# Patient Record
Sex: Female | Born: 1980 | Race: White | Hispanic: No | Marital: Married
Health system: Southern US, Community
[De-identification: ages and names within clinical notes are randomized; demographics above are authoritative.]

## PROBLEM LIST (undated history)

## (undated) DIAGNOSIS — F329 Major depressive disorder, single episode, unspecified: Secondary | ICD-10-CM

## (undated) DIAGNOSIS — T1491XA Suicide attempt, initial encounter: Secondary | ICD-10-CM

## (undated) DIAGNOSIS — F1110 Opioid abuse, uncomplicated: Secondary | ICD-10-CM

## (undated) DIAGNOSIS — B182 Chronic viral hepatitis C: Secondary | ICD-10-CM

## (undated) DIAGNOSIS — F32A Depression, unspecified: Secondary | ICD-10-CM

## (undated) DIAGNOSIS — B1920 Unspecified viral hepatitis C without hepatic coma: Secondary | ICD-10-CM

## (undated) DIAGNOSIS — H539 Unspecified visual disturbance: Secondary | ICD-10-CM

## (undated) DIAGNOSIS — M797 Fibromyalgia: Secondary | ICD-10-CM

## (undated) DIAGNOSIS — F1010 Alcohol abuse, uncomplicated: Secondary | ICD-10-CM

## (undated) DIAGNOSIS — M1990 Unspecified osteoarthritis, unspecified site: Secondary | ICD-10-CM

## (undated) DIAGNOSIS — S32412A Displaced fracture of anterior wall of left acetabulum, initial encounter for closed fracture: Secondary | ICD-10-CM

## (undated) DIAGNOSIS — F419 Anxiety disorder, unspecified: Secondary | ICD-10-CM

## (undated) DIAGNOSIS — I10 Essential (primary) hypertension: Secondary | ICD-10-CM

## (undated) DIAGNOSIS — G8929 Other chronic pain: Secondary | ICD-10-CM

## (undated) HISTORY — PX: ABDOMINAL HYSTERECTOMY: SHX81

## (undated) HISTORY — DX: Unspecified viral hepatitis C without hepatic coma: B19.20

## (undated) HISTORY — DX: Unspecified visual disturbance: H53.9

## (undated) HISTORY — PX: APPENDECTOMY: SHX54

## (undated) HISTORY — PX: KNEE SURGERY: SHX244

## (undated) HISTORY — PX: CHOLECYSTECTOMY: SHX55

---

## 2012-11-18 DIAGNOSIS — M069 Rheumatoid arthritis, unspecified: Secondary | ICD-10-CM | POA: Insufficient documentation

## 2012-11-18 DIAGNOSIS — G43909 Migraine, unspecified, not intractable, without status migrainosus: Secondary | ICD-10-CM | POA: Insufficient documentation

## 2013-03-27 ENCOUNTER — Encounter (HOSPITAL_COMMUNITY): Payer: Self-pay | Admitting: *Deleted

## 2013-03-27 ENCOUNTER — Emergency Department (HOSPITAL_COMMUNITY)
Admission: EM | Admit: 2013-03-27 | Discharge: 2013-03-27 | Disposition: A | Discharge status: Home / Self Care | Payer: Self-pay | Attending: Emergency Medicine | Admitting: Emergency Medicine

## 2013-03-27 DIAGNOSIS — F17200 Nicotine dependence, unspecified, uncomplicated: Secondary | ICD-10-CM | POA: Insufficient documentation

## 2013-03-27 DIAGNOSIS — M549 Dorsalgia, unspecified: Secondary | ICD-10-CM | POA: Insufficient documentation

## 2013-03-27 MED ORDER — HYDROCODONE-ACETAMINOPHEN 5-325 MG PO TABS
1.0000 | ORAL_TABLET | Freq: Once | ORAL | Status: AC
  Administered 2013-03-27: 1 via ORAL
  Filled 2013-03-27: qty 1

## 2013-03-27 MED ORDER — CYCLOBENZAPRINE HCL 10 MG PO TABS
10.0000 mg | ORAL_TABLET | Freq: Once | ORAL | Status: AC
  Administered 2013-03-27: 10 mg via ORAL
  Filled 2013-03-27: qty 1

## 2013-03-27 MED ORDER — CYCLOBENZAPRINE HCL 10 MG PO TABS: 10.0000 mg | ORAL_TABLET | Freq: Two times a day (BID) | ORAL | Status: DC | PRN

## 2013-03-27 MED ORDER — HYDROCODONE-ACETAMINOPHEN 5-325 MG PO TABS: 1.0000 | ORAL_TABLET | ORAL | Status: DC | PRN

## 2013-03-27 NOTE — ED Provider Notes (Signed)
  CSN: [id number]     Arrival date & time 03/27/13  1936 History     First MD Initiated Contact with Patient 03/27/13 2035     No chief complaint on file.  (Consider location/radiation/quality/duration/timing/severity/associated sxs/prior Treatment) Patient is a 32 y.o. female presenting with back pain. The history is provided by the patient. No language interpreter was used.  Back Pain Location:  Thoracic spine Associated symptoms: no abdominal pain, no fever and no numbness   Associated symptoms comment:  Sharp pain in lower thoracic back that woke her from sleep last night. She has a history of spondylosis but current pain is in different location and has different presentation. No known injury. Pain is worse with movement, better with rest.    History reviewed. No pertinent past medical history. Past Surgical History  Procedure Laterality Date  . Cholecystectomy    . Appendectomy     History reviewed. No pertinent family history. History  Substance Use Topics  . Smoking status: Current Every Day Smoker  . Smokeless tobacco: Not on file  . Alcohol Use: Yes   OB History   Grav Para Term Preterm Abortions TAB SAB Ect Mult Living                 Review of Systems  Constitutional: Negative for fever and chills.  Gastrointestinal: Negative.  Negative for abdominal pain.  Musculoskeletal: Positive for back pain.  Skin: Negative.   Neurological: Negative.  Negative for numbness.    Allergies  Erythromycin; Lamictal; and Sumatriptan  Home Medications   Current Outpatient Rx  Name  Route  Sig  Dispense  Refill  . acetaminophen (TYLENOL) 500 MG tablet   Oral   Take 1,000 mg by mouth every 6 (six) hours as needed for pain.         Marland Kitchen ibuprofen (ADVIL,MOTRIN) 200 MG tablet   Oral   Take 400 mg by mouth every 6 (six) hours as needed for pain.          BP 177/100  Pulse 80  Temp(Src) 98.1 F (36.7 C) (Oral)  Resp 16  SpO2 100% Physical Exam  Constitutional: She  appears well-developed and well-nourished.  HENT:  Head: Normocephalic.  Neck: Normal range of motion. Neck supple.  Cardiovascular: Normal rate and regular rhythm.   Pulmonary/Chest: Effort normal and breath sounds normal.  Abdominal: Soft. Bowel sounds are normal. There is no tenderness. There is no rebound and no guarding.  Musculoskeletal: Normal range of motion.  Mild midline thoracic tenderness without swelling or discoloration.  Neurological: She is alert. She has normal reflexes. No cranial nerve deficit. Coordination normal.  Skin: Skin is warm and dry. No rash noted.  Psychiatric: She has a normal mood and affect.    ED Course   Procedures (including critical care time)  Labs Reviewed - No data to display No results found. No diagnosis found. 1. Back pain  MDM  Suspect uncomplicated muscular pain.  Arnoldo Hooker, PA-C 03/27/13 2112

## 2013-03-27 NOTE — ED Notes (Signed)
Pt states that she was stretching her back last night and she woke up this morning in pain in her mid back. Pt ambulatory. Denies problems with urine or bowel.

## 2013-03-28 NOTE — ED Provider Notes (Signed)
Medical screening examination/treatment/procedure(s) were performed by non-physician practitioner and as supervising physician I was immediately available for consultation/collaboration.   [place] Katich H Kendra Woolford, MD 03/28/13 0001 

## 2015-05-29 ENCOUNTER — Observation Stay (HOSPITAL_COMMUNITY)
Admission: EM | Admit: 2015-05-29 | Discharge: 2015-05-31 | Disposition: A | Discharge status: Home / Self Care | Payer: 59 | Attending: Internal Medicine | Admitting: Internal Medicine

## 2015-05-29 ENCOUNTER — Encounter (HOSPITAL_COMMUNITY): Payer: Self-pay | Admitting: *Deleted

## 2015-05-29 DIAGNOSIS — F10239 Alcohol dependence with withdrawal, unspecified: Secondary | ICD-10-CM | POA: Diagnosis not present

## 2015-05-29 DIAGNOSIS — F10930 Alcohol use, unspecified with withdrawal, uncomplicated: Secondary | ICD-10-CM

## 2015-05-29 DIAGNOSIS — B182 Chronic viral hepatitis C: Secondary | ICD-10-CM | POA: Insufficient documentation

## 2015-05-29 DIAGNOSIS — M1990 Unspecified osteoarthritis, unspecified site: Secondary | ICD-10-CM | POA: Diagnosis not present

## 2015-05-29 DIAGNOSIS — F10230 Alcohol dependence with withdrawal, uncomplicated: Secondary | ICD-10-CM

## 2015-05-29 DIAGNOSIS — F17210 Nicotine dependence, cigarettes, uncomplicated: Secondary | ICD-10-CM | POA: Diagnosis not present

## 2015-05-29 DIAGNOSIS — G8929 Other chronic pain: Secondary | ICD-10-CM | POA: Diagnosis not present

## 2015-05-29 DIAGNOSIS — K5900 Constipation, unspecified: Secondary | ICD-10-CM | POA: Insufficient documentation

## 2015-05-29 DIAGNOSIS — Z79891 Long term (current) use of opiate analgesic: Secondary | ICD-10-CM | POA: Diagnosis not present

## 2015-05-29 DIAGNOSIS — M797 Fibromyalgia: Secondary | ICD-10-CM | POA: Diagnosis not present

## 2015-05-29 DIAGNOSIS — E871 Hypo-osmolality and hyponatremia: Secondary | ICD-10-CM | POA: Insufficient documentation

## 2015-05-29 DIAGNOSIS — F10939 Alcohol use, unspecified with withdrawal, unspecified: Secondary | ICD-10-CM | POA: Diagnosis present

## 2015-05-29 HISTORY — DX: Fibromyalgia: M79.7

## 2015-05-29 HISTORY — DX: Other chronic pain: G89.29

## 2015-05-29 HISTORY — DX: Unspecified osteoarthritis, unspecified site: M19.90

## 2015-05-29 HISTORY — DX: Chronic viral hepatitis C: B18.2

## 2015-05-29 LAB — COMPREHENSIVE METABOLIC PANEL
ALT: 264 U/L — ABNORMAL HIGH (ref 14–54)
AST: 172 U/L — ABNORMAL HIGH (ref 15–41)
Albumin: 4 g/dL (ref 3.5–5.0)
Alkaline Phosphatase: 341 U/L — ABNORMAL HIGH (ref 38–126)
Anion gap: 9 (ref 5–15)
BUN: 9 mg/dL (ref 6–20)
CO2: 28 mmol/L (ref 22–32)
Calcium: 9.5 mg/dL (ref 8.9–10.3)
Chloride: 97 mmol/L — ABNORMAL LOW (ref 101–111)
Creatinine, Ser: 0.72 mg/dL (ref 0.44–1.00)
GFR calc Af Amer: >60 mL/min (ref >60)
GFR calc non Af Amer: >60 mL/min (ref >60)
Glucose, Bld: 95 mg/dL (ref 65–99)
Potassium: 4 mmol/L (ref 3.5–5.1)
Sodium: 134 mmol/L — ABNORMAL LOW (ref 135–145)
Total Bilirubin: 0.8 mg/dL (ref 0.3–1.2)
Total Protein: 7.6 g/dL (ref 6.5–8.1)

## 2015-05-29 LAB — CBC WITH DIFFERENTIAL/PLATELET
Basophils Absolute: 0 10*3/uL (ref 0.0–0.1)
Basophils Relative: 0 %
Eosinophils Absolute: 0.3 10*3/uL (ref 0.0–0.7)
Eosinophils Relative: 4 %
HCT: 45.1 % (ref 36.0–46.0)
Hemoglobin: 15.4 g/dL — ABNORMAL HIGH (ref 12.0–15.0)
Lymphocytes Relative: 25 %
Lymphs Abs: 1.4 10*3/uL (ref 0.7–4.0)
MCH: 32.8 pg (ref 26.0–34.0)
MCHC: 34.1 g/dL (ref 30.0–36.0)
MCV: 96 fL (ref 78.0–100.0)
Monocytes Absolute: 0.3 10*3/uL (ref 0.1–1.0)
Monocytes Relative: 6 %
Neutro Abs: 3.7 10*3/uL (ref 1.7–7.7)
Neutrophils Relative %: 65 %
Platelets: 165 10*3/uL (ref 150–400)
RBC: 4.7 MIL/uL (ref 3.87–5.11)
RDW: 12.6 % (ref 11.5–15.5)
WBC: 5.7 10*3/uL (ref 4.0–10.5)

## 2015-05-29 LAB — RAPID URINE DRUG SCREEN, HOSP PERFORMED
Amphetamines: NOT DETECTED
Barbiturates: NOT DETECTED
Benzodiazepines: NOT DETECTED
Cocaine: NOT DETECTED
Opiates: NOT DETECTED
Tetrahydrocannabinol: NOT DETECTED

## 2015-05-29 LAB — ETHANOL: Alcohol, Ethyl (B): <5 mg/dL (ref <5)

## 2015-05-29 MED ORDER — HALOPERIDOL LACTATE 5 MG/ML IJ SOLN
5.0000 mg | Freq: Once | INTRAMUSCULAR | Status: AC
  Administered 2015-05-29: 5 mg via INTRAMUSCULAR
  Filled 2015-05-29: qty 1

## 2015-05-29 MED ORDER — ADULT MULTIVITAMIN W/MINERALS CH
1.0000 | ORAL_TABLET | Freq: Every day | ORAL | Status: DC
Start: 2015-05-29 — End: 2015-05-29

## 2015-05-29 MED ORDER — THIAMINE HCL 100 MG/ML IJ SOLN
Freq: Once | INTRAVENOUS | Status: AC
  Administered 2015-05-29: 18:00:00 via INTRAVENOUS
  Filled 2015-05-29: qty 1000

## 2015-05-29 MED ORDER — LORAZEPAM 2 MG/ML IJ SOLN: 1.0000 mg | Freq: Four times a day (QID) | INTRAMUSCULAR | Status: DC | PRN

## 2015-05-29 MED ORDER — ENOXAPARIN SODIUM 40 MG/0.4ML ~~LOC~~ SOLN
40.0000 mg | SUBCUTANEOUS | Status: DC
  Administered 2015-05-29 – 2015-05-30 (×2): 40 mg via SUBCUTANEOUS
  Filled 2015-05-29 (×2): qty 0.4

## 2015-05-29 MED ORDER — INFLUENZA VAC SPLIT QUAD 0.5 ML IM SUSY
0.5000 mL | PREFILLED_SYRINGE | INTRAMUSCULAR | Status: DC
Start: 2015-05-30 — End: 2015-05-31
  Filled 2015-05-29: qty 0.5

## 2015-05-29 MED ORDER — THIAMINE HCL 100 MG/ML IJ SOLN
100.0000 mg | Freq: Every day | INTRAMUSCULAR | Status: DC
  Administered 2015-05-29: 100 mg via INTRAVENOUS
  Filled 2015-05-29: qty 2

## 2015-05-29 MED ORDER — LORAZEPAM 2 MG/ML IJ SOLN: 1.0000 mg | Freq: Once | INTRAMUSCULAR | Status: AC

## 2015-05-29 MED ORDER — VITAMIN B-1 100 MG PO TABS
100.0000 mg | ORAL_TABLET | Freq: Every day | ORAL | Status: DC
  Administered 2015-05-31: 100 mg via ORAL
  Filled 2015-05-29: qty 1

## 2015-05-29 MED ORDER — NICOTINE 21 MG/24HR TD PT24
21.0000 mg | MEDICATED_PATCH | Freq: Every day | TRANSDERMAL | Status: DC
  Administered 2015-05-29 – 2015-05-31 (×3): 21 mg via TRANSDERMAL
  Filled 2015-05-29 (×3): qty 1

## 2015-05-29 MED ORDER — VITAMIN B-1 100 MG PO TABS
100.0000 mg | ORAL_TABLET | Freq: Every day | ORAL | Status: DC
  Administered 2015-05-31: 100 mg via ORAL

## 2015-05-29 MED ORDER — LORAZEPAM 1 MG PO TABS
1.0000 mg | ORAL_TABLET | Freq: Four times a day (QID) | ORAL | Status: DC | PRN
  Administered 2015-05-29: 1 mg via ORAL
  Filled 2015-05-29: qty 1

## 2015-05-29 MED ORDER — METHADONE HCL 10 MG PO TABS
150.0000 mg | ORAL_TABLET | Freq: Every day | ORAL | Status: DC
  Administered 2015-05-29 – 2015-05-31 (×3): 150 mg via ORAL
  Filled 2015-05-29 (×3): qty 15

## 2015-05-29 MED ORDER — LORAZEPAM 2 MG/ML IJ SOLN
0.0000 mg | Freq: Four times a day (QID) | INTRAMUSCULAR | Status: DC
  Administered 2015-05-29: 4 mg via INTRAVENOUS
  Filled 2015-05-29: qty 2

## 2015-05-29 MED ORDER — HYDRALAZINE HCL 20 MG/ML IJ SOLN: 10.0000 mg | Freq: Four times a day (QID) | INTRAMUSCULAR | Status: DC | PRN

## 2015-05-29 MED ORDER — ADULT MULTIVITAMIN W/MINERALS CH
1.0000 | ORAL_TABLET | Freq: Every day | ORAL | Status: DC
Start: 2015-05-29 — End: 2015-05-31
  Administered 2015-05-29: 1 via ORAL
  Filled 2015-05-29 (×2): qty 1

## 2015-05-29 MED ORDER — ADULT MULTIVITAMIN W/MINERALS CH: 1.0000 | ORAL_TABLET | Freq: Every day | ORAL | Status: DC

## 2015-05-29 MED ORDER — VITAMIN B-1 100 MG PO TABS: 100.0000 mg | ORAL_TABLET | Freq: Every day | ORAL | Status: DC

## 2015-05-29 MED ORDER — ONDANSETRON HCL 4 MG PO TABS
4.0000 mg | ORAL_TABLET | Freq: Four times a day (QID) | ORAL | Status: DC | PRN
Start: 2015-05-29 — End: 2015-05-31

## 2015-05-29 MED ORDER — THIAMINE HCL 100 MG/ML IJ SOLN: 100.0000 mg | Freq: Every day | INTRAMUSCULAR | Status: DC

## 2015-05-29 MED ORDER — LORAZEPAM 2 MG/ML IJ SOLN: 0.0000 mg | Freq: Two times a day (BID) | INTRAMUSCULAR | Status: DC

## 2015-05-29 MED ORDER — ACETAMINOPHEN 325 MG PO TABS
650.0000 mg | ORAL_TABLET | Freq: Four times a day (QID) | ORAL | Status: DC | PRN
  Administered 2015-05-30: 650 mg via ORAL
  Filled 2015-05-29: qty 2

## 2015-05-29 MED ORDER — ONDANSETRON HCL 4 MG/2ML IJ SOLN: 4.0000 mg | Freq: Four times a day (QID) | INTRAMUSCULAR | Status: DC | PRN

## 2015-05-29 MED ORDER — LORAZEPAM 1 MG PO TABS: 1.0000 mg | ORAL_TABLET | Freq: Four times a day (QID) | ORAL | Status: DC | PRN

## 2015-05-29 MED ORDER — FOLIC ACID 1 MG PO TABS: 1.0000 mg | ORAL_TABLET | Freq: Every day | ORAL | Status: DC

## 2015-05-29 MED ORDER — FOLIC ACID 1 MG PO TABS
1.0000 mg | ORAL_TABLET | Freq: Every day | ORAL | Status: DC
  Administered 2015-05-31: 1 mg via ORAL
  Filled 2015-05-29: qty 1

## 2015-05-29 MED ORDER — LORAZEPAM 0.5 MG PO TABS
2.0000 mg | ORAL_TABLET | Freq: Once | ORAL | Status: AC
Start: 2015-05-29 — End: 2015-05-29
  Administered 2015-05-29: 2 mg via ORAL
  Filled 2015-05-29: qty 4

## 2015-05-29 MED ORDER — ACETAMINOPHEN 650 MG RE SUPP: 650.0000 mg | Freq: Four times a day (QID) | RECTAL | Status: DC | PRN

## 2015-05-29 MED ORDER — FOLIC ACID 1 MG PO TABS
1.0000 mg | ORAL_TABLET | Freq: Every day | ORAL | Status: DC
  Administered 2015-05-29: 1 mg via ORAL
  Filled 2015-05-29: qty 1

## 2015-05-29 NOTE — H&P (Signed)
[facility] History and Physical  Carla Park BJY:[id number] DOB: 01-Apr-1981 DOA: 05/29/2015  Referring physician: Ms. Trixie Dredge, PA EDP PCP: No primary care provider on file.  Specialists:   Chief Complaint: Alcohol withdrawal  HPI: Carla Park is a 34 y.o. female  With a history of chronic pain for which she goes to methadone clinic for detox, hepatitis C, who presented for alcohol withdrawal and delirium tremens. Patient states that on daily basis she typically drinks 9 glasses of wine or 2-3 500 mL boxes of wine.  Her last drink was this past Saturday, 05/26/2015.  She went to her methadone clinic today for treatment and was told that she could not be treated due to the possibility of seizure and was sent to the emergency department. Patient does express her desire to continue treatment through the clinic and to detox from alcohol. She did not realize that her alcohol consumption  "had gotten out of hand".  Currently patient does admit to feeling very anxious, restless, fidgety. She denies any chest pain, shortness of breath, nausea, vomiting or diarrhea or abdominal pain. Patient has not been having any hallucinations or confusion. She does complain of some constipation due to her methadone use. Reports her last bowel movement was yesterday. In the emergency department, patient's tox screen was negative, alcohol level was less than 5. Vital signs were normal except for elevated blood pressure. Patient was given several doses of Ativan. [facility] called for admission.  Review of Systems:  Constitutional: Denies fever, chills, diaphoresis, appetite change and fatigue.  HEENT: Denies photophobia, eye pain, redness, hearing loss, ear pain, congestion, sore throat, rhinorrhea, sneezing, mouth sores, trouble swallowing, neck pain, neck stiffness and tinnitus.   Respiratory: Denies SOB, DOE, cough, chest tightness,  and wheezing.   Cardiovascular: Denies chest pain, palpitations and leg  swelling.  Gastrointestinal: Complains of constipation. Genitourinary: Denies dysuria, urgency, frequency, hematuria, flank pain and difficulty urinating.  Musculoskeletal: Complains of painful arthritis in all of her joints. Skin: Denies pallor, rash and wound.  Neurological: Feels nervous and anxious, shaky, has tremors and headaches.  Hematological: Denies adenopathy. Easy bruising, personal or family bleeding history  Psychiatric/Behavioral: Feels shaky. Feels nervous  Past Medical History  Diagnosis Date  . Chronic pain   . Arthritis   . Fibromyalgia   . Hep C w/ coma, chronic (HCC)    Past Surgical History  Procedure Laterality Date  . Cholecystectomy    . Appendectomy    . Knee surgery     Social History:  reports that she has been smoking Cigarettes.  She has been smoking about 1.00 pack per day. She has never used smokeless tobacco. She reports that she drinks about 3.6 oz of alcohol per week. She reports that she does not use illicit drugs.   Allergies  Allergen Reactions  . Erythromycin Hives  . Lamictal [Lamotrigine] Dermatitis  . Sumatriptan Other (See Comments)    Becomes angry    Family History  Problem Relation Age of Onset  . Multiple sclerosis Mother     Prior to Admission medications   Medication Sig Start Date End Date Taking? Authorizing Provider  cyclobenzaprine (FLEXERIL) 10 MG tablet Take 1 tablet (10 mg total) by mouth 2 (two) times daily as needed for muscle spasms. 03/27/13   Elpidio Anis, PA-C  HYDROcodone-acetaminophen (NORCO/VICODIN) 5-325 MG per tablet Take 1 tablet by mouth every 4 (four) hours as needed for pain. 03/27/13   Elpidio Anis, PA-C  ibuprofen (ADVIL,MOTRIN) 200 MG tablet Take  400 mg by mouth every 6 (six) hours as needed for pain.    Historical Provider, MD   Physical Exam: Filed Vitals:   05/29/15 1542  BP: 131/78  Pulse: 74  Temp:   Resp: 20     General: Well developed, well nourished, NAD, appears stated  age  HEENT: NCAT, PERRLA, EOMI, Anicteic Sclera, mucous membranes moist.   Neck: Supple, no JVD, no masses  Cardiovascular: S1 S2 auscultated, no rubs, murmurs or gallops. Regular rate and rhythm.  Respiratory: Clear to auscultation bilaterally with equal chest rise  Abdomen: Soft, nontender, nondistended, + bowel sounds  Extremities: warm dry without cyanosis clubbing or edema  Neuro: AAOx3, cranial nerves grossly intact. Strength 5/5 in patient's upper and lower extremities bilaterally  Skin: Without rashes exudates or nodules, multiple tattoos  Psych: Anxious but pleasant  Labs on Admission:  Basic Metabolic Panel:  Recent Labs Lab 05/29/15 1519  NA 134*  K 4.0  CL 97*  CO2 28  GLUCOSE 95  BUN 9  CREATININE 0.72  CALCIUM 9.5   Liver Function Tests:  Recent Labs Lab 05/29/15 1519  AST 172*  ALT 264*  ALKPHOS 341*  BILITOT 0.8  PROT 7.6  ALBUMIN 4.0   No results for input(s): LIPASE, AMYLASE in the last 168 hours. No results for input(s): AMMONIA in the last 168 hours. CBC:  Recent Labs Lab 05/29/15 1519  WBC 5.7  NEUTROABS 3.7  HGB 15.4*  HCT 45.1  MCV 96.0  PLT 165   Cardiac Enzymes: No results for input(s): CKTOTAL, CKMB, CKMBINDEX, TROPONINI in the last 168 hours.  BNP (last 3 results) No results for input(s): BNP in the last 8760 hours.  ProBNP (last 3 results) No results for input(s): PROBNP in the last 8760 hours.  CBG: No results for input(s): GLUCAP in the last 168 hours.  Radiological Exams on Admission: No results found.  EKG: None  Assessment/Plan  Acute alcohol withdrawal -Will admit patient to medical floor for observation -Conduct neuro checks -Place on CIWA protocol, banana bag, followed by multivitamin, folic acid, thiamine -Alcohol level <5, tox screen negative -Alcohol cessation discussed  Accelerated hypertension -Will place on hydralazine -Likely complicated by withdrawal and pain  Chronic  pain/Fibromyalgia/Arthritis -Patient follows with the methadone clinic -Continue methadone 150 mg daily  Hepatitis C with elevated LFTs -Continue to monitor CMP -Patient currently does not have history of cirrhosis  Tobacco abuse -smoking cessation discussed -Will order nicotine patch  Mild hyponatremia -Continue to monitor CMP, IV fluids  Constipation -Likely secondary to methadone -Patient declined stool softener at this time  DVT prophylaxis: Lovenox  Code Status: Full  Condition: Guarded  Family Communication: None at bedside. Admission, patients condition and plan of care including tests being ordered have been discussed with the patient, who indicates understanding and agrees with the plan and Code Status.  Disposition Plan: Admitted for observation   Time spent: 60 minutes  Junior Huezo D.O. [facility] Pager [phone]  If 7PM-7AM, please contact night-coverage [email or web address] Password [email or web address] 05/29/2015, 4:29 PM

## 2015-05-29 NOTE — ED Notes (Signed)
PT requesting  Detox from ETOH

## 2015-05-29 NOTE — ED Notes (Signed)
UNABLE TO DRAW BLOOD FROM IV. PHLEBOTOMY REQUESTED.

## 2015-05-29 NOTE — ED Notes (Signed)
PT reports tremors from ETOH withdrawal . Pt reports she last had a drink was 2 days ago. Pt daily red wine intake wine boxes . Pt drinks 3 boxes a day. Pt has been sent to ED by the Methadone clinic for detox . The clinic did not dose today. Pt has been taking Methadone for 1 2/2 years.

## 2015-05-29 NOTE — ED Provider Notes (Signed)
CSN: [id number]     Arrival date & time 05/29/15  1204 History  By signing my name below, I, Essence Howell, attest that this documentation has been prepared under the direction and in the presence of Trixie Dredge, PA-C Electronically Signed: Charline Bills, ED Scribe 05/29/2015 at 3:45 PM.   CC: Alcohol withdrawl The history is provided by the patient. No language interpreter was used.   HPI Comments: Carla Park is a 34 y.o. female who presents to the Emergency Department for alcohol detox. Pt states that she typically drinks 2-3 500 mL wine boxes daily. Her last drink was 3 days ago when she had 2 500 mL boxes of wine. Pt states that she has been treated at the Methadone clinic for detox for the past 1.5 years. She went today for a Methadone treatment but was told that she could not be treated out-patient due to possibility of seizure. Her last dose of methadone was yesterday. Pt expresses her desire to continue treatment at the clinic and reports that she wants to detox from alcohol. Pt also reports withdrawal symptoms described as tremors, hot flashes, increased anxiety, 5/10 HA, fidgety and restlessness, unchanged chronic pain. She denies nausea, itching, burning, numbness/tingling, hallucinations, confusion.  Past Medical History  Diagnosis Date  . Chronic pain   . Arthritis   . Fibromyalgia   . Hep C w/ coma, chronic (HCC)    Past Surgical History  Procedure Laterality Date  . Cholecystectomy    . Appendectomy    . Knee surgery     History reviewed. No pertinent family history. Social History  Substance Use Topics  . Smoking status: Current Every Day Smoker -- 1.00 packs/day    Types: Cigarettes  . Smokeless tobacco: Never Used  . Alcohol Use: 3.6 oz/week    6 Glasses of wine per week     Comment: 500 ml boxes    OB History    No data available     Review of Systems  Constitutional: Positive for diaphoresis.  Gastrointestinal: Negative for nausea.  Neurological:  Positive for tremors and headaches. Negative for numbness.  Psychiatric/Behavioral: Negative for hallucinations and confusion. The patient is nervous/anxious.   All other systems reviewed and are negative.  Allergies  Erythromycin; Lamictal; and Sumatriptan  Home Medications   Prior to Admission medications   Medication Sig Start Date End Date Taking? Authorizing Provider  acetaminophen (TYLENOL) 500 MG tablet Take 1,000 mg by mouth every 6 (six) hours as needed for pain.    Historical Provider, MD  cyclobenzaprine (FLEXERIL) 10 MG tablet Take 1 tablet (10 mg total) by mouth 2 (two) times daily as needed for muscle spasms. 03/27/13   Elpidio Anis, PA-C  HYDROcodone-acetaminophen (NORCO/VICODIN) 5-325 MG per tablet Take 1 tablet by mouth every 4 (four) hours as needed for pain. 03/27/13   Elpidio Anis, PA-C  ibuprofen (ADVIL,MOTRIN) 200 MG tablet Take 400 mg by mouth every 6 (six) hours as needed for pain.    Historical Provider, MD   BP 187/100 mmHg  Pulse 68  Temp(Src) 98.3 F (36.8 C) (Oral)  Resp 20  Ht 5\' 2"  (1.575 m)  Wt 110 lb (49.896 kg)  BMI 20.11 kg/m2  SpO2 99% Physical Exam  Constitutional: She appears well-developed and well-nourished. No distress.  HENT:  Head: Normocephalic and atraumatic.  Neck: Neck supple.  Cardiovascular: Normal rate and regular rhythm.   Pulmonary/Chest: Effort normal and breath sounds normal. No respiratory distress. She has no wheezes. She has no rales.  Abdominal: Soft. She exhibits no distension. There is no tenderness. There is no rebound and no guarding.  Neurological: She is alert. She displays tremor.  Skin: She is not diaphoretic.  Psychiatric: Her mood appears anxious.  Nursing note and vitals reviewed.  ED Course  Procedures (including critical care time) DIAGNOSTIC STUDIES: Oxygen Saturation is 99% on RA, normal by my interpretation.    COORDINATION OF CARE: 12:57 PM-Discussed treatment plan which includes Ativan with pt at  bedside and pt agreed to plan.   Labs Review Labs Reviewed  COMPREHENSIVE METABOLIC PANEL - Abnormal; Notable for the following:    Sodium 134 (*)    Chloride 97 (*)    AST 172 (*)    ALT 264 (*)    Alkaline Phosphatase 341 (*)    All other components within normal limits  CBC WITH DIFFERENTIAL/PLATELET - Abnormal; Notable for the following:    Hemoglobin 15.4 (*)    All other components within normal limits  URINE RAPID DRUG SCREEN, HOSP PERFORMED  ETHANOL   Imaging Review No results found. I have personally reviewed and evaluated these images and lab results as part of my medical decision-making.   EKG Interpretation None       3:51 PM Checked on patient, still feeling very shaky and anxious.  Will redose ativan.  Is now on CIWA protocol.    MDM   Final diagnoses:  Alcohol withdrawal, uncomplicated (HCC)    Patient with alcohol dependence sent to ED from methadone clinic by her doctor for help with withdrawal.  CIWA score calculated at 15 by RN, 18 by me.  Per protocol and given patient's symptoms, plan for admission to hospital for ETOH withdrawal.  Labs significant for LFT elevation.  Admitted to [facility] as unassigned admission, Dr Catha Gosselin accepts admission.     I personally performed the services described in this documentation, which was scribed in my presence. The recorded information has been reviewed and is accurate.    Trixie Dredge, PA-C 05/29/15 1624  Medical screening examination/treatment/procedure(s) were performed by non-physician practitioner and as supervising physician I was immediately available for consultation/collaboration.   EKG Interpretation None        Melene Plan, DO 05/30/15 [phone]

## 2015-05-29 NOTE — Progress Notes (Signed)
Pt report received at 4:37pm from [facility]. Asked for repeat CIWA score before sending Pt to floor. RN called back at 4:50pm with updated CIWA score of 7.

## 2015-05-29 NOTE — Progress Notes (Signed)
Pt arrived to floor at 4:54pm

## 2015-05-29 NOTE — ED Notes (Signed)
Internal medicine at bedside

## 2015-05-30 DIAGNOSIS — G8929 Other chronic pain: Secondary | ICD-10-CM | POA: Diagnosis not present

## 2015-05-30 DIAGNOSIS — F10230 Alcohol dependence with withdrawal, uncomplicated: Secondary | ICD-10-CM | POA: Diagnosis not present

## 2015-05-30 DIAGNOSIS — B182 Chronic viral hepatitis C: Secondary | ICD-10-CM

## 2015-05-30 DIAGNOSIS — M797 Fibromyalgia: Secondary | ICD-10-CM | POA: Diagnosis not present

## 2015-05-30 DIAGNOSIS — F10231 Alcohol dependence with withdrawal delirium: Secondary | ICD-10-CM | POA: Diagnosis not present

## 2015-05-30 LAB — CBC
HCT: 44.4 % (ref 36.0–46.0)
Hemoglobin: 14.7 g/dL (ref 12.0–15.0)
MCH: 32.6 pg (ref 26.0–34.0)
MCHC: 33.1 g/dL (ref 30.0–36.0)
MCV: 98.4 fL (ref 78.0–100.0)
Platelets: 149 10*3/uL — ABNORMAL LOW (ref 150–400)
RBC: 4.51 MIL/uL (ref 3.87–5.11)
RDW: 12.9 % (ref 11.5–15.5)
WBC: 5.7 10*3/uL (ref 4.0–10.5)

## 2015-05-30 LAB — COMPREHENSIVE METABOLIC PANEL
ALT: 218 U/L — ABNORMAL HIGH (ref 14–54)
AST: 145 U/L — ABNORMAL HIGH (ref 15–41)
Albumin: 3.4 g/dL — ABNORMAL LOW (ref 3.5–5.0)
Alkaline Phosphatase: 272 U/L — ABNORMAL HIGH (ref 38–126)
Anion gap: 11 (ref 5–15)
BUN: 11 mg/dL (ref 6–20)
CO2: 27 mmol/L (ref 22–32)
Calcium: 9.4 mg/dL (ref 8.9–10.3)
Chloride: 100 mmol/L — ABNORMAL LOW (ref 101–111)
Creatinine, Ser: 0.79 mg/dL (ref 0.44–1.00)
GFR calc Af Amer: >60 mL/min (ref >60)
GFR calc non Af Amer: >60 mL/min (ref >60)
Glucose, Bld: 82 mg/dL (ref 65–99)
Potassium: 4 mmol/L (ref 3.5–5.1)
Sodium: 138 mmol/L (ref 135–145)
Total Bilirubin: 0.4 mg/dL (ref 0.3–1.2)
Total Protein: 6.5 g/dL (ref 6.5–8.1)

## 2015-05-30 MED ORDER — THIAMINE HCL 100 MG/ML IJ SOLN
Freq: Once | INTRAVENOUS | Status: AC
  Administered 2015-05-30: 10:00:00 via INTRAVENOUS
  Filled 2015-05-30: qty 1000

## 2015-05-30 NOTE — Progress Notes (Signed)
Patient no longer requiring Recruitment consultant. Sitter dc'd at this time. 05/30/2015 11:27 AM Carla Park

## 2015-05-30 NOTE — Progress Notes (Signed)
Patient was fidgiting and her routine lorazepan was administered at 2125. She was encouraged to sleep and give the medication a chance to work. Not long after medication was administered, patient got out of her bed, pulled her PIV that was started a few minutes earlier out. Walked out of her room and stated that, she was going home and she has just got off from work. On assessment, she was bleeding from the site. Pressure dressing was immediately applied to the site and she was re-directed to her room. She was reoriented to place and time. Patient was cleaned and helped to bed. Patient tried to sleep but become agitated and attempted to jump out of the bed with her eyes closed. Staff who were present at the bedside held patient and prevented her from falling. Patient began to hallucinate by speaking to individuals who were not present in the room and responding to their conversations. Staff woke patient up, laid her back in bed but patient will not stay in the bed. She continue to attempt to get out of bed. Roma Kayser Schorr, NP was notified and she ordered haldol 5 mg IM due to lack of IV access and patients agitation which has made it impossible to restart another IV. Medication was administered immediately and staff stayed with patient and offer support. Patients husband was notified and he called, spoke to patient and encouraged her to lay down and go to sleep. Patient was able to have a short conversation with her husband but fell asleep during the conversation then woke up and attempted to jump out of the bed. Patients husband was still on the phone at that time and he was asked if he could come and see patient but he indicated that, he has his son with him and that was going to make it difficult for him to come. Charge nurse and staff decided not to add extra burden to the family and husband was asked not to come. Staff stayed with patient till she fell asleep and comfort sitter come-in later to be with  patient.

## 2015-05-30 NOTE — Progress Notes (Signed)
[facility] PROGRESS NOTE  Carla Park QQV:[id number] DOB: March 21, 1981 DOA: 05/29/2015 PCP: No primary care provider on file.  HPI/Brief narrative Pt presented with a history of chronic pain for which she goes to methadone clinic for detox, hepatitis C, who presented for alcohol withdrawal and delirium tremens.  Assessment/Plan: Acute alcohol withdrawal -Conduct neuro checks -On CIWA protocol, banana bag, followed by multivitamin, folic acid, thiamine -Presenting alcohol level <5, tox screen negative -Alcohol cessation was reportedly discussed on admission  Accelerated hypertension -Continued on hydralazine -Better controlled -Likely fluctuations in BP complicated by withdrawal and pain  Chronic pain/Fibromyalgia/Arthritis -Patient follows with the methadone clinic -Continue methadone 150 mg daily  Hepatitis C with elevated LFTs -Continue to monitor CMP -LFT's slightly improved this AM -Patient currently does not have history of cirrhosis  Tobacco abuse -smoking cessation discussed -Will order nicotine patch  Mild hyponatremia -Resolved -Continue to monitor CMP, IV fluids  Constipation -Likely secondary to methadone -Patient declined stool softener at admission  Code Status: Full Family Communication: Pt in room Disposition Plan: Pending   Consultants:    Procedures:    Antibiotics: Anti-infectives    None       HPI/Subjective: Confused this AM  Objective: Filed Vitals:   05/30/15 0000 05/30/15 0400 05/30/15 0554 05/30/15 1419  BP:   123/81 135/88  Pulse: 80 74 64 71  Temp:   98.4 F (36.9 C) 97.7 F (36.5 C)  TempSrc:   Oral Oral  Resp:   18 16  Height:      Weight:      SpO2:   100% 98%    Intake/Output Summary (Last 24 hours) at 05/30/15 1422 Last data filed at 05/30/15 0900  Gross per 24 hour  Intake    360 ml  Output    600 ml  Net   -240 ml   Filed Weights   05/29/15 1218 05/29/15 1658  Weight: 49.896 kg (110 lb)  52.3 kg (115 lb 4.8 oz)    Exam:   General:  Asleep, in nad  Cardiovascular: regular, s1, s2  Respiratory: normal resp effort, no wheezing  Abdomen: soft,nondistended  Musculoskeletal: perfused, no clubbing   Data Reviewed: Basic Metabolic Panel:  Recent Labs Lab 05/29/15 1519 05/30/15 0625  NA 134* 138  K 4.0 4.0  CL 97* 100*  CO2 28 27  GLUCOSE 95 82  BUN 9 11  CREATININE 0.72 0.79  CALCIUM 9.5 9.4   Liver Function Tests:  Recent Labs Lab 05/29/15 1519 05/30/15 0625  AST 172* 145*  ALT 264* 218*  ALKPHOS 341* 272*  BILITOT 0.8 0.4  PROT 7.6 6.5  ALBUMIN 4.0 3.4*   No results for input(s): LIPASE, AMYLASE in the last 168 hours. No results for input(s): AMMONIA in the last 168 hours. CBC:  Recent Labs Lab 05/29/15 1519 05/30/15 0625  WBC 5.7 5.7  NEUTROABS 3.7  --   HGB 15.4* 14.7  HCT 45.1 44.4  MCV 96.0 98.4  PLT 165 149*   Cardiac Enzymes: No results for input(s): CKTOTAL, CKMB, CKMBINDEX, TROPONINI in the last 168 hours. BNP (last 3 results) No results for input(s): BNP in the last 8760 hours.  ProBNP (last 3 results) No results for input(s): PROBNP in the last 8760 hours.  CBG: No results for input(s): GLUCAP in the last 168 hours.  No results found for this or any previous visit (from the past 240 hour(s)).   Studies: No results found.  Scheduled Meds: . enoxaparin (LOVENOX) injection  40  mg Subcutaneous Q24H  . folic acid  1 mg Oral Daily  . Influenza vac split quadrivalent PF  0.5 mL Intramuscular Tomorrow-1000  . LORazepam  0-4 mg Intravenous Q6H   Followed by  . [START ON 05/31/2015] LORazepam  0-4 mg Intravenous Q12H  . methadone  150 mg Oral Daily  . multivitamin with minerals  1 tablet Oral Daily  . nicotine  21 mg Transdermal Daily  . thiamine  100 mg Oral Daily   Or  . thiamine  100 mg Intravenous Daily  . thiamine  100 mg Oral Daily   Continuous Infusions:   Principal Problem:   Alcohol withdrawal  (HCC) Active Problems:   Chronic pain   Fibromyalgia   Hep C w/ coma, chronic (HCC)    Carla Park K  [facility] Pager [phone]. If 7PM-7AM, please contact night-coverage at [email or web address], password [facility] 05/30/2015, 2:22 PM

## 2015-05-31 DIAGNOSIS — M797 Fibromyalgia: Secondary | ICD-10-CM | POA: Diagnosis not present

## 2015-05-31 DIAGNOSIS — F10231 Alcohol dependence with withdrawal delirium: Secondary | ICD-10-CM

## 2015-05-31 DIAGNOSIS — G8929 Other chronic pain: Secondary | ICD-10-CM | POA: Diagnosis not present

## 2015-05-31 DIAGNOSIS — B182 Chronic viral hepatitis C: Secondary | ICD-10-CM | POA: Diagnosis not present

## 2015-05-31 LAB — COMPREHENSIVE METABOLIC PANEL
ALT: 216 U/L — ABNORMAL HIGH (ref 14–54)
AST: 169 U/L — ABNORMAL HIGH (ref 15–41)
Albumin: 3.1 g/dL — ABNORMAL LOW (ref 3.5–5.0)
Alkaline Phosphatase: 222 U/L — ABNORMAL HIGH (ref 38–126)
Anion gap: 11 (ref 5–15)
BUN: 12 mg/dL (ref 6–20)
CO2: 26 mmol/L (ref 22–32)
Calcium: 9.3 mg/dL (ref 8.9–10.3)
Chloride: 101 mmol/L (ref 101–111)
Creatinine, Ser: 0.92 mg/dL (ref 0.44–1.00)
GFR calc Af Amer: >60 mL/min (ref >60)
GFR calc non Af Amer: >60 mL/min (ref >60)
Glucose, Bld: 135 mg/dL — ABNORMAL HIGH (ref 65–99)
Potassium: 3.9 mmol/L (ref 3.5–5.1)
Sodium: 138 mmol/L (ref 135–145)
Total Bilirubin: 0.5 mg/dL (ref 0.3–1.2)
Total Protein: 6.1 g/dL — ABNORMAL LOW (ref 6.5–8.1)

## 2015-05-31 NOTE — Progress Notes (Signed)
Pt DC'd to home ambulated to lobby with NT- husband picking her up.  IV removed. DC instructions given to PT. Belongings returned to pt. Skin intact.

## 2015-05-31 NOTE — Discharge Summary (Signed)
Physician Discharge Summary  Carla Park [facility]:[id number] DOB: Jan 29, 1981 DOA: 05/29/2015  PCP: No primary care provider on file.  Admit date: 05/29/2015 Discharge date: 05/31/2015  Time spent: 20 minutes  Recommendations for Outpatient Follow-up:  1. Establish and follow up with PCP in 1-2 weeks 2. Follow up with methadone clinic  Discharge Diagnoses:  Principal Problem:   Alcohol withdrawal (HCC) Active Problems:   Chronic pain   Fibromyalgia   Hep C w/ coma, chronic (HCC)  Discharge Condition: Improved   Diet recommendation: Regular diet  Filed Weights   05/29/15 1218 05/29/15 1658  Weight: 49.896 kg (110 lb) 52.3 kg (115 lb 4.8 oz)    History of present illness:  Please see admit h and p from 10/11 for details. Briefly, pt with a history of chronic pain for which she goes to methadone clinic for detox, hepatitis C, who presented for alcohol withdrawal and delirium tremens. Patient was admitted for further work up.  Hospital Course:  Acute alcohol withdrawal -Conduct neuro checks -On CIWA protocol, banana bag, followed by multivitamin, folic acid, thiamine -Presenting alcohol level <5, tox screen negative -Alcohol cessation was done on multiple occasions -Patient declines information regarding alcoholic programs and states she already has a program she is affiliated with  Accelerated hypertension -Continued on hydralazine -Better controlled -Likely fluctuations in BP complicated by withdrawal and pain  Chronic pain/Fibromyalgia/Arthritis -Patient follows with the methadone clinic -Continued methadone 150 mg daily  Hepatitis C with elevated LFTs -Continue to monitor CMP -LFT's steadily improved this admission -Patient currently does not have history of cirrhosis  Tobacco abuse -smoking cessation discussed -Given nicotine patch  Mild hyponatremia -Likely secondary to ETOH abuse -Resolved  Constipation -Likely secondary to methadone -Patient declined  stool softener at admission   Discharge Exam: Filed Vitals:   05/30/15 2131 05/30/15 2350 05/31/15 0531 05/31/15 0837  BP: 155/76 151/95 158/94 101/56  Pulse: 64 67 68 69  Temp:  98.9 F (37.2 C) 98.4 F (36.9 C) 98.2 F (36.8 C)  TempSrc:  Oral Oral Oral  Resp:  18 16 15   Height:      Weight:      SpO2:  97% 98% 97%    General: awake, in nad Cardiovascular: regular, s1, s2 Respiratory: normal resp effort, no wheezing  Discharge Instructions     Medication List    STOP taking these medications        cyclobenzaprine 10 MG tablet  Commonly known as:  FLEXERIL     HYDROcodone-acetaminophen 5-325 MG tablet  Commonly known as:  NORCO/VICODIN      TAKE these medications        methadone 10 MG/ML solution  Commonly known as:  DOLOPHINE  Take 150 mg by mouth daily.     multivitamin with minerals Tabs tablet  Take 1 tablet by mouth 3 (three) times daily.     NASACORT ALLERGY 24HR 55 MCG/ACT Aero nasal inhaler  Generic drug:  triamcinolone  Place 2 sprays into the nose daily.       Allergies  Allergen Reactions  . Erythromycin Hives  . Lamictal [Lamotrigine] Dermatitis  . Sumatriptan Other (See Comments)    Becomes angry   Follow-up Information    Schedule an appointment as soon as possible for a visit with Follow up with PCP in 1-2 weeks.   Why:  Hospital follow up       The results of significant diagnostics from this hospitalization (including imaging, microbiology, ancillary and laboratory) are listed below for  reference.    Significant Diagnostic Studies: No results found.  Microbiology: No results found for this or any previous visit (from the past 240 hour(s)).   Labs: Basic Metabolic Panel:  Recent Labs Lab 05/29/15 1519 05/30/15 0625 05/31/15 0650  NA 134* 138 138  K 4.0 4.0 3.9  CL 97* 100* 101  CO2 28 27 26   GLUCOSE 95 82 135*  BUN 9 11 12   CREATININE 0.72 0.79 0.92  CALCIUM 9.5 9.4 9.3   Liver Function Tests:  Recent  Labs Lab 05/29/15 1519 05/30/15 0625 05/31/15 0650  AST 172* 145* 169*  ALT 264* 218* 216*  ALKPHOS 341* 272* 222*  BILITOT 0.8 0.4 0.5  PROT 7.6 6.5 6.1*  ALBUMIN 4.0 3.4* 3.1*   No results for input(s): LIPASE, AMYLASE in the last 168 hours. No results for input(s): AMMONIA in the last 168 hours. CBC:  Recent Labs Lab 05/29/15 1519 05/30/15 0625  WBC 5.7 5.7  NEUTROABS 3.7  --   HGB 15.4* 14.7  HCT 45.1 44.4  MCV 96.0 98.4  PLT 165 149*   Cardiac Enzymes: No results for input(s): CKTOTAL, CKMB, CKMBINDEX, TROPONINI in the last 168 hours. BNP: BNP (last 3 results) No results for input(s): BNP in the last 8760 hours.  ProBNP (last 3 results) No results for input(s): PROBNP in the last 8760 hours.  CBG: No results for input(s): GLUCAP in the last 168 hours.   Signed:  CHIU, 07/29/15  [facility] 05/31/2015, 5:12 PM

## 2015-06-16 ENCOUNTER — Emergency Department (HOSPITAL_COMMUNITY)
Admission: EM | Admit: 2015-06-16 | Discharge: 2015-06-16 | Disposition: A | Discharge status: Home / Self Care | Payer: 59 | Attending: Emergency Medicine | Admitting: Emergency Medicine

## 2015-06-16 ENCOUNTER — Encounter (HOSPITAL_COMMUNITY): Payer: Self-pay | Admitting: Emergency Medicine

## 2015-06-16 DIAGNOSIS — R110 Nausea: Secondary | ICD-10-CM | POA: Diagnosis not present

## 2015-06-16 DIAGNOSIS — Z7951 Long term (current) use of inhaled steroids: Secondary | ICD-10-CM | POA: Diagnosis not present

## 2015-06-16 DIAGNOSIS — R51 Headache: Secondary | ICD-10-CM | POA: Diagnosis not present

## 2015-06-16 DIAGNOSIS — Z79891 Long term (current) use of opiate analgesic: Secondary | ICD-10-CM | POA: Insufficient documentation

## 2015-06-16 DIAGNOSIS — Z720 Tobacco use: Secondary | ICD-10-CM | POA: Diagnosis not present

## 2015-06-16 DIAGNOSIS — F10930 Alcohol use, unspecified with withdrawal, uncomplicated: Secondary | ICD-10-CM

## 2015-06-16 DIAGNOSIS — F419 Anxiety disorder, unspecified: Secondary | ICD-10-CM | POA: Insufficient documentation

## 2015-06-16 DIAGNOSIS — M1990 Unspecified osteoarthritis, unspecified site: Secondary | ICD-10-CM | POA: Insufficient documentation

## 2015-06-16 DIAGNOSIS — F10230 Alcohol dependence with withdrawal, uncomplicated: Secondary | ICD-10-CM | POA: Insufficient documentation

## 2015-06-16 DIAGNOSIS — R251 Tremor, unspecified: Secondary | ICD-10-CM | POA: Diagnosis not present

## 2015-06-16 DIAGNOSIS — G8929 Other chronic pain: Secondary | ICD-10-CM | POA: Diagnosis not present

## 2015-06-16 DIAGNOSIS — R443 Hallucinations, unspecified: Secondary | ICD-10-CM | POA: Insufficient documentation

## 2015-06-16 DIAGNOSIS — Z8619 Personal history of other infectious and parasitic diseases: Secondary | ICD-10-CM | POA: Diagnosis not present

## 2015-06-16 LAB — RAPID URINE DRUG SCREEN, HOSP PERFORMED
Amphetamines: NOT DETECTED
Barbiturates: NOT DETECTED
Benzodiazepines: NOT DETECTED
Cocaine: NOT DETECTED
Opiates: NOT DETECTED
Tetrahydrocannabinol: NOT DETECTED

## 2015-06-16 LAB — CBC WITH DIFFERENTIAL/PLATELET
Basophils Absolute: 0 10*3/uL (ref 0.0–0.1)
Basophils Relative: 0 %
Eosinophils Absolute: 0.1 10*3/uL (ref 0.0–0.7)
Eosinophils Relative: 2 %
HCT: 45.4 % (ref 36.0–46.0)
Hemoglobin: 15.9 g/dL — ABNORMAL HIGH (ref 12.0–15.0)
Lymphocytes Relative: 22 %
Lymphs Abs: 1.3 10*3/uL (ref 0.7–4.0)
MCH: 33.1 pg (ref 26.0–34.0)
MCHC: 35 g/dL (ref 30.0–36.0)
MCV: 94.4 fL (ref 78.0–100.0)
Monocytes Absolute: 0.4 10*3/uL (ref 0.1–1.0)
Monocytes Relative: 7 %
Neutro Abs: 4.1 10*3/uL (ref 1.7–7.7)
Neutrophils Relative %: 69 %
Platelets: 111 10*3/uL — ABNORMAL LOW (ref 150–400)
RBC: 4.81 MIL/uL (ref 3.87–5.11)
RDW: 12.5 % (ref 11.5–15.5)
WBC: 6 10*3/uL (ref 4.0–10.5)

## 2015-06-16 LAB — ETHANOL: Alcohol, Ethyl (B): <5 mg/dL (ref <5)

## 2015-06-16 MED ORDER — LORAZEPAM 1 MG PO TABS
1.0000 mg | ORAL_TABLET | Freq: Once | ORAL | Status: AC
  Administered 2015-06-16: 1 mg via ORAL
  Filled 2015-06-16: qty 1

## 2015-06-16 MED ORDER — ACETAMINOPHEN 325 MG PO TABS
325.0000 mg | ORAL_TABLET | Freq: Once | ORAL | Status: AC
  Administered 2015-06-16: 325 mg via ORAL
  Filled 2015-06-16: qty 1

## 2015-06-16 MED ORDER — ADULT MULTIVITAMIN W/MINERALS CH: 1.0000 | ORAL_TABLET | Freq: Three times a day (TID) | ORAL | Status: DC

## 2015-06-16 MED ORDER — VITAMIN B-1 100 MG PO TABS: 100.0000 mg | ORAL_TABLET | Freq: Every day | ORAL | Status: DC

## 2015-06-16 MED ORDER — CARBAMAZEPINE 200 MG PO TABS: ORAL_TABLET | ORAL | Status: DC

## 2015-06-16 MED ORDER — FOLIC ACID 1 MG PO TABS: 1.0000 mg | ORAL_TABLET | Freq: Every day | ORAL | Status: DC

## 2015-06-16 NOTE — Care Management Note (Signed)
Case Management Note  Patient Details  Name: Zakya Halabi MRN: [id number] Date of Birth: [birth year]  Subjective/Objective:  34 y.o. F currently seeking "Detox for Alcohol" until she can make it to her out pt appt on Monday with her Psychologist. She reports she is taking 150 mg Methadone Daily. Reported to PA-c that husband will not allow her to come back home. CM talked with her to assess her willingness to go to a Facility that could provide detox for both Alcohol and Methadone which she is resistant to at Present. Made CSW aware of need for referral as pt does not meet criteria at present for Obs/Inpatient admission at present. Clarified with both [facility] in [place] [phone]) and [facility] [place], [place] ([phone])- Neither accept pts on Methadone doses > 40 mg Daily.                     Action/Plan: Will be available for disposition and discharge needs. CSW aware and following.    Expected Discharge Date:                  Expected Discharge Plan:     In-House Referral:  Clinical Social Work  Discharge planning Services  CM Consult  Post Acute Care Choice:    Choice offered to:  Patient  DME Arranged:    DME Agency:     HH Arranged:    HH Agency:     Status of Service:  In process, will continue to follow  Medicare Important Message Given:    Date Medicare IM Given:    Medicare IM give by:    Date Additional Medicare IM Given:    Additional Medicare Important Message give by:     If discussed at Long Length of Stay Meetings, dates discussed:    Additional Comments:  Yvone Neu, RN 06/16/2015, 11:23 AM

## 2015-06-16 NOTE — Discharge Instructions (Signed)
Alcohol Withdrawal When a person who drinks a lot of alcohol stops drinking, he or she may go through alcohol withdrawal. Alcohol withdrawal causes problems. It can make you feel:  Tired (fatigued).  Sad (depressed).  Fearful (anxious).  Grouchy (irritable).  Not hungry.  Sick to your stomach (nauseous).  Shaky. It can also make you have:  Nightmares.  Trouble sleeping.  Trouble thinking clearly.  Mood swings.  Clammy skin.  Very bad sweating.  A very fast heartbeat.  Shaking that you cannot control (tremor).  Having a fever.  A fit of movements that you cannot control (seizure).  Confusion.  Throwing up (vomiting).  Feeling or seeing things that are not there (hallucinations). HOME CARE  Take medicines and vitamins only as told by your doctor.  Do not drink alcohol.  Have someone around in case you need help.  Drink enough fluid to keep your pee (urine) clear or pale yellow.  Think about joining a group to help you stop drinking. GET HELP IF:  Your problems get worse.  Your problems do not go away.  You cannot eat or drink without throwing up.  You are having a hard time not drinking alcohol.  You cannot stop drinking alcohol. GET HELP RIGHT AWAY IF:  You feel your heart beating differently than usual.  Your chest hurts.  You have trouble breathing.  You have very bad problems, like:  A fever.  A fit of movements that you cannot control.  Being very confused.  Feeling or seeing things that are not there.   This information is not intended to replace advice given to you by your health care provider. Make sure you discuss any questions you have with your health care provider.   Document Released: 01/21/2008 Document Revised: 08/25/2014 Document Reviewed: 05/23/2014 Elsevier Interactive Patient Education [employer]   Please use medication as directed, please follow-up with resources provided. If any new or worsening signs  or symptoms present please return to the emergency room for further evaluation and management.

## 2015-06-16 NOTE — ED Provider Notes (Signed)
CSN: [id number]     Arrival date & time 06/16/15  9379 History   First MD Initiated Contact with Patient 06/16/15 0902     Chief Complaint  Patient presents with  . Addiction Problem    HPI   34 year old female presents today for alcohol withdrawal. Patient reports she is an alcoholic, drinks proximately 1500 mL of wine daily. Patient reports that on 05/29/2015 she was seen in the emergency room for alcohol detox as recommended by her methadone clinic. She was CWAl of 15/18, was admitted for 2 days. She reports abstinence for approximately 1 week, but was unable to maintain sobriety. Patient reports difficulty with being surrounded by alcohol everwhere she goes. She had not followed up with outpatient treatment, reports that she has a appointment next week. At this time of evaluation patient reports that she's having mild headache, mild tremors, no nausea, vomiting, hallucinations, shortness of breath or chest pain, no abdominal pain, no diaphoresis. She does report increased anxiety, chronic pain, slight pins and needle sensation.  Patient reports that her husband informed her that she needed to "pack a bag and go to the hospital" and that he did not expect her to come home today. During my evaluation she reported that he was upset that she would not be staying at the hospital for detox. She states that she would like to have somewhere she could stay until Monday so that she doesn't have the urge to drink until she can see her care provider for medical management.   Past Medical History  Diagnosis Date  . Chronic pain   . Arthritis   . Fibromyalgia   . Hep C w/ coma, chronic (HCC)    Past Surgical History  Procedure Laterality Date  . Cholecystectomy    . Appendectomy    . Knee surgery    . Abdominal hysterectomy    . Cesarean section     Family History  Problem Relation Age of Onset  . Multiple sclerosis Mother    Social History  Substance Use Topics  . Smoking status: Current  Every Day Smoker -- 1.00 packs/day    Types: Cigarettes  . Smokeless tobacco: Never Used  . Alcohol Use: 3.6 oz/week    6 Glasses of wine per week     Comment: 500 ml boxes    OB History    No data available     Review of Systems  All other systems reviewed and are negative.   Allergies  Erythromycin; Lamictal; and Sumatriptan  Home Medications   Prior to Admission medications   Medication Sig Start Date End Date Taking? Authorizing Provider  methadone (DOLOPHINE) 10 MG/ML solution Take 150 mg by mouth daily.   Yes Historical Provider, MD  triamcinolone (NASACORT ALLERGY 24HR) 55 MCG/ACT AERO nasal inhaler Place 2 sprays into the nose daily.   Yes Historical Provider, MD  carbamazepine (TEGRETOL) 200 MG tablet 868m PO QD X 1D, then 6050mPO QD X 1D, then 40035mD X 1D, then 200m57m QD X 2D 06/16/15   JeffOkey Regal-C  folic acid (FOLVITE) 1 MG tablet Take 1 tablet (1 mg total) by mouth daily. 06/16/15   JeffOkey Regal-C  Multiple Vitamin (MULTIVITAMIN WITH MINERALS) TABS tablet Take 1 tablet by mouth 3 (three) times daily. 06/16/15   JeffOkey Regal-C  thiamine (VITAMIN B-1) 100 MG tablet Take 1 tablet (100 mg total) by mouth daily. 06/16/15   Carla Valbuena, PA-C   BP 157/95 mmHg  Pulse  65  Temp(Src) 98.8 F (37.1 C) (Oral)  Resp 16  Ht _0  (1.575 m)  Wt 120 lb (54.432 kg)  BMI 21.94 kg/m2  SpO2 98%   Physical Exam  Constitutional: She is oriented to person, place, and time. She appears well-developed and well-nourished. She appears distressed.  Anxious   HENT:  Head: Normocephalic and atraumatic.  Eyes: Conjunctivae are normal. Pupils are equal, round, and reactive to light. Right eye exhibits no discharge. Left eye exhibits no discharge. No scleral icterus.  Neck: Normal range of motion. No JVD present. No tracheal deviation present.  Cardiovascular: Normal rate, regular rhythm and intact distal pulses.  Exam reveals no gallop and no friction rub.    No murmur heard. Pulmonary/Chest: Effort normal and breath sounds normal. No stridor. No respiratory distress. She has no wheezes. She has no rales. She exhibits no tenderness.  Abdominal: Soft. There is no tenderness.  Neurological: She is alert and oriented to person, place, and time. She has normal strength. No cranial nerve deficit or sensory deficit. Coordination normal. GCS eye subscore is 4. GCS verbal subscore is 5. GCS motor subscore is 6.  Slight tremors   Skin: Skin is warm and dry. No rash noted. She is not diaphoretic. No erythema. No pallor.  Psychiatric: She has a normal mood and affect. Her behavior is normal. Judgment and thought content normal.  Nursing note and vitals reviewed.   ED Course  Procedures (including critical care time) Labs Review Labs Reviewed  CBC WITH DIFFERENTIAL/PLATELET - Abnormal; Notable for the following:    Hemoglobin 15.9 (*)    Platelets 111 (*)    All other components within normal limits  ETHANOL  URINE RAPID DRUG SCREEN, HOSP PERFORMED    Imaging Review No results found. I have personally reviewed and evaluated these images and lab results as part of my medical decision-making.   EKG Interpretation None      MDM   Final diagnoses:  Alcohol withdrawal, uncomplicated (HCC)    Labs: CBC, ethanol, CMP, urine rapid drug screen- no significant findings  Imaging:  Consults:CSW, CM  Therapeutics: Ativan  Discharge Meds: Folic acid, thiamine, multivitamin, carbamazepine  Assessment/Plan: Patient presents to the ED for alcohol detoxification. Patient reports last drink was yesterday, she has minimal symptoms on initial evaluation. She reports that her husband would like her admitted to the hospital for alcohol detoxification so that she may follow-up with outpatient services on Monday. Patient reports that she is also taking methadone, and reports that she does not want to stop taking this in order to go into alcohol treatment. She  reports the treatment facility that she would like to go to will not take her if she is in active withdrawal, she refused to go to any other facilities as they will not allow her to contiue taking methadone. Patient reports that "I'm not ready to give up methadone". Pt requesting to be held until Monday so that she doesn't have the urge to drink. Pt had an initial CIWA of 12 upon initial evaluation. She was given ativan in the ED with CIWA of 8, repeat CIWA of 10. Pt was recently admitted for severe withdrawals on the 11'th of this month, she did not follow up with outpatient resources. She does not have severe withdrawal symptoms throughout her stay here in the ED, her vital signs are reassuring. Pt will be given Carbamazepine taper as she has a history of substance abuse and currently taking methadone. She met with both  CM and CSW and was given numerous outpatient resources for follow up care. Pt is stable for discharge and understands the strict return precautions in the event worsening signs or symptoms present. She was in sound mind and verbalized her understanding and agreement to today's plan.           Okey Regal, PA-C 06/16/15 2047  Harvel Quale, MD 06/16/15 [phone]

## 2015-06-16 NOTE — ED Notes (Signed)
Daily ETOH user, states she is here to safely detox from ETOH. Last drink yesterday.

## 2015-06-16 NOTE — Social Work (Signed)
CSW met with patient and spoke with patient's husband on speakerphone while in the room. Patient stated that she came to ED to detox from alcohol because she has an appointment on Monday with her primary doctor in order to start medication to help with cravings. Patient states that she is concerned that she will use again if she goes home. CSW encouraged patient to utilize her community and personal supports to maintain abstinence. CSW shared this same information with patient's husband over the phone at patient's request. At the time I met with the physician he identified that patient did not meet criteria for detox admission. This CSW provided long list of inpatient and outpatient SA treatment and detox options. Husband stated that he gets out of work at [employer] and can pick up patient then.  Christene Lye MSW, [place]

## 2015-06-16 NOTE — ED Notes (Signed)
Patient had questions about prescriptions and about why she can't stay for detox. Notified PA Hedges. He went back to room to discuss with her.

## 2015-06-18 DIAGNOSIS — F1120 Opioid dependence, uncomplicated: Secondary | ICD-10-CM | POA: Diagnosis present

## 2016-02-07 ENCOUNTER — Ambulatory Visit (INDEPENDENT_AMBULATORY_CARE_PROVIDER_SITE_OTHER): Payer: Self-pay | Admitting: Urgent Care

## 2016-02-07 VITALS — BP 126/80 | HR 70 | Temp 98.1°F | Resp 16 | Ht 63.5 in | Wt 123.0 lb

## 2016-02-07 DIAGNOSIS — F1911 Other psychoactive substance abuse, in remission: Secondary | ICD-10-CM

## 2016-02-07 DIAGNOSIS — Z87898 Personal history of other specified conditions: Secondary | ICD-10-CM

## 2016-02-07 DIAGNOSIS — Z5181 Encounter for therapeutic drug level monitoring: Secondary | ICD-10-CM

## 2016-02-07 NOTE — Patient Instructions (Addendum)
Methadone tablets What is this medicine? METHADONE (METH a done) is a pain reliever. It is used to treat severe pain. The medicine is also used to prevent withdrawal symptoms in people addicted to other drugs. This medicine may be used for other purposes; ask your health care provider or pharmacist if you have questions. What should I tell my health care provider before I take this medicine? They need to know if you have any of these conditions: -adrenal gland problem (Addison's disease) -brain tumor -drug abuse or addiction -fast or irregular heartbeat -gallbladder disease -head injury -frequently drink alcohol-containing drinks -kidney disease or problems going to the bathroom -liver disease -low blood pressure -lung disease, asthma, COPD, or sleep apnea -mental problems -seizure disorder -thyroid disease -an unusual or allergic reaction to methadone, other opioid analgesics, other medicines, foods, dyes, or preservatives -pregnant or trying to get pregnant -breast-feeding How should I use this medicine? Take this medicine by mouth with a drink of water. If the medicine upsets your stomach, take it with food or milk. Follow the directions on the prescription label. Do not take more medicine than you are told to take. A special MedGuide will be given to you by the pharmacist with each prescription and refill. Be sure to read this information carefully each time. Talk to your pediatrician regarding the use of this medicine in children. Special care may be needed. Overdosage: If you think you have taken too much of this medicine contact a poison control center or emergency room at once. NOTE: This medicine is only for you. Do not share this medicine with others. What if I miss a dose? If you miss a dose, take it as soon as you can. If it is almost time for your next dose, take only that dose. Do not take double or extra doses. What may interact with this medicine? Do not take this  medicine with any of the following medications: -arsenic trioxide -certain antibiotics like clarithromycin, erythromycin, grepafloxacin, pentamidine, sparfloxacin, troleandomycin -certain medicines for fungal infections like fluconazole, itraconazole, ketoconazole, posaconazole, voriconazole -certain medicines for irregular heart beat like amiodarone, bretylium, disopyramide, dofetilide, dronedarone, procainamide, quinidine, sotalol -certain medicines for malaria like chloroquine, halofantrine -cisapride -droperidol -haloperidol -pimozide -ranolazine -rasagiline -selegiline -sertindole -thioridazine -ziprasidone This medicine may also interact with the following medications: -alcohol -alfuzosin -antihistamines for allergy, cough and cold -antiviral medicines for HIV or AIDS -certain antibiotics like gatifloxacin, gemifloxacin, levofloxacin, moxifloxacin, ofloxacin, telithromycin, rifampin, rifapentine -certain medicines for blood pressure -certain medicines for cancer like dasatinib, lapatinib, sunitinib -certain medicines for depression, anxiety, or psychotic disturbances -certain medicines for irregular heart beat like flecainide, propafenone -certain medicines for nausea or vomiting like dolasetron, ondansetron, palonosetron -certain medicines for seizures like carbamazepine, phenobarbital, phenytoin -certain medicines for sleep -certain medicines for sleep during surgery -certain medicines to numb the skin -desipramine -MAOIs like Carbex, Eldepryl, Marplan, Nardil, and Parnate -mefloquine -muscle relaxants -narcotic medicines for pain -octreotide -other medicines that prolong the QT interval (cause an abnormal heart rhythm) -peginterferon alfa-2b -phenothiazines like chlorpromazine, mesoridazine, prochlorperazine -St. John's wort -tacrolimus -tramadol -vardenafil -vorinostat This list may not describe all possible interactions. Give your health care provider a list of  all the medicines, herbs, non-prescription drugs, or dietary supplements you use. Also tell them if you smoke, drink alcohol, or use illegal drugs. Some items may interact with your medicine. What should I watch for while using this medicine? Tell your doctor or health care professional if your pain does not go away, if it gets worse,  or if you have new or a different type of pain. You may develop tolerance to the medicine. Tolerance means that you will need a higher dose of the medicine for pain relief. Tolerance is normal and is expected if you take this medicine for a long time. Talk to your family and the people you live with about the side effects of this medicine. Tell them to get you medical help right away if you are having trouble breathing, unusually loud snoring, or are too sleepy. Do not suddenly stop taking your medicine because you may develop a severe reaction. Your body becomes used to the medicine. This does NOT mean you are addicted. Addiction is a behavior related to getting and using a drug for a non-medical reason. If you have pain, you have a medical reason to take pain medicine. Your doctor will tell you how much medicine to take. If your doctor wants you to stop the medicine, the dose will be slowly lowered over time to avoid any side effects. You may get drowsy or dizzy when you first start taking this medicine or change doses. Do not drive, use machinery, or do anything that may be dangerous until you know how the medicine affects you. Stand or sit up slowly. There are different types of narcotic medicines (opiates) for pain. If you take more than one type at the same time, you may have more side effects. Give your health care provider a list of all medicines you use. Your doctor will tell you how much medicine to take. Do not take more medicine than directed. Call emergency for help if you have problems breathing. This medicine will cause constipation. Try to have a bowel movement  at least every 2 to 3 days. If you do not have a bowel movement for 3 days, call your doctor or health care professional. Your mouth may get dry. Chewing sugarless gum or sucking hard candy, and drinking plenty of water may help. Contact your doctor if the problem does not go away or is severe. What side effects may I notice from receiving this medicine? Side effects that you should report to your doctor or health care professional as soon as possible: -allergic reactions like skin rash, itching or hives, swelling of the face, lips, or tongue -breathing problems -chest pain -confusion -feeling faint or lightheaded, falls -hallucinations -loud snoring -unusually fast or slow heartbeat -unusually weak or tired Side effects that usually do not require medical attention (report to your doctor or health care professional if they continue or are bothersome): -nausea, vomiting -sweating This list may not describe all possible side effects. Call your doctor for medical advice about side effects. You may report side effects to FDA at 1-800-FDA-1088. Where should I keep my medicine? Keep out of the reach of children. This medicine can be abused. Keep your medicine in a safe place to protect it from theft. Do not share this medicine with anyone. Selling or giving away this medicine is dangerous and is against the law. Store at room temperature between 15 and 30 degrees C (59 and 86 degrees F). Keep container tightly closed. This medicine may cause accidental overdose and death if it is taken by other adults, children, or pets. Flush any unused medicine down the toilet to reduce the chance of harm. Do not use the medicine after the expiration date. NOTE: This sheet is a summary. It may not cover all possible information. If you have questions about this medicine, talk to your doctor,  pharmacist, or health care provider.    2016, [employer]. (2013-03-08 11:44:21)     IF you received an  x-ray today, you will receive an invoice from [facility]. Please contact [facility] at [phone] with questions or concerns regarding your invoice.   IF you received labwork today, you will receive an invoice from [employer]. Please contact [facility] at [phone] with questions or concerns regarding your invoice.   Our billing staff will not be able to assist you with questions regarding bills from these companies.  You will be contacted with the lab results as soon as they are available. The fastest way to get your results is to activate your My Chart account. Instructions are located on the last page of this paperwork. If you have not heard from [place] regarding the results in 2 weeks, please contact this office.

## 2016-02-07 NOTE — Progress Notes (Signed)
    MRN: [id number] DOB: 11-17-1980  Subjective:   Carla Park is a 35 y.o. female presenting for chief complaint of Need [facility] for methadone clinic  Patient is presenting to have an EKG performed for a methadone clinic. Denies chest pain, shob, heart palpitations. Her physician managing her methadone requested she have this done due to a seminar he completed suggesting she needed to have her ECG performed. Smokes 1ppd.  Ilynn has a current medication list which includes the following prescription(s): methadone and multivitamin with minerals. Also is allergic to erythromycin; lamictal; and sumatriptan.  Shabree  has a past medical history of Chronic pain; Arthritis; Fibromyalgia; and Hep C w/ coma, chronic (HCC). Also  has past surgical history that includes Cholecystectomy; Appendectomy; Knee surgery; Abdominal hysterectomy; and Cesarean section.  Objective:   Vitals: BP 148/74 mmHg  Pulse 70  Temp(Src) 98.1 F (36.7 C) (Oral)  Resp 16  Ht 5' 3.5" (1.613 m)  Wt 123 lb (55.792 kg)  BMI 21.44 kg/m2  SpO2 99%  Physical Exam  Constitutional: She is oriented to person, place, and time. She appears well-developed and well-nourished.  Cardiovascular: Normal rate, regular rhythm and intact distal pulses.  Exam reveals no gallop and no friction rub.   No murmur heard. Pulmonary/Chest: No respiratory distress. She has no wheezes. She has no rales.  Neurological: She is alert and oriented to person, place, and time.  Skin: Skin is warm and dry.  Psychiatric: She has a normal mood and affect.   ECG interpretation - normal sinus rhythm.  Assessment and Plan :   1. Medication monitoring encounter 2. History of substance abuse - ECG and physical exam is very reassuring. RTC as needed.  Wallis Bamberg, PA-C [facility] [phone] 02/07/2016 1:19 PM

## 2016-10-14 ENCOUNTER — Encounter (HOSPITAL_COMMUNITY): Payer: Self-pay | Admitting: Emergency Medicine

## 2016-10-14 ENCOUNTER — Emergency Department (HOSPITAL_COMMUNITY)
Admission: EM | Admit: 2016-10-14 | Discharge: 2016-10-14 | Disposition: A | Discharge status: Home / Self Care | Payer: Self-pay | Attending: Emergency Medicine | Admitting: Emergency Medicine

## 2016-10-14 ENCOUNTER — Emergency Department (HOSPITAL_COMMUNITY): Payer: Self-pay

## 2016-10-14 DIAGNOSIS — N12 Tubulo-interstitial nephritis, not specified as acute or chronic: Secondary | ICD-10-CM | POA: Insufficient documentation

## 2016-10-14 DIAGNOSIS — Z79899 Other long term (current) drug therapy: Secondary | ICD-10-CM | POA: Insufficient documentation

## 2016-10-14 DIAGNOSIS — F17210 Nicotine dependence, cigarettes, uncomplicated: Secondary | ICD-10-CM | POA: Insufficient documentation

## 2016-10-14 LAB — URINALYSIS, ROUTINE W REFLEX MICROSCOPIC
Bilirubin Urine: NEGATIVE
Glucose, UA: NEGATIVE mg/dL
Ketones, ur: NEGATIVE mg/dL
Nitrite: NEGATIVE
Protein, ur: 100 mg/dL — AB
Specific Gravity, Urine: 1.014 (ref 1.005–1.030)
pH: 5 (ref 5.0–8.0)

## 2016-10-14 LAB — LIPASE, BLOOD: Lipase: 24 U/L (ref 11–51)

## 2016-10-14 LAB — COMPREHENSIVE METABOLIC PANEL
ALT: 313 U/L — ABNORMAL HIGH (ref 14–54)
AST: 291 U/L — ABNORMAL HIGH (ref 15–41)
Albumin: 3.8 g/dL (ref 3.5–5.0)
Alkaline Phosphatase: 171 U/L — ABNORMAL HIGH (ref 38–126)
Anion gap: 11 (ref 5–15)
BUN: 12 mg/dL (ref 6–20)
CO2: 25 mmol/L (ref 22–32)
Calcium: 9.3 mg/dL (ref 8.9–10.3)
Chloride: 97 mmol/L — ABNORMAL LOW (ref 101–111)
Creatinine, Ser: 1.01 mg/dL — ABNORMAL HIGH (ref 0.44–1.00)
GFR calc Af Amer: >60 mL/min (ref >60)
GFR calc non Af Amer: >60 mL/min (ref >60)
Glucose, Bld: 143 mg/dL — ABNORMAL HIGH (ref 65–99)
Potassium: 4.1 mmol/L (ref 3.5–5.1)
Sodium: 133 mmol/L — ABNORMAL LOW (ref 135–145)
Total Bilirubin: 1.6 mg/dL — ABNORMAL HIGH (ref 0.3–1.2)
Total Protein: 7.2 g/dL (ref 6.5–8.1)

## 2016-10-14 LAB — CBC
HCT: 44.9 % (ref 36.0–46.0)
Hemoglobin: 15.3 g/dL — ABNORMAL HIGH (ref 12.0–15.0)
MCH: 31.2 pg (ref 26.0–34.0)
MCHC: 34.1 g/dL (ref 30.0–36.0)
MCV: 91.4 fL (ref 78.0–100.0)
Platelets: 141 10*3/uL — ABNORMAL LOW (ref 150–400)
RBC: 4.91 MIL/uL (ref 3.87–5.11)
RDW: 13.1 % (ref 11.5–15.5)
WBC: 13.6 10*3/uL — ABNORMAL HIGH (ref 4.0–10.5)

## 2016-10-14 LAB — PREGNANCY, URINE: Preg Test, Ur: NEGATIVE

## 2016-10-14 MED ORDER — AMOXICILLIN-POT CLAVULANATE 875-125 MG PO TABS: 1.0000 | ORAL_TABLET | Freq: Two times a day (BID) | ORAL | 0 refills | Status: DC

## 2016-10-14 MED ORDER — SODIUM CHLORIDE 0.9 % IV BOLUS (SEPSIS)
1000.0000 mL | Freq: Once | INTRAVENOUS | Status: AC
  Administered 2016-10-14: 1000 mL via INTRAVENOUS

## 2016-10-14 MED ORDER — ONDANSETRON HCL 4 MG/2ML IJ SOLN
4.0000 mg | Freq: Once | INTRAMUSCULAR | Status: AC | PRN
  Administered 2016-10-14: 4 mg via INTRAVENOUS
  Filled 2016-10-14: qty 2

## 2016-10-14 MED ORDER — IOPAMIDOL (ISOVUE-300) INJECTION 61%
INTRAVENOUS | Status: AC
  Administered 2016-10-14: 100 mL
  Filled 2016-10-14: qty 100

## 2016-10-14 MED ORDER — CEFTRIAXONE SODIUM 1 G IJ SOLR
1.0000 g | Freq: Once | INTRAMUSCULAR | Status: AC
  Administered 2016-10-14: 1 g via INTRAVENOUS
  Filled 2016-10-14: qty 10

## 2016-10-14 MED ORDER — ONDANSETRON 4 MG PO TBDP: 4.0000 mg | ORAL_TABLET | Freq: Three times a day (TID) | ORAL | 0 refills | Status: DC | PRN

## 2016-10-14 MED ORDER — NAPROXEN 250 MG PO TABS: 250.0000 mg | ORAL_TABLET | Freq: Two times a day (BID) | ORAL | 0 refills | Status: DC

## 2016-10-14 MED ORDER — ACETAMINOPHEN 325 MG PO TABS
650.0000 mg | ORAL_TABLET | Freq: Once | ORAL | Status: AC
  Administered 2016-10-14: 650 mg via ORAL
  Filled 2016-10-14: qty 2

## 2016-10-14 NOTE — ED Notes (Signed)
Pt returned from ct

## 2016-10-14 NOTE — ED Provider Notes (Signed)
[facility] DEPT Provider Note   CSN: [id number] Arrival date & time: 10/14/16  0277     History   Chief Complaint Chief Complaint  Patient presents with  . Flank Pain    HPI Carla Park is a 36 y.o. female.  Carla Park is a 36 y.o. Female with a history of substance abuse and chronic pain who presents to the ED complaining of UTI symptoms starting two days ago and bilateral low back pain starting yesterday. Patient reports she had pain with urination and dark urine starting 2 days ago. She reports a fever 101 yesterday with associated nausea, vomiting and bilateral low back pain. She reports the pain made her vomit. She reports taking Tylenol prior to arrival today. She denies history of recent UTIs. She denies history of previous pyelonephritis. She denies history of kidney stones. She takes methadone for her history of substance abuse. She reports vomiting the methadone up this morning. She denies coughing, chest pain, shortness of breath, abdominal pain, diarrhea, vaginal bleeding, or rashes.   The history is provided by the patient and medical records. No language interpreter was used.  Flank Pain  Pertinent negatives include no chest pain, no abdominal pain, no headaches and no shortness of breath.    Past Medical History:  Diagnosis Date  . Arthritis   . Chronic pain   . Fibromyalgia   . Hep C w/ coma, chronic [facility])     Patient Active Problem List   Diagnosis Date Noted  . Alcohol withdrawal (HCC) 05/29/2015  . Chronic pain   . Fibromyalgia   . Hep C w/ coma, chronic (HCC)     Past Surgical History:  Procedure Laterality Date  . ABDOMINAL HYSTERECTOMY    . APPENDECTOMY    . CESAREAN SECTION    . CHOLECYSTECTOMY    . KNEE SURGERY      OB History    No data available       Home Medications    Prior to Admission medications   Medication Sig Start Date End Date Taking? Authorizing Provider  acetaminophen (TYLENOL) 325 MG tablet Take 650 mg by  mouth every 6 (six) hours as needed for mild pain.   Yes Historical Provider, MD  ibuprofen (ADVIL,MOTRIN) 200 MG tablet Take 200 mg by mouth every 6 (six) hours as needed for moderate pain.   Yes Historical Provider, MD  methadone (DOLOPHINE) 10 MG/ML solution Take 150 mg by mouth daily.   Yes Historical Provider, MD  amoxicillin-clavulanate (AUGMENTIN) 875-125 MG tablet Take 1 tablet by mouth every 12 (twelve) hours. 10/14/16   Everlene Farrier, PA-C  Multiple Vitamin (MULTIVITAMIN WITH MINERALS) TABS tablet Take 1 tablet by mouth 3 (three) times daily. Patient not taking: Reported on 10/14/2016 06/16/15   Eyvonne Mechanic, PA-C  naproxen (NAPROSYN) 250 MG tablet Take 1 tablet (250 mg total) by mouth 2 (two) times daily with a meal. 10/14/16   Everlene Farrier, PA-C  ondansetron (ZOFRAN ODT) 4 MG disintegrating tablet Take 1 tablet (4 mg total) by mouth every 8 (eight) hours as needed for nausea or vomiting. 10/14/16   Everlene Farrier, PA-C    Family History Family History  Problem Relation Age of Onset  . Multiple sclerosis Mother     Social History Social History  Substance Use Topics  . Smoking status: Current Every Day Smoker    Packs/day: 1.00    Types: Cigarettes  . Smokeless tobacco: Never Used  . Alcohol use 3.6 oz/week    6 Glasses  of wine per week     Comment: 500 ml boxes      Allergies   Erythromycin; Lamictal [lamotrigine]; and Sumatriptan   Review of Systems Review of Systems  Constitutional: Positive for chills and fever.  HENT: Negative for congestion and sore throat.   Eyes: Negative for visual disturbance.  Respiratory: Negative for cough, shortness of breath and wheezing.   Cardiovascular: Negative for chest pain.  Gastrointestinal: Positive for nausea and vomiting. Negative for abdominal pain, blood in stool and diarrhea.  Genitourinary: Positive for dysuria, flank pain and hematuria. Negative for difficulty urinating, frequency, menstrual problem, pelvic pain,  vaginal bleeding and vaginal discharge.  Musculoskeletal: Positive for back pain. Negative for neck pain.  Skin: Negative for rash.  Neurological: Negative for dizziness, light-headedness and headaches.     Physical Exam Updated Vital Signs BP 123/82   Pulse 63   Temp 99.3 F (37.4 C) (Oral)   Resp 18   SpO2 100%   Physical Exam  Constitutional: She appears well-developed and well-nourished. No distress.  Nontoxic appearing.  HENT:  Head: Normocephalic and atraumatic.  Mouth/Throat: Oropharynx is clear and moist.  Eyes: Conjunctivae are normal. Pupils are equal, round, and reactive to light. Right eye exhibits no discharge. Left eye exhibits no discharge.  Neck: Neck supple.  Cardiovascular: Normal rate, regular rhythm, normal heart sounds and intact distal pulses.  Exam reveals no gallop and no friction rub.   No murmur heard. Pulmonary/Chest: Effort normal and breath sounds normal. No respiratory distress. She has no wheezes. She has no rales.  Abdominal: Soft. Bowel sounds are normal. She exhibits no distension and no mass. There is no tenderness. There is no rebound and no guarding.  Abdomen is soft and nontender to palpation. Bowel sounds are present. No CVA or flank tenderness. Patient has tenderness to her bilateral low back musculature. No midline neck or back tenderness.  Musculoskeletal: Normal range of motion. She exhibits tenderness. She exhibits no edema or deformity.  Patient has tenderness to her bilateral low back musculature. No CVA or flank tenderness. No midline neck or back tenderness. No overlying skin changes to her back.  Lymphadenopathy:    She has no cervical adenopathy.  Neurological: She is alert. No sensory deficit. Coordination normal.  Skin: Skin is warm and dry. Capillary refill takes less than 2 seconds. No rash noted. She is not diaphoretic. No erythema. No pallor.  Psychiatric: She has a normal mood and affect. Her behavior is normal.  Nursing  note and vitals reviewed.    ED Treatments / Results  Labs (all labs ordered are listed, but only abnormal results are displayed) Labs Reviewed  COMPREHENSIVE METABOLIC PANEL - Abnormal; Notable for the following:       Result Value   Sodium 133 (*)    Chloride 97 (*)    Glucose, Bld 143 (*)    Creatinine, Ser 1.01 (*)    AST 291 (*)    ALT 313 (*)    Alkaline Phosphatase 171 (*)    Total Bilirubin 1.6 (*)    All other components within normal limits  CBC - Abnormal; Notable for the following:    WBC 13.6 (*)    Hemoglobin 15.3 (*)    Platelets 141 (*)    All other components within normal limits  URINALYSIS, ROUTINE W REFLEX MICROSCOPIC - Abnormal; Notable for the following:    Color, Urine AMBER (*)    APPearance CLOUDY (*)    Hgb urine dipstick LARGE (*)  Protein, ur 100 (*)    Leukocytes, UA MODERATE (*)    Bacteria, UA MANY (*)    Squamous Epithelial / LPF 0-5 (*)    Non Squamous Epithelial 0-5 (*)    All other components within normal limits  URINE CULTURE  LIPASE, BLOOD  PREGNANCY, URINE    EKG  EKG Interpretation None       Radiology Ct Abdomen Pelvis W Contrast  Result Date: 10/14/2016 CLINICAL DATA:  Lower abdominal pain, bilateral flank pain nausea and vomiting, dark urine for about 2 days EXAM: CT ABDOMEN AND PELVIS WITH CONTRAST TECHNIQUE: Multidetector CT imaging of the abdomen and pelvis was performed using the standard protocol following bolus administration of intravenous contrast. CONTRAST:  ISOVUE-300 IOPAMIDOL (ISOVUE-300) INJECTION 61% COMPARISON:  None. FINDINGS: Lower chest: Lung bases shows no acute findings. Hepatobiliary: There is mild intrahepatic biliary ductal dilatation probable postcholecystectomy. Mild fatty infiltration of the liver. No focal hepatic mass. Mild CBD dilatation measures about 8 mm probable postcholecystectomy. No CBD filling defects are noted Pancreas: Unremarkable. No pancreatic ductal dilatation or surrounding  inflammatory changes. Spleen: Normal in size without focal abnormality. Adrenals/Urinary Tract: No adrenal gland mass. The kidneys show symmetrical enhancement. There is triangular-shaped area of decreased attenuation in upper pole of the right kidney medially please see coronal image 91. Similar small area are noted in upper pole of the right kidney laterally axial image 31. Also area of decreased attenuation lower pole of the right kidney coronal image 81. There is area of decreased attenuation in lower pole of the left kidney anteriorly axial image 40. Small second area of decreased attenuation in lower pole of the left kidney laterally axial image 39. Small focal area of decreased attenuation in upper pole of the left kidney medially axial image 29. Findings are consistent with bilateral multifocal pyelonephritis. There is subtle mild enhancement of the proximal right ureter. Superimpose right retracted infection/inflammation cannot be excluded. No hydronephrosis or hydroureter. Stomach/Bowel: No gastric outlet obstruction. Mild fluid distended small bowel loops with some fluid in gas in mid lower abdomen probable mild ileus less likely enteritis. No definite evidence of small bowel obstruction. No transition point in caliber of small bowel. Moderate stool noted in right colon. Moderate to abundant stool noted within transverse colon. Moderate stool noted descending colon and sigmoid colon. Moderate gas noted within rectum. No definite evidence of acute colitis or diverticulitis. No pericecal inflammation. The patient is status post appendectomy. Vascular/Lymphatic: No aortic aneurysm. A right retroperitoneal aortocaval lymph node measures 9 mm short-axis probable reactive. A left lateral retro carinal lymph node measures 9 mm short-axis probable reactive. No mesenteric adenopathy. Please note there is retroaortic left renal vein Reproductive: The patient is status post history Other: No ascites or free  abdominal air. The urinary bladder is unremarkable. No bladder filling defects are noted. Musculoskeletal: No destructive bony lesions are noted. Sagittal images of the spine are unremarkable. IMPRESSION: 1. Bilateral kidney shows multiple area of triangular-shaped decreased parenchymal attenuation consistent with multifocal bilateral pyelonephritis. 2. Subtle mild enhancement of the wall of proximal right ureter. Associated right urinary tract inflammation/infection cannot be excluded. No hydronephrosis or hydroureter. 3. No aortic aneurysm. Retroaortic left renal vein. There is borderline retroperitoneal adenopathy probable reactive. 4. Minimal fluid distended small bowel loops in mid abdomen probable mild ileus or enteritis. No definite evidence of small bowel obstruction. 5. Moderate stool throughout the colon. No pericecal inflammation. Status post appendectomy. No definite evidence of acute colitis or diverticulitis. 6. Status  post hysterectomy. 7. No ascites or free abdominal air. 8. Fatty infiltration of the liver.  Status post cholecystectomy. These results were called by telephone at the time of interpretation on 10/14/2016 at 12:29 pm to Dr. Everlene Farrier , who verbally acknowledged these results. Electronically Signed   By: Natasha Mead M.D.   On: 10/14/2016 12:30    Procedures Procedures (including critical care time)  Medications Ordered in ED Medications  ondansetron (ZOFRAN) injection 4 mg (4 mg Intravenous Given 10/14/16 0907)  sodium chloride 0.9 % bolus 1,000 mL (0 mLs Intravenous Stopped 10/14/16 1045)  cefTRIAXone (ROCEPHIN) 1 g in dextrose 5 % 50 mL IVPB (0 g Intravenous Stopped 10/14/16 1115)  acetaminophen (TYLENOL) tablet 650 mg (650 mg Oral Given 10/14/16 1052)  iopamidol (ISOVUE-300) 61 % injection (100 mLs  Contrast Given 10/14/16 1159)     Initial Impression / Assessment and Plan / ED Course  I have reviewed the triage vital signs and the nursing notes.  Pertinent labs &  imaging results that were available during my care of the patient were reviewed by me and considered in my medical decision making (see chart for details).    This is a 36 y.o. Female with a history of substance abuse and chronic pain who presents to the ED complaining of UTI symptoms starting two days ago and bilateral low back pain starting yesterday. Patient reports she had pain with urination and dark urine starting 2 days ago. She reports a fever 101 yesterday with associated nausea, vomiting and bilateral low back pain. She reports the pain made her vomit. She reports taking Tylenol prior to arrival today. She denies history of recent UTIs. She denies history of previous pyelonephritis. She denies history of kidney stones. She takes methadone for her history of substance abuse.  On exam the patient is afebrile nontoxic appearing. Her abdomen is soft and nontender to palpation. She has bilateral low back tenderness to palpation. No CVA or flank tenderness. Her pain is in her lower bilateral back on my exam.  Pregnancy test is negative. Lipase is within normal limits. CMP reveals chronically elevated liver enzymes which is related to her hepatitis C. Creatinine is 1.01. GFR is greater than 60. CBC shows a leukocytosis with a white count of 13,600. Urinalysis shows moderate leukocytes, too numerous to count white blood cells and many bacteria. Urine sent for culture. Patient given a gram Rocephin in the emergency department. Will obtain CT abdomen and pelvis. CT abdomen and pelvis shows bilateral pyelonephritis. No hydronephrosis or hydroureter. CT also showsl moderate stool in the colon. Patient tells me she is having bowel movements and passing gas.  At re-evaluation patient is tolerating by mouth. She is not tachycardic or hypotensive. She is feeling better. After evaluation by my attending Dr. Broadus John, we both agree patient is safe for discharge at this time. Dr. Donnald Garre recommends Augmentin due to  the patient's history of substance abuse. Urine sent for culture. Patient received a gram of Rocephin in the emergency department. I discussed strict and specific return precautions with the patient. I advised the patient to follow-up with their primary care provider this week. I advised the patient to return to the emergency department with new or worsening symptoms or new concerns. The patient verbalized understanding and agreement with plan.    This patient was discussed with and evaluated by Dr. Donnald Garre who agrees with assessment and plan.   Final Clinical Impressions(s) / ED Diagnoses   Final diagnoses:  Pyelonephritis  New Prescriptions New Prescriptions   AMOXICILLIN-CLAVULANATE (AUGMENTIN) 875-125 MG TABLET    Take 1 tablet by mouth every 12 (twelve) hours.   NAPROXEN (NAPROSYN) 250 MG TABLET    Take 1 tablet (250 mg total) by mouth 2 (two) times daily with a meal.   ONDANSETRON (ZOFRAN ODT) 4 MG DISINTEGRATING TABLET    Take 1 tablet (4 mg total) by mouth every 8 (eight) hours as needed for nausea or vomiting.     Everlene Farrier, PA-C 10/14/16 1433    Arby Barrette, MD 10/22/16 [phone]

## 2016-10-14 NOTE — ED Triage Notes (Signed)
Pt from home with c/o UTI symptoms x 2 days with bilateral lower back starting yesterday.  Pt reports N/V and fever of 101.3 yesterday.  NAD, A&O.

## 2016-10-14 NOTE — ED Notes (Signed)
ED Provider at bedside. 

## 2016-10-14 NOTE — ED Notes (Signed)
PA Will at the bedside   

## 2016-10-15 LAB — URINE CULTURE: Culture: NO GROWTH

## 2017-01-13 ENCOUNTER — Emergency Department (HOSPITAL_COMMUNITY)
Admission: EM | Admit: 2017-01-13 | Discharge: 2017-01-14 | Disposition: A | Discharge status: Other Acute Inpatient Hospital | Payer: BLUE CROSS/BLUE SHIELD | Attending: Emergency Medicine | Admitting: Emergency Medicine

## 2017-01-13 ENCOUNTER — Encounter (HOSPITAL_COMMUNITY): Payer: Self-pay

## 2017-01-13 DIAGNOSIS — F17210 Nicotine dependence, cigarettes, uncomplicated: Secondary | ICD-10-CM | POA: Diagnosis not present

## 2017-01-13 DIAGNOSIS — R945 Abnormal results of liver function studies: Secondary | ICD-10-CM | POA: Diagnosis not present

## 2017-01-13 DIAGNOSIS — R1013 Epigastric pain: Secondary | ICD-10-CM | POA: Insufficient documentation

## 2017-01-13 DIAGNOSIS — R7989 Other specified abnormal findings of blood chemistry: Secondary | ICD-10-CM

## 2017-01-13 DIAGNOSIS — F10129 Alcohol abuse with intoxication, unspecified: Secondary | ICD-10-CM | POA: Insufficient documentation

## 2017-01-13 DIAGNOSIS — R45851 Suicidal ideations: Secondary | ICD-10-CM | POA: Diagnosis not present

## 2017-01-13 DIAGNOSIS — Z79899 Other long term (current) drug therapy: Secondary | ICD-10-CM | POA: Diagnosis not present

## 2017-01-13 LAB — COMPREHENSIVE METABOLIC PANEL
ALT: 448 U/L — ABNORMAL HIGH (ref 14–54)
AST: 522 U/L — ABNORMAL HIGH (ref 15–41)
Albumin: 4.1 g/dL (ref 3.5–5.0)
Alkaline Phosphatase: 233 U/L — ABNORMAL HIGH (ref 38–126)
Anion gap: 12 (ref 5–15)
BUN: 12 mg/dL (ref 6–20)
CO2: 29 mmol/L (ref 22–32)
Calcium: 9.1 mg/dL (ref 8.9–10.3)
Chloride: 95 mmol/L — ABNORMAL LOW (ref 101–111)
Creatinine, Ser: 0.75 mg/dL (ref 0.44–1.00)
GFR calc Af Amer: >60 mL/min (ref >60)
GFR calc non Af Amer: >60 mL/min (ref >60)
Glucose, Bld: 114 mg/dL — ABNORMAL HIGH (ref 65–99)
Potassium: 4 mmol/L (ref 3.5–5.1)
Sodium: 136 mmol/L (ref 135–145)
Total Bilirubin: 0.5 mg/dL (ref 0.3–1.2)
Total Protein: 7.4 g/dL (ref 6.5–8.1)

## 2017-01-13 LAB — CBC
HCT: 47.1 % — ABNORMAL HIGH (ref 36.0–46.0)
Hemoglobin: 15.9 g/dL — ABNORMAL HIGH (ref 12.0–15.0)
MCH: 31.9 pg (ref 26.0–34.0)
MCHC: 33.8 g/dL (ref 30.0–36.0)
MCV: 94.6 fL (ref 78.0–100.0)
Platelets: 162 10*3/uL (ref 150–400)
RBC: 4.98 MIL/uL (ref 3.87–5.11)
RDW: 13.1 % (ref 11.5–15.5)
WBC: 5.3 10*3/uL (ref 4.0–10.5)

## 2017-01-13 LAB — ETHANOL: Alcohol, Ethyl (B): 149 mg/dL — ABNORMAL HIGH (ref <5)

## 2017-01-13 LAB — LIPASE, BLOOD: Lipase: 126 U/L — ABNORMAL HIGH (ref 11–51)

## 2017-01-13 LAB — SALICYLATE LEVEL: Salicylate Lvl: <7 mg/dL (ref 2.8–30.0)

## 2017-01-13 LAB — RAPID URINE DRUG SCREEN, HOSP PERFORMED
Amphetamines: NOT DETECTED
Barbiturates: NOT DETECTED
Benzodiazepines: NOT DETECTED
Cocaine: NOT DETECTED
Opiates: NOT DETECTED
Tetrahydrocannabinol: NOT DETECTED

## 2017-01-13 LAB — ACETAMINOPHEN LEVEL: Acetaminophen (Tylenol), Serum: <10 ug/mL — ABNORMAL LOW (ref 10–30)

## 2017-01-13 LAB — POC URINE PREG, ED: Preg Test, Ur: NEGATIVE

## 2017-01-13 MED ORDER — LORAZEPAM 1 MG PO TABS
0.0000 mg | ORAL_TABLET | Freq: Four times a day (QID) | ORAL | Status: DC
  Administered 2017-01-13 – 2017-01-14 (×2): 2 mg via ORAL
  Administered 2017-01-14: 1 mg via ORAL
  Administered 2017-01-14: 2 mg via ORAL
  Filled 2017-01-13 (×3): qty 2
  Filled 2017-01-13: qty 1

## 2017-01-13 MED ORDER — LORAZEPAM 2 MG/ML IJ SOLN: 0.0000 mg | Freq: Two times a day (BID) | INTRAMUSCULAR | Status: DC

## 2017-01-13 MED ORDER — LORAZEPAM 1 MG PO TABS: 0.0000 mg | ORAL_TABLET | Freq: Two times a day (BID) | ORAL | Status: DC

## 2017-01-13 MED ORDER — VITAMIN B-1 100 MG PO TABS
100.0000 mg | ORAL_TABLET | Freq: Every day | ORAL | Status: DC
  Administered 2017-01-14: 100 mg via ORAL
  Filled 2017-01-13: qty 1

## 2017-01-13 MED ORDER — LORAZEPAM 2 MG/ML IJ SOLN: 0.0000 mg | Freq: Four times a day (QID) | INTRAMUSCULAR | Status: DC

## 2017-01-13 MED ORDER — THIAMINE HCL 100 MG/ML IJ SOLN
100.0000 mg | Freq: Every day | INTRAMUSCULAR | Status: DC
  Filled 2017-01-13: qty 2

## 2017-01-13 NOTE — ED Notes (Signed)
Staffing office called for sitter. 

## 2017-01-13 NOTE — ED Notes (Signed)
Pt husband took all pt belongings.

## 2017-01-13 NOTE — ED Notes (Signed)
Husband [employer] cell number: [phone]

## 2017-01-13 NOTE — ED Triage Notes (Signed)
Pt states she has had increasing suicidal thoughts X24 hours. Pt states she is trying to seek treatment for alcoholism. Last drink was this morning. Pt tearful in triage.

## 2017-01-13 NOTE — ED Provider Notes (Signed)
[facility]PT Provider Note   CSN: [id number] Arrival date & time: 01/13/17  1650     History   Chief Complaint Chief Complaint  Patient presents with  . Suicidal    HPI Carla Park is a 36 y.o. female presenting with 24 hours of suicidal ideation and alcohol intoxication. She reports that she has been trying to quit drinking and is unsuccessful. She is also followed by the methadone clinic but was unable to get methadone today because she had alcohol on breath. She also reports epigastric discomfort. She states that the last time she drank was late last night. He typically drinks a liter of wine per day. She denies any other symptoms.  HPI  Past Medical History:  Diagnosis Date  . Arthritis   . Chronic pain   . Fibromyalgia   . Hep C w/ coma, chronic [facility])     Patient Active Problem List   Diagnosis Date Noted  . Alcohol withdrawal (HCC) 05/29/2015  . Chronic pain   . Fibromyalgia   . Hep C w/ coma, chronic (HCC)     Past Surgical History:  Procedure Laterality Date  . ABDOMINAL HYSTERECTOMY    . APPENDECTOMY    . CESAREAN SECTION    . CHOLECYSTECTOMY    . KNEE SURGERY      OB History    No data available       Home Medications    Prior to Admission medications   Medication Sig Start Date End Date Taking? Authorizing Provider  acetaminophen (TYLENOL) 325 MG tablet Take 650 mg by mouth every 6 (six) hours as needed for mild pain.   Yes [provider]  methadone (DOLOPHINE) 10 MG/ML solution Take 160 mg by mouth daily.    Yes [provider]  amoxicillin-clavulanate (AUGMENTIN) 875-125 MG tablet Take 1 tablet by mouth every 12 (twelve) hours. Patient not taking: Reported on 01/13/2017 10/14/16   Everlene Farrier, PA-C  Multiple Vitamin (MULTIVITAMIN WITH MINERALS) TABS tablet Take 1 tablet by mouth 3 (three) times daily. Patient not taking: Reported on 01/13/2017 06/16/15   Hedges, Tinnie Gens, PA-C  naproxen (NAPROSYN) 250 MG tablet  Take 1 tablet (250 mg total) by mouth 2 (two) times daily with a meal. Patient not taking: Reported on 01/13/2017 10/14/16   Everlene Farrier, PA-C  ondansetron (ZOFRAN ODT) 4 MG disintegrating tablet Take 1 tablet (4 mg total) by mouth every 8 (eight) hours as needed for nausea or vomiting. Patient not taking: Reported on 01/13/2017 10/14/16   Everlene Farrier, PA-C    Family History Family History  Problem Relation Age of Onset  . Multiple sclerosis Mother     Social History Social History  Substance Use Topics  . Smoking status: Current Every Day Smoker    Packs/day: 1.00    Types: Cigarettes  . Smokeless tobacco: Never Used  . Alcohol use 3.6 oz/week    6 Glasses of wine per week     Comment: 500 ml boxes      Allergies   Erythromycin; Lamictal [lamotrigine]; and Sumatriptan   Review of Systems Review of Systems  Constitutional: Negative for chills and fever.  HENT: Negative for ear pain and sore throat.   Eyes: Negative for visual disturbance.  Respiratory: Negative for cough, shortness of breath, wheezing and stridor.   Cardiovascular: Negative for chest pain, palpitations and leg swelling.  Gastrointestinal: Positive for abdominal pain. Negative for abdominal distention, blood in stool, diarrhea, nausea and vomiting.  Genitourinary: Negative for difficulty urinating,  dysuria, flank pain, frequency, hematuria and pelvic pain.  Musculoskeletal: Negative for arthralgias, back pain, myalgias, neck pain and neck stiffness.  Skin: Negative for color change, pallor and rash.  Neurological: Positive for tremors. Negative for dizziness, seizures, syncope, facial asymmetry, speech difficulty, weakness, light-headedness, numbness and headaches.     Physical Exam Updated Vital Signs BP (!) 147/101   Pulse 71   Temp 97.8 F (36.6 C) (Oral)   Resp 16   SpO2 98%   Physical Exam  Constitutional: She appears well-developed and well-nourished. No distress.  Patient is afebrile,  nontoxic-appearing, sitting in bed with mild tremors appears slightly anxious and tearful.  HENT:  Head: Normocephalic and atraumatic.  Eyes: Conjunctivae and EOM are normal. Right eye exhibits no discharge. Left eye exhibits no discharge.  Neck: Normal range of motion. Neck supple.  Cardiovascular: Normal rate, regular rhythm, normal heart sounds and intact distal pulses.   No murmur heard. Pulmonary/Chest: Effort normal and breath sounds normal. No respiratory distress. She has no wheezes. She has no rales.  Abdominal: Soft. She exhibits no distension. There is tenderness. There is no rebound and no guarding.  Mild discomfort palpation of the epigastric region. Abdomen is soft and otherwise nontender, negative Murphy sign, negative McBurney's point tenderness, negative rebound, negative peritoneal signs.  Musculoskeletal: Normal range of motion. She exhibits no edema or deformity.  Neurological: She is alert.  Skin: Skin is warm and dry. No rash noted. She is not diaphoretic. No erythema. No pallor.  Nursing note and vitals reviewed.    ED Treatments / Results  Labs (all labs ordered are listed, but only abnormal results are displayed) Labs Reviewed  COMPREHENSIVE METABOLIC PANEL - Abnormal; Notable for the following:       Result Value   Chloride 95 (*)    Glucose, Bld 114 (*)    AST 522 (*)    ALT 448 (*)    Alkaline Phosphatase 233 (*)    All other components within normal limits  ETHANOL - Abnormal; Notable for the following:    Alcohol, Ethyl (B) 149 (*)    All other components within normal limits  ACETAMINOPHEN LEVEL - Abnormal; Notable for the following:    Acetaminophen (Tylenol), Serum <10 (*)    All other components within normal limits  CBC - Abnormal; Notable for the following:    Hemoglobin 15.9 (*)    HCT 47.1 (*)    All other components within normal limits  LIPASE, BLOOD - Abnormal; Notable for the following:    Lipase 126 (*)    All other components within  normal limits  SALICYLATE LEVEL  RAPID URINE DRUG SCREEN, HOSP PERFORMED  POC URINE PREG, ED    EKG  EKG Interpretation None       Radiology No results found.  Procedures Procedures (including critical care time)  Medications Ordered in ED Medications  LORazepam (ATIVAN) injection 0-4 mg ( Intravenous See Alternative 01/13/17 2315)    Or  LORazepam (ATIVAN) tablet 0-4 mg (2 mg Oral Given 01/13/17 2315)  LORazepam (ATIVAN) injection 0-4 mg (not administered)    Or  LORazepam (ATIVAN) tablet 0-4 mg (not administered)  thiamine (VITAMIN B-1) tablet 100 mg (not administered)    Or  thiamine (B-1) injection 100 mg (not administered)  acetaminophen (TYLENOL) tablet 650 mg (not administered)  amoxicillin-clavulanate (AUGMENTIN) 875-125 MG per tablet 1 tablet (not administered)  methadone (DOLOPHINE) 10 MG/ML solution 160 mg (not administered)  multivitamin with minerals tablet 1 tablet (not  administered)  naproxen (NAPROSYN) tablet 250 mg (not administered)  ondansetron (ZOFRAN-ODT) disintegrating tablet 4 mg (not administered)  ibuprofen (ADVIL,MOTRIN) tablet 600 mg (not administered)  ondansetron (ZOFRAN) tablet 4 mg (not administered)     Initial Impression / Assessment and Plan / ED Course  I have reviewed the triage vital signs and the nursing notes.  Pertinent labs & imaging results that were available during my care of the patient were reviewed by me and considered in my medical decision making (see chart for details).    Patient presents with suicidal ideation and alcohol abuse. Patient was placed on CIWA protocol  EtOH 147 , Labs otherwise unremarkable, ordered lipase  Consult placed to psych  Patient care was transferred at end of shift to [facility] pending TTS evaluation, patient was resting comfortably and had no complaints at the time of transfer.  Final Clinical Impressions(s) / ED Diagnoses   Final diagnoses:  Suicidal ideation    New  Prescriptions New Prescriptions   No medications on file     Gregary Cromer 01/14/17 0131    Mancel Bale, MD 01/14/17 [phone]

## 2017-01-13 NOTE — ED Notes (Signed)
Family members taking all pt belongings home

## 2017-01-13 NOTE — ED Notes (Signed)
Pt states that she is upset with her drinking and how it's affecting her family. Pt is tearful on assessment. Pt states that she plans to cut wrist with razor blade. Cuts noticed on pt's left wrist. Pt states she wants detox for alcohol withdrawal and then placement.

## 2017-01-14 ENCOUNTER — Inpatient Hospital Stay (HOSPITAL_COMMUNITY)
Admission: AD | Admit: 2017-01-14 | Discharge: 2017-01-28 | DRG: 885 | Disposition: A | Discharge status: Other Acute Inpatient Hospital | Payer: BLUE CROSS/BLUE SHIELD | Source: Intra-hospital | Attending: Psychiatry | Admitting: Psychiatry

## 2017-01-14 ENCOUNTER — Other Ambulatory Visit: Payer: Self-pay

## 2017-01-14 DIAGNOSIS — M069 Rheumatoid arthritis, unspecified: Secondary | ICD-10-CM | POA: Diagnosis present

## 2017-01-14 DIAGNOSIS — F29 Unspecified psychosis not due to a substance or known physiological condition: Secondary | ICD-10-CM | POA: Diagnosis not present

## 2017-01-14 DIAGNOSIS — F332 Major depressive disorder, recurrent severe without psychotic features: Secondary | ICD-10-CM | POA: Diagnosis present

## 2017-01-14 DIAGNOSIS — G8929 Other chronic pain: Secondary | ICD-10-CM | POA: Diagnosis present

## 2017-01-14 DIAGNOSIS — B1920 Unspecified viral hepatitis C without hepatic coma: Secondary | ICD-10-CM | POA: Diagnosis not present

## 2017-01-14 DIAGNOSIS — F419 Anxiety disorder, unspecified: Secondary | ICD-10-CM | POA: Diagnosis not present

## 2017-01-14 DIAGNOSIS — F411 Generalized anxiety disorder: Secondary | ICD-10-CM | POA: Diagnosis present

## 2017-01-14 DIAGNOSIS — F1120 Opioid dependence, uncomplicated: Secondary | ICD-10-CM | POA: Diagnosis present

## 2017-01-14 DIAGNOSIS — G4700 Insomnia, unspecified: Secondary | ICD-10-CM | POA: Diagnosis present

## 2017-01-14 DIAGNOSIS — F10239 Alcohol dependence with withdrawal, unspecified: Secondary | ICD-10-CM | POA: Diagnosis not present

## 2017-01-14 DIAGNOSIS — F17210 Nicotine dependence, cigarettes, uncomplicated: Secondary | ICD-10-CM | POA: Diagnosis present

## 2017-01-14 DIAGNOSIS — F1994 Other psychoactive substance use, unspecified with psychoactive substance-induced mood disorder: Secondary | ICD-10-CM | POA: Diagnosis not present

## 2017-01-14 DIAGNOSIS — M797 Fibromyalgia: Secondary | ICD-10-CM | POA: Diagnosis present

## 2017-01-14 DIAGNOSIS — B1921 Unspecified viral hepatitis C with hepatic coma: Secondary | ICD-10-CM | POA: Diagnosis present

## 2017-01-14 DIAGNOSIS — F1020 Alcohol dependence, uncomplicated: Secondary | ICD-10-CM | POA: Diagnosis present

## 2017-01-14 DIAGNOSIS — F10939 Alcohol use, unspecified with withdrawal, unspecified: Secondary | ICD-10-CM | POA: Diagnosis present

## 2017-01-14 DIAGNOSIS — K769 Liver disease, unspecified: Secondary | ICD-10-CM | POA: Diagnosis not present

## 2017-01-14 DIAGNOSIS — K219 Gastro-esophageal reflux disease without esophagitis: Secondary | ICD-10-CM | POA: Diagnosis present

## 2017-01-14 DIAGNOSIS — R45851 Suicidal ideations: Secondary | ICD-10-CM | POA: Diagnosis present

## 2017-01-14 DIAGNOSIS — F10129 Alcohol abuse with intoxication, unspecified: Secondary | ICD-10-CM | POA: Diagnosis not present

## 2017-01-14 DIAGNOSIS — F10230 Alcohol dependence with withdrawal, uncomplicated: Secondary | ICD-10-CM | POA: Diagnosis not present

## 2017-01-14 DIAGNOSIS — Z79899 Other long term (current) drug therapy: Secondary | ICD-10-CM | POA: Diagnosis not present

## 2017-01-14 DIAGNOSIS — F39 Unspecified mood [affective] disorder: Secondary | ICD-10-CM | POA: Diagnosis not present

## 2017-01-14 LAB — AMMONIA: Ammonia: 67 umol/L — ABNORMAL HIGH (ref 9–35)

## 2017-01-14 MED ORDER — ALUM & MAG HYDROXIDE-SIMETH 200-200-20 MG/5ML PO SUSP: 30.0000 mL | ORAL | Status: DC | PRN

## 2017-01-14 MED ORDER — METHOCARBAMOL 500 MG PO TABS
500.0000 mg | ORAL_TABLET | Freq: Three times a day (TID) | ORAL | Status: AC | PRN
  Administered 2017-01-15 – 2017-01-19 (×5): 500 mg via ORAL
  Filled 2017-01-14 (×6): qty 1

## 2017-01-14 MED ORDER — NAPROXEN 500 MG PO TABS: 500.0000 mg | ORAL_TABLET | Freq: Two times a day (BID) | ORAL | Status: DC | PRN

## 2017-01-14 MED ORDER — THIAMINE HCL 100 MG/ML IJ SOLN: 100.0000 mg | Freq: Once | INTRAMUSCULAR | Status: DC

## 2017-01-14 MED ORDER — LOPERAMIDE HCL 2 MG PO CAPS: 2.0000 mg | ORAL_CAPSULE | ORAL | Status: DC | PRN

## 2017-01-14 MED ORDER — LACTULOSE 10 GM/15ML PO SOLN
10.0000 g | Freq: Three times a day (TID) | ORAL | Status: DC
  Administered 2017-01-14 – 2017-01-28 (×41): 10 g via ORAL
  Filled 2017-01-14 (×48): qty 15

## 2017-01-14 MED ORDER — DICYCLOMINE HCL 20 MG PO TABS: 20.0000 mg | ORAL_TABLET | Freq: Four times a day (QID) | ORAL | Status: DC | PRN

## 2017-01-14 MED ORDER — ONDANSETRON HCL 4 MG PO TABS: 4.0000 mg | ORAL_TABLET | Freq: Three times a day (TID) | ORAL | Status: DC | PRN

## 2017-01-14 MED ORDER — CLONIDINE HCL 0.1 MG PO TABS
0.1000 mg | ORAL_TABLET | Freq: Four times a day (QID) | ORAL | Status: DC
  Administered 2017-01-14 – 2017-01-15 (×2): 0.1 mg via ORAL
  Filled 2017-01-14 (×11): qty 1

## 2017-01-14 MED ORDER — LORAZEPAM 1 MG PO TABS
1.0000 mg | ORAL_TABLET | Freq: Two times a day (BID) | ORAL | Status: AC
  Administered 2017-01-17 – 2017-01-18 (×2): 1 mg via ORAL
  Filled 2017-01-14 (×2): qty 1

## 2017-01-14 MED ORDER — TRAZODONE HCL 50 MG PO TABS
50.0000 mg | ORAL_TABLET | Freq: Every evening | ORAL | Status: DC | PRN
  Administered 2017-01-14 – 2017-01-16 (×4): 50 mg via ORAL
  Filled 2017-01-14 (×9): qty 1

## 2017-01-14 MED ORDER — NICOTINE 21 MG/24HR TD PT24
21.0000 mg | MEDICATED_PATCH | Freq: Once | TRANSDERMAL | Status: DC
  Administered 2017-01-14: 21 mg via TRANSDERMAL
  Filled 2017-01-14: qty 1

## 2017-01-14 MED ORDER — METHADONE HCL 10 MG PO TABS
160.0000 mg | ORAL_TABLET | Freq: Every day | ORAL | Status: DC
  Administered 2017-01-14: 160 mg via ORAL
  Filled 2017-01-14: qty 16

## 2017-01-14 MED ORDER — AMOXICILLIN-POT CLAVULANATE 875-125 MG PO TABS: 1.0000 | ORAL_TABLET | Freq: Two times a day (BID) | ORAL | Status: DC

## 2017-01-14 MED ORDER — PANTOPRAZOLE SODIUM 40 MG PO TBEC
40.0000 mg | DELAYED_RELEASE_TABLET | Freq: Every day | ORAL | Status: DC
  Administered 2017-01-15 – 2017-01-28 (×14): 40 mg via ORAL
  Filled 2017-01-14 (×18): qty 1

## 2017-01-14 MED ORDER — TOPIRAMATE 25 MG PO TABS
25.0000 mg | ORAL_TABLET | Freq: Every day | ORAL | Status: DC
  Administered 2017-01-14 – 2017-01-21 (×7): 25 mg via ORAL
  Filled 2017-01-14 (×12): qty 1

## 2017-01-14 MED ORDER — ADULT MULTIVITAMIN W/MINERALS CH
1.0000 | ORAL_TABLET | Freq: Three times a day (TID) | ORAL | Status: DC
  Administered 2017-01-14 (×2): 1 via ORAL
  Filled 2017-01-14 (×2): qty 1

## 2017-01-14 MED ORDER — ADULT MULTIVITAMIN W/MINERALS CH
1.0000 | ORAL_TABLET | Freq: Every day | ORAL | Status: DC
  Administered 2017-01-15 – 2017-01-28 (×14): 1 via ORAL
  Filled 2017-01-14 (×18): qty 1

## 2017-01-14 MED ORDER — MAGNESIUM HYDROXIDE 400 MG/5ML PO SUSP: 30.0000 mL | Freq: Every day | ORAL | Status: DC | PRN

## 2017-01-14 MED ORDER — DULOXETINE HCL 20 MG PO CPEP
20.0000 mg | ORAL_CAPSULE | Freq: Two times a day (BID) | ORAL | Status: DC
  Administered 2017-01-15: 20 mg via ORAL
  Filled 2017-01-14 (×6): qty 1

## 2017-01-14 MED ORDER — NAPROXEN 500 MG PO TABS
500.0000 mg | ORAL_TABLET | Freq: Two times a day (BID) | ORAL | Status: DC
  Administered 2017-01-15 – 2017-01-28 (×27): 500 mg via ORAL
  Filled 2017-01-14 (×33): qty 1

## 2017-01-14 MED ORDER — ONDANSETRON 4 MG PO TBDP: 4.0000 mg | ORAL_TABLET | Freq: Four times a day (QID) | ORAL | Status: AC | PRN

## 2017-01-14 MED ORDER — NAPROXEN 250 MG PO TABS
250.0000 mg | ORAL_TABLET | Freq: Two times a day (BID) | ORAL | Status: DC
  Administered 2017-01-14 (×2): 250 mg via ORAL
  Filled 2017-01-14 (×2): qty 1

## 2017-01-14 MED ORDER — DULOXETINE HCL 20 MG PO CPEP
20.0000 mg | ORAL_CAPSULE | Freq: Two times a day (BID) | ORAL | Status: DC
  Filled 2017-01-14 (×2): qty 1

## 2017-01-14 MED ORDER — ACETAMINOPHEN 325 MG PO TABS: 650.0000 mg | ORAL_TABLET | Freq: Four times a day (QID) | ORAL | Status: DC | PRN

## 2017-01-14 MED ORDER — LORAZEPAM 1 MG PO TABS
1.0000 mg | ORAL_TABLET | Freq: Three times a day (TID) | ORAL | Status: AC
  Administered 2017-01-16 – 2017-01-17 (×3): 1 mg via ORAL
  Filled 2017-01-14 (×3): qty 1

## 2017-01-14 MED ORDER — HYDROXYZINE HCL 25 MG PO TABS
25.0000 mg | ORAL_TABLET | Freq: Four times a day (QID) | ORAL | Status: DC | PRN
  Administered 2017-01-14 – 2017-01-28 (×19): 25 mg via ORAL
  Filled 2017-01-14 (×19): qty 1

## 2017-01-14 MED ORDER — ONDANSETRON 4 MG PO TBDP
4.0000 mg | ORAL_TABLET | Freq: Three times a day (TID) | ORAL | Status: DC | PRN
  Administered 2017-01-14: 4 mg via ORAL
  Filled 2017-01-14: qty 1

## 2017-01-14 MED ORDER — VITAMIN B-1 100 MG PO TABS
100.0000 mg | ORAL_TABLET | Freq: Every day | ORAL | Status: DC
  Administered 2017-01-15 – 2017-01-28 (×14): 100 mg via ORAL
  Filled 2017-01-14 (×18): qty 1

## 2017-01-14 MED ORDER — LORAZEPAM 1 MG PO TABS
1.0000 mg | ORAL_TABLET | Freq: Every day | ORAL | Status: AC
  Administered 2017-01-19: 1 mg via ORAL
  Filled 2017-01-14: qty 1

## 2017-01-14 MED ORDER — TRAZODONE HCL 50 MG PO TABS: 50.0000 mg | ORAL_TABLET | Freq: Every evening | ORAL | Status: DC | PRN

## 2017-01-14 MED ORDER — LORAZEPAM 1 MG PO TABS
1.0000 mg | ORAL_TABLET | Freq: Four times a day (QID) | ORAL | Status: AC
  Administered 2017-01-15 – 2017-01-16 (×5): 1 mg via ORAL
  Filled 2017-01-14 (×5): qty 1

## 2017-01-14 MED ORDER — CLONIDINE HCL 0.1 MG PO TABS: 0.1000 mg | ORAL_TABLET | Freq: Every day | ORAL | Status: DC

## 2017-01-14 MED ORDER — LORAZEPAM 1 MG PO TABS
1.0000 mg | ORAL_TABLET | Freq: Four times a day (QID) | ORAL | Status: AC | PRN
  Administered 2017-01-14 – 2017-01-17 (×4): 1 mg via ORAL
  Filled 2017-01-14 (×4): qty 1

## 2017-01-14 MED ORDER — KETOROLAC TROMETHAMINE 60 MG/2ML IM SOLN
15.0000 mg | Freq: Once | INTRAMUSCULAR | Status: AC
  Administered 2017-01-14: 15 mg via INTRAMUSCULAR
  Filled 2017-01-14 (×2): qty 2

## 2017-01-14 MED ORDER — IBUPROFEN 400 MG PO TABS: 600.0000 mg | ORAL_TABLET | Freq: Three times a day (TID) | ORAL | Status: DC | PRN

## 2017-01-14 MED ORDER — CLONIDINE HCL 0.1 MG PO TABS
0.1000 mg | ORAL_TABLET | ORAL | Status: DC
  Filled 2017-01-14: qty 1

## 2017-01-14 MED ORDER — OLANZAPINE 5 MG PO TABS
5.0000 mg | ORAL_TABLET | Freq: Every day | ORAL | Status: DC
  Administered 2017-01-14 – 2017-01-19 (×6): 5 mg via ORAL
  Filled 2017-01-14: qty 2
  Filled 2017-01-14 (×9): qty 1

## 2017-01-14 NOTE — Progress Notes (Addendum)
CSW called and spoke to pt's nurse, Jonny Ruiz, per request from [facility], Linsey S, and asked to have EDP document review of pt's AST and ALT levels.    CSW also asked that someone contact the methadone clinic that the patient normally uses to ensure that the dosage of methadone and that the patient has not missed any doses.  [facility] cannot prescribe methadone but can transport patient to clinic daily if her treatment has been continuous there.  ED Nurse agreed to both requests and will call CSW with information on pt's methadone status.  Timmothy Euler. Kaylyn Lim, MSW, LCSWA Clinical Social Work Disposition [phone]  ED Nurse, Jonny Ruiz, called and confirmed that EDP had documented review of labs and that he had spoken to the methadone clinic and would document dosage and that pt had been regularly getting her methadone including her last scheduled dose.  CSW communicated above to [facility].

## 2017-01-14 NOTE — ED Notes (Addendum)
Patient was given a snack and a drink, and A Regular Diet was ordered for Dinner.

## 2017-01-14 NOTE — ED Notes (Addendum)
Spoke with Griselda Miner at [facility] regarding pt current methadone dose and treatment. Facility stated that patient is a current patient receiving 160mg  Methadone 1 time daily. Pt last dose was on 01/10/17. Jemico also stated that report was called to [facility] and given to [facility] regarding pts current treatment.

## 2017-01-14 NOTE — ED Provider Notes (Signed)
I discussed patient's presentation with our behavioral health colleagues Patient has been accepted for inpatient psychiatric care. Patient does have history of alcohol abuse, and earlier dictation of patient's elevated alcohol level, otherwise unremarkable labs accurately describes her anticipated elevated liver enzyme levels.   Gerhard Munch, MD 01/14/17 [phone]

## 2017-01-14 NOTE — Progress Notes (Signed)
CSW contacted [facility] ED nurse, Kennyth Arnold, and informed her that pt. Has been re-accepted to [facility], Bed 305-1.  Dr. Lucianne Muss accepting, Call report to [phone].  Pt may come at any time.  Carla Park. Kaylyn Lim, MSW, LCSWA Clinical Social Work Disposition [phone]

## 2017-01-14 NOTE — ED Notes (Signed)
Spoke with Dr. Towanda Malkin regarding ammonia level. He does not see reason to treat pt as this time. MD made [facility] aware of pt condition. Awaiting call back from [facility] on placement for pt.

## 2017-01-14 NOTE — Progress Notes (Signed)
Pt. Accepted to [facility] Bed 305-1, pending results of requested ammonia level labs, Dr. Lucianne Muss accepting, Call report to [phone].  Patient may arrive at 21:00.  [facility] Nurse, Jonny Ruiz notified.  Carla Park. Kaylyn Lim, MSW, LCSWA Clinical Social Work Disposition [phone]

## 2017-01-14 NOTE — ED Notes (Signed)
Spoke with [facility] at [facility]. Pt is unable to go to [facility] until ammonia level is addressed.

## 2017-01-14 NOTE — ED Notes (Addendum)
Spoke with Carney Bern at [facility]. Advised that MD placed note regarding elevated liver enzymes and advised on current methadone treatment regimen. Carney Bern stated that ammonia level will need to be drawn but pt should have placement at [facility] for 9pm this evening with Dr. Lucianne Muss as accepting physician.

## 2017-01-14 NOTE — ED Notes (Signed)
Voluntary Admission form faxed to [facility].

## 2017-01-14 NOTE — BH Assessment (Signed)
Tele Assessment Note   Carla Park is an 36 y.o. female presenting to the ED with a plan to cut her wrist with a razor blade. The patient reports increased drinking over the last few months, resulting a  DUI and a change in her status at the methadone clinic. The patient has been going to the clinic for several years. Had worked up to going only once per week, she's now attending every other day due to alcohol use that left her with a lose in privileges. Denies other drug use. Drinks 2 to 3 500 ml wine bottles daily.  The patient admits to SI thoughts in the past but no previous attempts. Cut wrist a few weeks ago, self injurious behavior.  Denies HI or A/V. Had depressed mood and affect, fair eye contact, poor judgement and insight.   First used opiates age 29, used for years, one relapse. Currently attends the methadone program. 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 Has panic attacks daily with shortness of breath. Bouts of depression throughout the patients adult life. The patient had several inpatient admission, one for rehab and x2 psychiatric admissions in [place].  Lives with her husband and 10 yr old son.   Donell Sievert NP recommends inpatient   Diagnosis: MDD, recurrent severe, without psychotic features; Alcohol use disorder  Past Medical History:  Past Medical History:  Diagnosis Date  . Arthritis   . Chronic pain   . Fibromyalgia   . Hep C w/ coma, chronic (HCC)     Past Surgical History:  Procedure Laterality Date  . ABDOMINAL HYSTERECTOMY    . APPENDECTOMY    . CESAREAN SECTION    . CHOLECYSTECTOMY    . KNEE SURGERY      Family History:  Family History  Problem Relation Age of Onset  . Multiple sclerosis Mother     Social History:  reports that she has been smoking Cigarettes.  She has been smoking about 1.00 pack per day. She has never used smokeless tobacco. She reports that she  drinks about 3.6 oz of alcohol per week . She reports that she does not use drugs.  Additional Social History:  Alcohol / Drug Use Pain Medications: see MAR Prescriptions: see MAR Over the Counter: see MAR History of alcohol / drug use?: Yes Substance #1 Name of Substance 1: alcohol 1 - Age of First Use: 11 or 12 1 - Amount (size/oz): 2 or 3 of the 500 ml box wines 1 - Frequency: daily 1 - Duration: a few months Substance #2 Name of Substance 2: opiates 2 - Last Use / Amount: for years, relapsed at least once, on a methadone program for 2 yrs.  CIWA: CIWA-Ar BP: (!) 137/94 Pulse Rate: 71 Nausea and Vomiting: 3 Tactile Disturbances: none Tremor: three Auditory Disturbances: not present Paroxysmal Sweats: no sweat visible Visual Disturbances: not present Anxiety: moderately anxious, or guarded, so anxiety is inferred Headache, Fullness in Head: moderate Agitation: somewhat more than normal activity Orientation and Clouding of Sensorium: oriented and can do serial additions CIWA-Ar Total: 14 COWS:    PATIENT STRENGTHS: (choose at least two) Average or above average intelligence Supportive family/friends  Allergies:  Allergies  Allergen Reactions  . Erythromycin Hives  . Lamictal [Lamotrigine] Dermatitis  . Sumatriptan Other (See Comments)    Becomes angry    Home Medications:  (Not in a hospital admission)  OB/GYN Status:  No LMP  recorded. Patient has had a hysterectomy.  General Assessment Data Location of Assessment: [facility] ED TTS Assessment: In system Is this a Tele or Face-to-Face Assessment?: Tele Assessment Is this an Initial Assessment or a Re-assessment for this encounter?: Initial Assessment Marital status: Single Is patient pregnant?: No Pregnancy Status: No Living Arrangements: Spouse/significant other (69 yr old son) Can pt return to current living arrangement?: Yes Admission Status: Voluntary Is patient capable of signing voluntary admission?:  Yes Referral Source: Self/Family/Friend Insurance type: Scientist, research (physical sciences) Exam [facility] Walk-in ONLY) Medical Exam completed: Yes  Crisis Care Plan Living Arrangements: Spouse/significant other (17 yr old son) Name of Psychiatrist: n/a Name of Therapist: n/a  Education Status Is patient currently in school?: No  Risk to self with the past 6 months Suicidal Ideation: Yes-Currently Present Has patient been a risk to self within the past 6 months prior to admission? : Yes Suicidal Intent: Yes-Currently Present Has patient had any suicidal intent within the past 6 months prior to admission? : Yes Is patient at risk for suicide?: Yes Suicidal Plan?: Yes-Currently Present Has patient had any suicidal plan within the past 6 months prior to admission? : Yes Specify Current Suicidal Plan: cut wrist Access to Means: Yes Specify Access to Suicidal Means: access to sharp objects What has been your use of drugs/alcohol within the last 12 months?: alcohol Previous Attempts/Gestures: No How many times?: 0 Other Self Harm Risks: 0 Intentional Self Injurious Behavior: Cutting Comment - Self Injurious Behavior: a few weeks ago Family Suicide History: Unknown Persecutory voices/beliefs?: No Depression: Yes Depression Symptoms: Feeling worthless/self pity Substance abuse history and/or treatment for substance abuse?: Yes Suicide prevention information given to non-admitted patients: Not applicable  Risk to Others within the past 6 months Homicidal Ideation: No Does patient have any lifetime risk of violence toward others beyond the six months prior to admission? : No Thoughts of Harm to Others: No Current Homicidal Intent: No Current Homicidal Plan: No Access to Homicidal Means: No History of harm to others?: No Assessment of Violence: None Noted Does patient have access to weapons?: No Criminal Charges Pending?: Yes Describe Pending Criminal Charges: DUI Does patient have a court  date: Yes Court Date: 01/27/17 (end of march got a DUI) Is patient on probation?: No  Psychosis Hallucinations: None noted Delusions: None noted  Mental Status Report Appearance/Hygiene: Unremarkable Eye Contact: Fair Motor Activity: Restlessness, Tremors Speech: Logical/coherent Level of Consciousness: Alert Mood: Depressed Affect: Depressed Anxiety Level: Panic Attacks Panic attack frequency: a few a week Most recent panic attack: today Thought Processes: Coherent, Relevant Judgement: Impaired Orientation: Person, Place, Time, Situation Obsessive Compulsive Thoughts/Behaviors: Unable to Assess  Cognitive Functioning Concentration: Normal Memory: Recent Intact, Remote Intact IQ: Average Insight: Poor Impulse Control: Poor Appetite: Poor Weight Loss: 0 Weight Gain: 0 Sleep: Increased Vegetative Symptoms: Staying in bed  ADLScreening [facility] Assessment Services) Patient's cognitive ability adequate to safely complete daily activities?: Yes Patient able to express need for assistance with ADLs?: Yes Independently performs ADLs?: Yes (appropriate for developmental age)  Prior Inpatient Therapy Prior Inpatient Therapy: Yes Prior Therapy Dates: years ago Prior Therapy Facilty/Provider(s): [place]  Prior Outpatient Therapy Prior Outpatient Therapy: No Does patient have an ACCT team?: No Does patient have Intensive In-House Services?  : No Does patient have [facility] services? : No Does patient have P4CC services?: No  ADL Screening (condition at time of admission) Patient's cognitive ability adequate to safely complete daily activities?: Yes Is the patient deaf or  have difficulty hearing?: No Does the patient have difficulty seeing, even when wearing glasses/contacts?: No Does the patient have difficulty concentrating, remembering, or making decisions?: No Patient able to express need for assistance with ADLs?: Yes Does the patient have difficulty  dressing or bathing?: No Independently performs ADLs?: Yes (appropriate for developmental age)             Merchant navy officer (For Healthcare) Does Patient Have a Medical Advance Directive?: No Would patient like information on creating a medical advance directive?: No - Patient declined    Additional Information 1:1 In Past 12 Months?: No CIRT Risk: No Elopement Risk: No Does patient have medical clearance?: Yes     Disposition:  Disposition Initial Assessment Completed for this Encounter: Yes Disposition of Patient: Inpatient treatment program Type of inpatient treatment program: Adult  Vonzell Schlatter [facility] 01/14/2017 2:54 AM

## 2017-01-14 NOTE — ED Notes (Signed)
Pt.s husband and son at the beside visiting per policy

## 2017-01-14 NOTE — ED Provider Notes (Signed)
BH team informed me that pt's ammonia level is too high, and that they cant accept the patient.  I was informed by bedside RN that pt is not confused at all nor is the patient somnolent. I asked him who ordered ammonia in first place, and I was informed that it was [facility] team.  I assessed Ms. Towe, she is ao x 3, and demonstrates good judgement. Her LFTs are elevated -but LFTs in that range have no clinical significance, especially if bilirubin is normal.  I informed AC at [facility] team that hepatic encephalopathy is a clinical diagnosis. Ammonia was never required in first place. Ammonia levels, high or low, dont mean anything in a patient who is ao x 3. I also advised that we will not give lactulose and infer any side effects from that medicine to the patient.  I received a call from [facility] - who has reviewed the case and they will accept the patient to [facility].  I did a final reassessment again - pt is still ao x 3. RN informed.   Derwood Kaplan, MD 01/14/17 1901

## 2017-01-15 ENCOUNTER — Encounter (HOSPITAL_COMMUNITY): Payer: Self-pay

## 2017-01-15 DIAGNOSIS — R45851 Suicidal ideations: Secondary | ICD-10-CM

## 2017-01-15 DIAGNOSIS — F332 Major depressive disorder, recurrent severe without psychotic features: Principal | ICD-10-CM

## 2017-01-15 DIAGNOSIS — F10239 Alcohol dependence with withdrawal, unspecified: Secondary | ICD-10-CM

## 2017-01-15 LAB — HEPATIC FUNCTION PANEL
ALT: 379 U/L — ABNORMAL HIGH (ref 14–54)
AST: 414 U/L — ABNORMAL HIGH (ref 15–41)
Albumin: 3.9 g/dL (ref 3.5–5.0)
Alkaline Phosphatase: 190 U/L — ABNORMAL HIGH (ref 38–126)
Bilirubin, Direct: 0.3 mg/dL (ref 0.1–0.5)
Indirect Bilirubin: 0.7 mg/dL (ref 0.3–0.9)
Total Bilirubin: 1 mg/dL (ref 0.3–1.2)
Total Protein: 7.1 g/dL (ref 6.5–8.1)

## 2017-01-15 LAB — LIPASE, BLOOD: Lipase: 53 U/L — ABNORMAL HIGH (ref 11–51)

## 2017-01-15 LAB — PROTIME-INR
INR: 0.97
Prothrombin Time: 12.9 seconds (ref 11.4–15.2)

## 2017-01-15 LAB — AMMONIA: Ammonia: 50 umol/L — ABNORMAL HIGH (ref 9–35)

## 2017-01-15 MED ORDER — NICOTINE 21 MG/24HR TD PT24
21.0000 mg | MEDICATED_PATCH | Freq: Every day | TRANSDERMAL | Status: DC
  Administered 2017-01-15 – 2017-01-28 (×15): 21 mg via TRANSDERMAL
  Filled 2017-01-15 (×20): qty 1

## 2017-01-15 MED ORDER — GABAPENTIN 100 MG PO CAPS
100.0000 mg | ORAL_CAPSULE | Freq: Two times a day (BID) | ORAL | Status: DC
  Administered 2017-01-15 – 2017-01-16 (×2): 100 mg via ORAL
  Filled 2017-01-15 (×4): qty 1

## 2017-01-15 MED ORDER — METHADONE HCL 10 MG PO TABS
160.0000 mg | ORAL_TABLET | Freq: Every day | ORAL | Status: DC
  Administered 2017-01-16 – 2017-01-28 (×13): 160 mg via ORAL
  Filled 2017-01-15 (×5): qty 16
  Filled 2017-01-15: qty 32
  Filled 2017-01-15 (×7): qty 16

## 2017-01-15 MED ORDER — ENSURE ENLIVE PO LIQD
237.0000 mL | Freq: Two times a day (BID) | ORAL | Status: DC
  Administered 2017-01-15 – 2017-01-26 (×13): 237 mL via ORAL

## 2017-01-15 MED ORDER — ESCITALOPRAM OXALATE 10 MG PO TABS
10.0000 mg | ORAL_TABLET | Freq: Every day | ORAL | Status: DC
  Administered 2017-01-15 – 2017-01-16 (×2): 10 mg via ORAL
  Filled 2017-01-15 (×3): qty 1

## 2017-01-15 NOTE — Progress Notes (Signed)
NUTRITION ASSESSMENT  Pt identified as at risk on the Malnutrition Screen Tool  INTERVENTION: 1. Supplements: Ensure Enlive po BID, each supplement provides 350 kcal and 20 grams of protein  NUTRITION DIAGNOSIS: Unintentional weight loss related to sub-optimal intake as evidenced by pt report.   Goal: Pt to meet >/= 90% of their estimated nutrition needs.  Monitor:  PO intake  Assessment:  Pt admitted with ETOH abuse (1000 ml of wine daily). Per chart review, pt was eating 100% of meals in [facility] ED. Pt would benefit from nutritional supplements given increased needs. Will order Ensure supplements.  Height: Ht Readings from Last 1 Encounters:  01/14/17 5\' 1"  (1.549 m)    Weight: Wt Readings from Last 1 Encounters:  01/14/17 115 lb (52.2 kg)    Weight Hx: Wt Readings from Last 10 Encounters:  01/14/17 115 lb (52.2 kg)  02/07/16 123 lb (55.8 kg)  06/16/15 120 lb (54.4 kg)  05/29/15 115 lb 4.8 oz (52.3 kg)    BMI:  Body mass index is 21.73 kg/m. Pt meets criteria for normal based on current BMI.  Estimated Nutritional Needs: Kcal: 25-30 kcal/kg Protein: > 1 gram protein/kg Fluid: 1 ml/kcal  Diet Order: Diet regular Room service appropriate? No; Fluid consistency: Thin Pt is also offered choice of unit snacks mid-morning and mid-afternoon.  Pt is eating as desired.   Lab results and medications reviewed.   07/29/15, MS, RD, LDN Pager: [phone] After Hours Pager: [phone]

## 2017-01-15 NOTE — [facility] Group Notes (Signed)
[facility] LCSW Group Therapy  01/15/2017 2:33 PM  Type of Therapy:  Group Therapy  Participation Level:  Did Not Attend-pt invited. Chose to remain in bed.   Summary of Progress/Problems: Emotion Regulation: This group focused on both positive and negative emotion identification and allowed group members to process ways to identify feelings, regulate negative emotions, and find healthy ways to manage internal/external emotions. Group members were asked to reflect on a time when their reaction to an emotion led to a negative outcome and explored how alternative responses using emotion regulation would have benefited them. Group members were also asked to discuss a time when emotion regulation was utilized when a negative emotion was experienced.   Murel Shenberger N Smart LCSW 01/15/2017, 2:33 PM

## 2017-01-15 NOTE — Progress Notes (Signed)
Pt and CSW met individually to work on aftercare plan. [facility] contacted and it was confirmed that they accept patients who are on methadone maintenance. Pt is interested in a referral. Referral faxed 01/15/2017 3:52 PM  Maxie Better, MSW, LCSW Clinical Social Worker 01/15/2017 3:52 PM

## 2017-01-15 NOTE — Tx Team (Signed)
Interdisciplinary Treatment and Diagnostic Plan Update  01/15/2017 Time of Session: 0930 [place] MRN: [id number]  Principal Diagnosis: Alcohol Use Disorder  Secondary Diagnoses: Active Problems:   MDD (major depressive disorder), recurrent severe, without psychosis (HCC)   Current Medications:  Current Facility-Administered Medications  Medication Dose Route Frequency Provider Last Rate Last Dose  . alum & mag hydroxide-simeth (MAALOX/MYLANTA) 200-200-20 MG/5ML suspension 30 mL  30 mL Oral Q4H PRN [name], NP      . cloNIDine (CATAPRES) tablet 0.1 mg  0.1 mg Oral QID [name], PA-C   Stopped at 01/15/17 0881   Followed by  . [START ON 01/17/2017] cloNIDine (CATAPRES) tablet 0.1 mg  0.1 mg Oral BH-qamhs Simon, Spencer E, PA-C       Followed by  . [START ON 01/19/2017] cloNIDine (CATAPRES) tablet 0.1 mg  0.1 mg Oral QAC breakfast [name], PA-C      . dicyclomine (BENTYL) tablet 20 mg  20 mg Oral Q6H PRN [name], PA-C      . DULoxetine (CYMBALTA) DR capsule 20 mg  20 mg Oral BID [name], PA-C   20 mg at 01/15/17 1031  . hydrOXYzine (ATARAX/VISTARIL) tablet 25 mg  25 mg Oral Q6H PRN [name], NP   25 mg at 01/14/17 2326  . lactulose (CHRONULAC) 10 GM/15ML solution 10 g  10 g Oral TID [name], PA-C   10 g at 01/15/17 5945  . LORazepam (ATIVAN) tablet 1 mg  1 mg Oral Q6H PRN [name], NP   1 mg at 01/14/17 2326  . LORazepam (ATIVAN) tablet 1 mg  1 mg Oral QID [name], NP   1 mg at 01/15/17 8592   Followed by  . [START ON 01/16/2017] LORazepam (ATIVAN) tablet 1 mg  1 mg Oral TID [name], NP       Followed by  . [START ON 01/17/2017] LORazepam (ATIVAN) tablet 1 mg  1 mg Oral BID [name], NP       Followed by  . [START ON 01/19/2017] LORazepam (ATIVAN) tablet 1 mg  1 mg Oral Daily [name], NP      . magnesium hydroxide (MILK OF MAGNESIA) suspension 30 mL  30  mL Oral Daily PRN [name], NP      . methocarbamol (ROBAXIN) tablet 500 mg  500 mg Oral Q8H PRN [name], PA-C      . multivitamin with minerals tablet 1 tablet  1 tablet Oral Daily [name], NP   1 tablet at 01/15/17 9244  . naproxen (NAPROSYN) tablet 500 mg  500 mg Oral BID [name], PA-C   500 mg at 01/15/17 6286  . nicotine (NICODERM CQ - dosed in mg/24 hours) patch 21 mg  21 mg Transdermal Daily [name], MD   21 mg at 01/15/17 0849  . OLANZapine (ZYPREXA) tablet 5 mg  5 mg Oral QHS Donell Sievert E, PA-C   5 mg at 01/14/17 2326  . ondansetron (ZOFRAN-ODT) disintegrating tablet 4 mg  4 mg Oral Q6H PRN [name], NP      . pantoprazole (PROTONIX) EC tablet 40 mg  40 mg Oral Daily [name], PA-C   40 mg at 01/15/17 3817  . thiamine (B-1) injection 100 mg  100 mg Intramuscular Once [name], NP      . thiamine (VITAMIN B-1) tablet 100 mg  100 mg Oral Daily [name], NP   100 mg at 01/15/17 1610  . topiramate (TOPAMAX) tablet 25 mg  25 mg Oral Daily [name], PA-C   25 mg at 01/14/17 2326  . traZODone (DESYREL) tablet 50 mg  50 mg Oral QHS,MR X 1 [name], PA-C   50 mg at 01/14/17 2326   PTA Medications: Prescriptions Prior to Admission  Medication Sig Dispense Refill Last Dose  . acetaminophen (TYLENOL) 325 MG tablet Take 650 mg by mouth every 6 (six) hours as needed for mild pain.   PRN at PRN  . amoxicillin-clavulanate (AUGMENTIN) 875-125 MG tablet Take 1 tablet by mouth every 12 (twelve) hours. (Patient not taking: Reported on 01/13/2017) 20 tablet 0 Not Taking at Unknown time  . methadone (DOLOPHINE) 10 MG/ML solution Take 160 mg by mouth daily.    01/12/2017 at am  . Multiple Vitamin (MULTIVITAMIN WITH MINERALS) TABS tablet Take 1 tablet by mouth 3 (three) times daily. (Patient not taking: Reported on 01/13/2017) 30 tablet 0 Not Taking at Unknown time  . naproxen (NAPROSYN) 250 MG  tablet Take 1 tablet (250 mg total) by mouth 2 (two) times daily with a meal. (Patient not taking: Reported on 01/13/2017) 30 tablet 0 Not Taking at Unknown time  . ondansetron (ZOFRAN ODT) 4 MG disintegrating tablet Take 1 tablet (4 mg total) by mouth every 8 (eight) hours as needed for nausea or vomiting. (Patient not taking: Reported on 01/13/2017) 10 tablet 0 Not Taking at Unknown time    Patient Stressors: Financial difficulties Health problems Legal issue Occupational concerns Substance abuse  Patient Strengths: Capable of independent living Wellsite geologist fund of knowledge Supportive family/friends Work skills  Treatment Modalities: Medication Management, Group therapy, Case management,  1 to 1 session with clinician, Psychoeducation, Recreational therapy.   Physician Treatment Plan for Primary Diagnosis: Alcohol Use Disorder  Medication Management: Evaluate patient's response, side effects, and tolerance of medication regimen.  Therapeutic Interventions: 1 to 1 sessions, Unit Group sessions and Medication administration.  Evaluation of Outcomes: Progressing  Physician Treatment Plan for Secondary Diagnosis: Active Problems:   MDD (major depressive disorder), recurrent severe, without psychosis (HCC)  Long Term Goal(s):     Short Term Goals:       Medication Management: Evaluate patient's response, side effects, and tolerance of medication regimen.  Therapeutic Interventions: 1 to 1 sessions, Unit Group sessions and Medication administration.  Evaluation of Outcomes: Progressing   RN Treatment Plan for Primary Diagnosis: Alcohol Use Disorder Long Term Goal(s): Knowledge of disease and therapeutic regimen to maintain health will improve  Short Term Goals: Ability to remain free from injury will improve, Ability to verbalize feelings will improve and Ability to disclose and discuss suicidal ideas  Medication Management: RN will administer medications as  ordered by provider, will assess and evaluate patient's response and provide education to patient for prescribed medication. RN will report any adverse and/or side effects to prescribing provider.  Therapeutic Interventions: 1 on 1 counseling sessions, Psychoeducation, Medication administration, Evaluate responses to treatment, Monitor vital signs and CBGs as ordered, Perform/monitor CIWA, COWS, AIMS and Fall Risk screenings as ordered, Perform wound care treatments as ordered.  Evaluation of Outcomes: Progressing   LCSW Treatment Plan for Primary Diagnosis: Alcohol Use Disorder Long Term Goal(s): Safe transition to appropriate next level of care at discharge, Engage patient in therapeutic group addressing interpersonal concerns.  Short Term Goals: Engage patient in aftercare planning with referrals and resources, Facilitate  patient progression through stages of change regarding substance use diagnoses and concerns and Identify triggers associated with mental health/substance abuse issues  Therapeutic Interventions: Assess for all discharge needs, 1 to 1 time with Social worker, Explore available resources and support systems, Assess for adequacy in community support network, Educate family and significant other(s) on suicide prevention, Complete Psychosocial Assessment, Interpersonal group therapy.  Evaluation of Outcomes: Progressing   Progress in Treatment: Attending groups: No. New to unit. Continuing to assess.  Participating in groups: No. Taking medication as prescribed: Yes. Toleration medication: Yes. Family/Significant other contact made: No, will contact:  family member/husband if patient consents Patient understands diagnosis: Yes. Discussing patient identified problems/goals with staff: Yes. Medical problems stabilized or resolved: Yes. Denies suicidal/homicidal ideation: Yes. Issues/concerns per patient self-inventory: No. Other: n/a  New problem(s) identified: No,  Describe:  n/a  New Short Term/Long Term Goal(s): detox; elimination of SI thoughts; medication stabilization; development of comprehensive mental wellness/sobriety plan.   Discharge Plan or Barriers: CSW assessing for appropriate referrals. Pt lives with her husband and son; attends methadone clinic every other day for treatment.   Reason for Continuation of Hospitalization: Anxiety Depression Medication stabilization Suicidal ideation Withdrawal symptoms  Estimated Length of Stay: 3-5 days   Attendees: Patient: 01/15/2017 8:53 AM  Physician: Dr. Lucianne Muss MD 01/15/2017 8:53 AM  Nursing: Bud Face RN 01/15/2017 8:53 AM  RN Care Manager: Onnie Boer CM 01/15/2017 8:53 AM  Social Worker: Trula Slade, LCSW 01/15/2017 8:53 AM  Recreational Therapist: x 01/15/2017 8:53 AM  Other: Armandina Stammer NP; Gray Bernhardt NP 01/15/2017 8:53 AM  Other:  01/15/2017 8:53 AM  Other: 01/15/2017 8:53 AM    Scribe for Treatment Team: Ledell Peoples Smart, LCSW 01/15/2017 8:53 AM

## 2017-01-15 NOTE — H&P (Addendum)
Psychiatric Admission Assessment Adult  Patient Identification: Carla Park MRN:  [id number] Date of Evaluation:  01/15/2017 Chief Complaint:  MDD REC SEV WITHOUT PSYCHOTIC FEATURES ALCOHOL USE DISORDER Principal Diagnosis: Major depressive disorder, severe recurrent Alcohol use disorder and dependency, severe Diagnosis:   Patient Active Problem List   Diagnosis Date Noted  . MDD (major depressive disorder), recurrent severe, without psychosis (HCC) [F33.2] 01/14/2017  . Alcohol withdrawal (HCC) [F10.239] 05/29/2015  . Chronic pain [G89.29]   . Fibromyalgia [M79.7]   . 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 Patient is also enrolled in a methadone clinic, has been going to the clinic for several years. Patient states that she's been drinking for about a year now, got turned away from the clinic, and now has to go there every other day to get her methadone.  Patient states that she's been feeling increasingly depressed for a year now, has been drinking excessively for a year and drinks 2-3 500 mL bottles daily. She adds that she also struggles with chronic pain, has fibromyalgia and rheumatoid arthritis.  Patient reports that she knows her health is going downwards due to her alcohol use, as that she's unable to stop and that adds to her depression. She states that she's been clean in regards to opioids for some time now, has been taking methadone for 3-4 years. She reports that she lives with her husband and 60 year old son and knows that she needs to get help so that she can be there for him. She does report that she's had several inpatient admissions in [place], 2 psychiatric admissions and one for rehabilitation.  Patient states that sometimes she feels overwhelmed with her  alcohol use, that she wants to end her life. She has that she tried a couple of weeks ago to cut her wrists but adds that her son walked in and so she stopped. She states that the suicidal thoughts have increased over the last 2-3 weeks and she's not sure she can keep herself safe. She has that she has no plan currently, wants to get help and is willing to contract for safety in the hospital. She also adds that she wants long-term substance use treatment so she can get her life back on track.  Patient denies any psychotic symptoms. She does report anxiety and adds that when she is coming off her alcohol, she gets really anxious and has panic attacks. She states that's what makes her drink again. She denies any history of withdrawal seizures but does report that she has nausea and diarrhea because of her drinking. She also reports that she needs an eye opener in the mornings.  On a scale of 0-10 with 0 being no symptoms in 10 being the worst, patient reports her depression is a 9 out of 10 and adds that her depression has been high for several months now, states that the only medication which is help with her depression the past has been Lexapro. She reports that the Cymbalta does not help.    Associated Signs/Symptoms: Depression Symptoms:  depressed mood, insomnia, fatigue, feelings of worthlessness/guilt, difficulty concentrating, recurrent thoughts of death, panic attacks, disturbed sleep, (Hypo) Manic Symptoms:  Impulsivity, Anxiety Symptoms:  Excessive Worry, Psychotic Symptoms:  Hallucinations: None PTSD Symptoms: Negative Total Time spent with patient:  45 minutes  Past Psychiatric History: As mentioned above. Patient currently does not have an outpatient psychiatrist  Is the patient at risk to self? Yes.    Has the patient been a risk to self in the past 6 months? Yes.    Has the patient been a risk to self within the distant past? Yes.    Is the patient a risk to others? No.   Has the patient been a risk to others in the past 6 months? No.  Has the patient been a risk to others within the distant past? No.   Prior Inpatient Therapy:   Prior Outpatient Therapy:    Alcohol Screening: 1. How often do you have a drink containing alcohol?: 4 or more times a week 2. How many drinks containing alcohol do you have on a typical day when you are drinking?: 10 or more 3. How often do you have six or more drinks on one occasion?: Daily or almost daily Preliminary Score: 8 4. How often during the last year have you found that you were not able to stop drinking once you had started?: Daily or almost daily 5. How often during the last year have you failed to do what was normally expected from you becasue of drinking?: Daily or almost daily 6. How often during the last year have you needed a first drink in the morning to get yourself going after a heavy drinking session?: Daily or almost daily 7. How often during the last year have you had a feeling of guilt of remorse after drinking?: Daily or almost daily 8. How often during the last year have you been unable to remember what happened the night before because you had been drinking?: Weekly 9. Have you or someone else been injured as a result of your drinking?: No 10. Has a relative or friend or a doctor or another health worker been concerned about your drinking or suggested you cut down?: Yes, during the last year Alcohol Use Disorder Identification Test Final Score (AUDIT): 35 Brief Intervention: Yes Substance Abuse History in the last 12 months:  No. Consequences of Substance Abuse: Medical Consequences:  Hep C Withdrawal Symptoms:   Cramps Diarrhea Nausea Tremors Vomiting Previous Psychotropic Medications: Yes  Psychological Evaluations: No  Past Medical History:  Past Medical History:  Diagnosis Date  . Arthritis   . Chronic pain   . Fibromyalgia   . Hep C w/ coma, chronic (HCC)     Past Surgical History:   Procedure Laterality Date  . ABDOMINAL HYSTERECTOMY    . APPENDECTOMY    . CESAREAN SECTION    . CHOLECYSTECTOMY    . KNEE SURGERY     Family History:  Family History  Problem Relation Age of Onset  . Multiple sclerosis Mother    Family Psychiatric  History: As family history of substance use disorder Tobacco Screening: Have you used any form of tobacco in the last 30 days? (Cigarettes, Smokeless Tobacco, Cigars, and/or Pipes): Yes Tobacco use, Select all that apply: 5 or more cigarettes per day Are you interested in Tobacco Cessation Medications?: Yes, will notify MD for an order Counseled patient on smoking cessation including recognizing danger situations, developing coping skills and basic information about quitting provided: Refused/Declined practical counseling Social History:  History  Alcohol Use  . Yes    Comment: 2-500 ml boxes daily at least     History  Drug Use No    Additional Social History: Marital status: Married Number of  Years Married: 8 What types of issues is patient dealing with in the relationship?: "it's a little rocky right now because I'm drinking." Additional relationship information: "he is supportive."  Are you sexually active?: Yes What is your sexual orientation?: heterosexual Has your sexual activity been affected by drugs, alcohol, medication, or emotional stress?: n/a Does patient have children?: Yes How many children?: 1 How is patient's relationship with their children?: "he's been a momma's boy since he was born."     Pain Medications: see MAR Prescriptions: see MAR Over the Counter: see MAR History of alcohol / drug use?: Yes Negative Consequences of Use: Financial, Legal, Personal relationships, Work / School Withdrawal Symptoms: Diarrhea, Tachycardia, Sweats, Irritability, Tremors Name of Substance 1: alcohol 1 - Age of First Use: 11 or 12 1 - Amount (size/oz): 2 or 3 of the 500 ml box wines 1 - Frequency: daily 1 - Duration:  the past year 1 - Last Use / Amount: 01/13/17 Name of Substance 2: opiates 2 - Age of First Use: 16 2 - Amount (size/oz): unknown 2 - Frequency: daily 2 - Duration: years 2 - Last Use / Amount: currently on methadone through clinic                Allergies:   Allergies  Allergen Reactions  . Erythromycin Hives  . Lamictal [Lamotrigine] Dermatitis  . Sumatriptan Other (See Comments)    Becomes angry   Lab Results:  Results for orders placed or performed during the hospital encounter of 01/14/17 (from the past 48 hour(s))  Lipase, blood     Status: Abnormal   Collection Time: 01/15/17  6:33 AM  Result Value Ref Range   Lipase 53 (H) 11 - 51 U/L    Comment: Performed at [facility], [address] [place], [place] [address]  Hepatic function panel     Status: Abnormal   Collection Time: 01/15/17  6:33 AM  Result Value Ref Range   Total Protein 7.1 6.5 - 8.1 g/dL   Albumin 3.9 3.5 - 5.0 g/dL   AST 546 (H) 15 - 41 U/L   ALT 379 (H) 14 - 54 U/L   Alkaline Phosphatase 190 (H) 38 - 126 U/L   Total Bilirubin 1.0 0.3 - 1.2 mg/dL   Bilirubin, Direct 0.3 0.1 - 0.5 mg/dL   Indirect Bilirubin 0.7 0.3 - 0.9 mg/dL    Comment: Performed at [facility], [address], [place], [place] [address]  Protime-INR     Status: None   Collection Time: 01/15/17  6:33 AM  Result Value Ref Range   Prothrombin Time 12.9 11.4 - 15.2 seconds   INR 0.97     Comment: Performed at [facility], [address], [place], [place] [address]    Blood Alcohol level:  Lab Results  Component Value Date   ETH 149 (H) 01/13/2017   ETH <5 06/16/2015    Metabolic Disorder Labs:  No results found for: HGBA1C, MPG No results found for: PROLACTIN No results found for: CHOL, TRIG, HDL, CHOLHDL, VLDL, LDLCALC  Current Medications: Current Facility-Administered Medications  Medication Dose Route Frequency Provider Last Rate Last Dose  . alum &  mag hydroxide-simeth (MAALOX/MYLANTA) 200-200-20 MG/5ML suspension 30 mL  30 mL Oral Q4H PRN [name], NP      . cloNIDine (CATAPRES) tablet 0.1 mg  0.1 mg Oral QID [name], PA-C   0.1 mg at 01/15/17 1208   Followed by  . [  START ON 01/17/2017] cloNIDine (CATAPRES) tablet 0.1 mg  0.1 mg Oral BH-qamhs Simon, Spencer E, PA-C       Followed by  . [START ON 01/19/2017] cloNIDine (CATAPRES) tablet 0.1 mg  0.1 mg Oral QAC breakfast [name], PA-C      . dicyclomine (BENTYL) tablet 20 mg  20 mg Oral Q6H PRN [name], PA-C      . DULoxetine (CYMBALTA) DR capsule 20 mg  20 mg Oral BID [name], PA-C   20 mg at 01/15/17 9892  . feeding supplement (ENSURE ENLIVE) (ENSURE ENLIVE) liquid 237 mL  237 mL Oral BID BM [name], MD   237 mL at 01/15/17 1437  . hydrOXYzine (ATARAX/VISTARIL) tablet 25 mg  25 mg Oral Q6H PRN [name], NP   25 mg at 01/14/17 2326  . lactulose (CHRONULAC) 10 GM/15ML solution 10 g  10 g Oral TID [name], PA-C   10 g at 01/15/17 1208  . LORazepam (ATIVAN) tablet 1 mg  1 mg Oral Q6H PRN [name], NP   1 mg at 01/14/17 2326  . LORazepam (ATIVAN) tablet 1 mg  1 mg Oral QID [name], NP   1 mg at 01/15/17 1208   Followed by  . [START ON 01/16/2017] LORazepam (ATIVAN) tablet 1 mg  1 mg Oral TID [name], NP       Followed by  . [START ON 01/17/2017] LORazepam (ATIVAN) tablet 1 mg  1 mg Oral BID [name], NP       Followed by  . [START ON 01/19/2017] LORazepam (ATIVAN) tablet 1 mg  1 mg Oral Daily [name], NP      . magnesium hydroxide (MILK OF MAGNESIA) suspension 30 mL  30 mL Oral Daily PRN [name], NP      . methocarbamol (ROBAXIN) tablet 500 mg  500 mg Oral Q8H PRN [name], PA-C      . multivitamin with minerals tablet 1 tablet  1 tablet Oral Daily [name], NP   1 tablet at 01/15/17 1194  . naproxen (NAPROSYN) tablet 500 mg   500 mg Oral BID [name], PA-C   500 mg at 01/15/17 1740  . nicotine (NICODERM CQ - dosed in mg/24 hours) patch 21 mg  21 mg Transdermal Daily [name], MD   21 mg at 01/15/17 0849  . OLANZapine (ZYPREXA) tablet 5 mg  5 mg Oral QHS Donell Sievert E, PA-C   5 mg at 01/14/17 2326  . ondansetron (ZOFRAN-ODT) disintegrating tablet 4 mg  4 mg Oral Q6H PRN [name], NP      . pantoprazole (PROTONIX) EC tablet 40 mg  40 mg Oral Daily [name], PA-C   40 mg at 01/15/17 8144  . thiamine (B-1) injection 100 mg  100 mg Intramuscular Once [name], NP      . thiamine (VITAMIN B-1) tablet 100 mg  100 mg Oral Daily [name], NP   100 mg at 01/15/17 8185  . topiramate (TOPAMAX) tablet 25 mg  25 mg Oral Daily [name], PA-C   25 mg at 01/14/17 2326  . traZODone (DESYREL) tablet 50 mg  50 mg Oral QHS,MR X 1 [name], PA-C   50 mg at 01/14/17 2326   PTA Medications: Prescriptions Prior to Admission  Medication Sig Dispense Refill Last Dose  . acetaminophen (TYLENOL)  325 MG tablet Take 650 mg by mouth every 6 (six) hours as needed for mild pain.   PRN at PRN  . amoxicillin-clavulanate (AUGMENTIN) 875-125 MG tablet Take 1 tablet by mouth every 12 (twelve) hours. (Patient not taking: Reported on 01/13/2017) 20 tablet 0 Not Taking at Unknown time  . methadone (DOLOPHINE) 10 MG/ML solution Take 160 mg by mouth daily.    01/12/2017 at am  . Multiple Vitamin (MULTIVITAMIN WITH MINERALS) TABS tablet Take 1 tablet by mouth 3 (three) times daily. (Patient not taking: Reported on 01/13/2017) 30 tablet 0 Not Taking at Unknown time  . naproxen (NAPROSYN) 250 MG tablet Take 1 tablet (250 mg total) by mouth 2 (two) times daily with a meal. (Patient not taking: Reported on 01/13/2017) 30 tablet 0 Not Taking at Unknown time  . ondansetron (ZOFRAN ODT) 4 MG disintegrating tablet Take 1 tablet (4 mg total) by mouth every 8 (eight) hours as needed for nausea or  vomiting. (Patient not taking: Reported on 01/13/2017) 10 tablet 0 Not Taking at Unknown time    Musculoskeletal: Strength & Muscle Tone: within normal limits Gait & Station: normal Patient leans: N/A  Psychiatric Specialty Exam: Physical Exam  Review of Systems  Constitutional: Negative.  Negative for fever and malaise/fatigue.  HENT: Negative.  Negative for congestion and sore throat.   Eyes: Negative.  Negative for blurred vision, double vision and photophobia.  Respiratory: Negative.  Negative for cough and wheezing.   Cardiovascular: Negative.  Negative for chest pain and palpitations.  Gastrointestinal: Positive for abdominal pain, heartburn and nausea. Negative for constipation, diarrhea and vomiting.  Genitourinary: Negative.  Negative for dysuria.  Musculoskeletal: Negative.  Negative for falls and myalgias.  Skin: Negative.  Negative for rash.  Neurological: Negative.  Negative for dizziness, seizures, loss of consciousness and headaches.  Endo/Heme/Allergies: Negative for environmental allergies.  Psychiatric/Behavioral: Positive for depression, hallucinations, substance abuse and suicidal ideas. Negative for memory loss. The patient is nervous/anxious and has insomnia.     Blood pressure 114/76, pulse 96, temperature 97.7 F (36.5 C), temperature source Oral, resp. rate 18, height 5\' 1"  (1.549 m), weight 52.2 kg (115 lb).Body mass index is 21.73 kg/m.  General Appearance: Disheveled  Eye Contact:  Fair  Speech:  Clear and Coherent and Normal Rate  Volume:  Normal  Mood:  Anxious, Depressed, Dysphoric and Hopeless  Affect:  Congruent, Depressed and Tearful  Thought Process:  Coherent and Descriptions of Associations: Intact  Orientation:  Full (Time, Place, and Person)  Thought Content:  Rumination  Suicidal Thoughts:  Yes.  with intent/plan  Homicidal Thoughts:  No  Memory:  Immediate;   Fair Recent;   Fair Remote;   Fair  Judgement:  Impaired  Insight:  Shallow   Psychomotor Activity:  Mannerisms  Concentration:  Concentration: Fair and Attention Span: Fair  Recall:  of Knowledge:  Fair  Language:  Fair  Akathisia:  No  Handed:  Right  AIMS (if indicated):     Assets:  Desire for Improvement Housing Social Support Transportation  ADL's:  Impaired  Cognition:  WNL  Sleep:       Treatment Plan Summary: Plan To start patient on Lexapro 10 mg 1 at bedtime to help her depressionTo discontinue Cymbalta to start Neurontin 100 mg twice a day for alcohol withdrawal  Observation Level/Precautions:  15 minute checks  Laboratory:  Folic Acid Vitamin B-12  Psychotherapy:  to participate in AA groups   Medications:  Lexapro and  Neurontin   Consultations:  None at this time   Discharge Concerns:  For patient to safely and effectively participate in outpatient treatment   Estimated LOS:5 days   Other:     Physician Treatment Plan for Primary Diagnosis: <principal problem not specified> Long Term Goal(s): Improvement in symptoms so as ready for discharge  Short Term Goals: Ability to identify changes in lifestyle to reduce recurrence of condition will improve, Ability to verbalize feelings will improve and Ability to disclose and discuss suicidal ideas  Physician Treatment Plan for Secondary Diagnosis: Active Problems:   MDD (major depressive disorder), recurrent severe, without psychosis (HCC)  Long Term Goal(s): Improvement in symptoms so as ready for discharge  Short Term Goals: Ability to demonstrate self-control will improve, Ability to identify and develop effective coping behaviors will improve, Ability to maintain clinical measurements within normal limits will improve and Ability to identify triggers associated with substance abuse/mental health issues will improve  I certify that inpatient services furnished can reasonably be expected to improve the patient's condition.    [name], MD 5/31/20183:21 PM

## 2017-01-15 NOTE — Progress Notes (Signed)
Carla Park is a 36 year old female being admitted voluntarily to [facility] from [facility].  She came to the ED for suicidal ideation with plan to cut her wrist.  She reported increase in her drinking and admitting to using at least of wine daily.  She has recent DUI and had to go back to every other day at the methadone clinic.  She denied HI but is reporting that she is hearing her husband and son's voice occasionally while she was in the ED.  "I was just a whisper."  She is reporting hopelessness, anxiety, panic attacks, depression, crying spells and anhedonia.  She denies history of seizure during withdrawal but did admit that she heard voices during her last detox attempt.  She is diagnosed with Major Depressive Disorder, without psychotic features and alcohol use disorder.  Oriented her to the unit.  Admission paperwork completed and signed.  Belongings searched and secured in locker # 13.  Skin assessment completed and noted self inflicted laceration to left wrist, rash lower abdomen and lower legs as well as multiple tattoos.  Q 15 minute checks initiated for safety.  We will monitor the progress towards her goals.

## 2017-01-15 NOTE — Tx Team (Signed)
Initial Treatment Plan 01/15/2017 12:51 AM [name] EHU:[id number]    PATIENT STRESSORS: Financial difficulties Health problems Legal issue Occupational concerns Substance abuse   PATIENT STRENGTHS: Capable of independent living Communication skills General fund of knowledge Supportive family/friends Work skills   PATIENT IDENTIFIED PROBLEMS: Depression  Suicidal ideation  Substance abuse  "Get off alcohol"  "Be a better person and a productive member of society"  "Be a better mom for my son"           DISCHARGE CRITERIA:  Improved stabilization in mood, thinking, and/or behavior Motivation to continue treatment in a less acute level of care Verbal commitment to aftercare and medication compliance Withdrawal symptoms are absent or subacute and managed without 24-hour nursing intervention  PRELIMINARY DISCHARGE PLAN: Outpatient therapy Medication management  PATIENT/FAMILY INVOLVEMENT: This treatment plan has been presented to and reviewed with the patient, Carla Park.  The patient and family have been given the opportunity to ask questions and make suggestions.  Levin Bacon, RN 01/15/2017, 12:51 AM

## 2017-01-15 NOTE — Progress Notes (Signed)
D: Patient endorses passive SI but contracts for safety and agrees to come to staff when needed. Patient has a flat affect and depressed mood and forwards little during assessment.  She states she slept "ok" and reports that appetite is fair.  She is preoccupied with wanting her methadone and is otherwise disinterested in discussion beyond that this morning.  She secludes to her room and did not attend morning group.  A: Patient given emotional support from RN. Patient encouraged to come to staff with concerns and/or questions. Patient's medication routine continued. Patient's orders and plan of care reviewed.   R: Patient remains appropriate and cooperative. Will continue to monitor patient q15 minutes for safety.
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
## 2017-01-16 DIAGNOSIS — F17210 Nicotine dependence, cigarettes, uncomplicated: Secondary | ICD-10-CM

## 2017-01-16 DIAGNOSIS — G8929 Other chronic pain: Secondary | ICD-10-CM

## 2017-01-16 DIAGNOSIS — F1120 Opioid dependence, uncomplicated: Secondary | ICD-10-CM

## 2017-01-16 DIAGNOSIS — F10230 Alcohol dependence with withdrawal, uncomplicated: Secondary | ICD-10-CM

## 2017-01-16 DIAGNOSIS — F419 Anxiety disorder, unspecified: Secondary | ICD-10-CM

## 2017-01-16 DIAGNOSIS — M069 Rheumatoid arthritis, unspecified: Secondary | ICD-10-CM

## 2017-01-16 DIAGNOSIS — M797 Fibromyalgia: Secondary | ICD-10-CM

## 2017-01-16 DIAGNOSIS — G4700 Insomnia, unspecified: Secondary | ICD-10-CM

## 2017-01-16 MED ORDER — GABAPENTIN 300 MG PO CAPS
300.0000 mg | ORAL_CAPSULE | Freq: Two times a day (BID) | ORAL | Status: DC
  Administered 2017-01-16 – 2017-01-17 (×2): 300 mg via ORAL
  Filled 2017-01-16 (×5): qty 1

## 2017-01-16 NOTE — Progress Notes (Signed)
Pt had phone screening with [facility] [place] at 11am. CSW spoke with Aggie Cosier who informed CSW that pt is still in review. Hoping for Monday/Tuesday discharge and admission into treatment.  Trula Slade, MSW, LCSW Clinical Social Worker 01/16/2017 2:28 PM

## 2017-01-16 NOTE — Progress Notes (Signed)
Recreation Therapy Notes  Date:  01/16/17 Time: 0930 Location: [address] Dayroom  Group Topic: Stress Management  Goal Area(s) Addresses:  Patient will verbalize importance of using healthy stress management.  Patient will identify positive emotions associated with healthy stress management.   Behavioral Response: Engaged  Intervention: Stress Management  Activity :  Guided Imagery.  LRT introduced the stress management technique of guided imagery.  LRT read a script to allow patients to engage in the activity.  Patients were to follow along with as LRT read the script to participate in the activity.  Education:  Stress Management, Discharge Planning.   Education Outcome: Acknowledges edcuation/In group clarification offered/Needs additional education  Clinical Observations/Feedback: Pt attended group.   Caroll Rancher, LRT/CTRS         Lillia Abed, Yatziry Deakins A 01/16/2017 11:20 AM

## 2017-01-16 NOTE — [facility] Group Notes (Signed)
[facility] LCSW Group Therapy 01/16/2017 1:15pm  Type of Therapy: Group Therapy- Feelings Around Relapse and Recovery  Participation Level: Active   Participation Quality:  Appropriate  Affect:  Appropriate  Cognitive: Alert and Oriented   Insight:  Developing   Engagement in Therapy: Developing/Improving and Engaged   Modes of Intervention: Clarification, Confrontation, Discussion, Education, Exploration, Limit-setting, Orientation, Problem-solving, Rapport Building, Dance movement psychotherapist, Socialization and Support  Summary of Progress/Problems: The topic for today was feelings about relapse. The group discussed what relapse prevention is to them and identified triggers that they are on the path to relapse. Members also processed their feeling towards relapse and were able to relate to common experiences. Group also discussed coping skills that can be used for relapse prevention.  Pt identified times of success in recovery after staying at a long-term recovery house. Pt expressed feeling guilty because her son has started to experiment with drugs.    Therapeutic Modalities:   Cognitive Behavioral Therapy Solution-Focused Therapy Assertiveness Training Relapse Prevention Therapy    Damien Fusi [phone] 01/16/2017 3:25 PM

## 2017-01-16 NOTE — Plan of Care (Signed)
Problem: Safety: Goal: Periods of time without injury will increase Outcome: Progressing Pt has not harmed self or others tonight.  She denies SI/HI and verbally contracts for safety.    

## 2017-01-16 NOTE — Progress Notes (Signed)
D:Pt reports detox symptoms of anxiety, shakiness and irritability. Pt has been out in the dayroom this morning interacting with peers. A:Offered support, encouragement and 15 minute checks. R:Pt denies si and hi. Safety maintained on the unit.

## 2017-01-16 NOTE — Progress Notes (Signed)
[facility] MD Progress Note  01/16/2017 10:39 AM Carla Park  MRN:  [id number] Subjective:  "I have been depressed, anxious and seeking alcohol detox treatment"   Objective: Patient seen, chart reviewed and case discussed with the staff RN. Patient is a 36 year old female who was transferred from [facility] ED for stabilization and treatment of increased drinking, being suicidal with a plan to cut her wrist with a razor blade. Patient is also enrolled in a methadone clinic, has been going to the clinic for several years. Patient states that she's been drinking for about a year now, got turned away from the clinic, and now has to go there every other day to get her methadone. Patient states that she's been feeling increasingly depressed for a year now, has been drinking excessively for a year and drinks 2-3 500 mL bottles daily. She adds that she also struggles with chronic pain, has fibromyalgia and rheumatoid arthritis  On examination patient found lying on her bed, complaining not feeling well especially due to pain in her mouth, tooth pain, alcohol withdrawal symptoms including nausea, vomiting, stomach upset, sweating, shaking and reportedly had a seizure since admitted to the hospital and because self in the mouth. Patient has been seeking more benzodiazepines and also requesting antibiotics, and gabapentin. Patient denied current suicidal/homicidal ideation, intention or plans. Patient has no evidence of auditory/visual hallucinations, delusions and paranoia.  As per staff RN:Pt was in the hallway upon initial approach.  Pt presents with anxious, depressed affect and mood.  She reports "I was very anxious" when asked about her day.  Her goal is to "get my body and mind on the same track to where I don't want to think about drinking."  Pt denies SI/HI, denies hallucinations, reports generalized pain of 7/10.  Pt has been visible in milieu interacting with peers and staff appropriately.  Pt attended evening  group.      Principal Problem: MDD (major depressive disorder), recurrent severe, without psychosis (HCC) Diagnosis:   Patient Active Problem List   Diagnosis Date Noted  . MDD (major depressive disorder), recurrent severe, without psychosis (HCC) [F33.2] 01/14/2017  . 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 Alcohol withdrawal (HCC) [F10.239] 05/29/2015  . Chronic pain [G89.29]   . Fibromyalgia [M79.7]   . 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 Migraines [G43.909] 11/18/2012  . RA (rheumatoid arthritis) (HCC) [M06.9] 11/18/2012   Total Time spent with patient: 30 minutes  Past Psychiatric History: she has been at Methadone clinic over one year and doing well. She has no out patient psychiatric services.  Past Medical History:  Past Medical History:  Diagnosis Date  . Arthritis   . Chronic pain   . Fibromyalgia   . Hep C w/ coma, chronic (HCC)     Past Surgical History:  Procedure Laterality Date  . ABDOMINAL HYSTERECTOMY    . APPENDECTOMY    . CESAREAN SECTION    . CHOLECYSTECTOMY    . KNEE SURGERY     Family History:  Family History  Problem Relation Age of Onset  . Multiple sclerosis Mother    Family Psychiatric  History: Significant of substance abuse Social History:  History  Alcohol Use  . Yes    Comment: 2-500 ml boxes daily at least     History  Drug Use No    Social History   Social History  . Marital status: Single    Spouse name: N/A  .  Number of children: N/A  . Years of education: N/A   Social History Main Topics  . Smoking status: Current Every Day Smoker    Packs/day: 1.00    Types: Cigarettes  . Smokeless tobacco: Never Used  . Alcohol use Yes     Comment: 2-500 ml boxes daily at least  . Drug use: No  . Sexual activity: Yes    Birth control/ protection: Surgical   Other Topics Concern  . None   Social History Narrative  . None   Additional Social History:    Pain Medications:  see MAR Prescriptions: see MAR Over the Counter: see MAR History of alcohol / drug use?: Yes Negative Consequences of Use: Financial, Legal, Personal relationships, Work / School Withdrawal Symptoms: Diarrhea, Tachycardia, Sweats, Irritability, Tremors Name of Substance 1: alcohol 1 - Age of First Use: 11 or 12 1 - Amount (size/oz): 2 or 3 of the 500 ml box wines 1 - Frequency: daily 1 - Duration: the past year 1 - Last Use / Amount: 01/13/17 Name of Substance 2: opiates 2 - Age of First Use: 16 2 - Amount (size/oz): unknown 2 - Frequency: daily 2 - Duration: years 2 - Last Use / Amount: currently on methadone through clinic                Sleep: Fair  Appetite:  Fair  Current Medications: Current Facility-Administered Medications  Medication Dose Route Frequency Provider Last Rate Last Dose  . alum & mag hydroxide-simeth (MAALOX/MYLANTA) 200-200-20 MG/5ML suspension 30 mL  30 mL Oral Q4H PRN [name], NP      . escitalopram (LEXAPRO) tablet 10 mg  10 mg Oral QHS [name], MD   10 mg at 01/15/17 2153  . feeding supplement (ENSURE ENLIVE) (ENSURE ENLIVE) liquid 237 mL  237 mL Oral BID BM [name], MD   237 mL at 01/16/17 0820  . gabapentin (NEURONTIN) capsule 100 mg  100 mg Oral BID [name], MD   100 mg at 01/16/17 0815  . hydrOXYzine (ATARAX/VISTARIL) tablet 25 mg  25 mg Oral Q6H PRN [name], NP   25 mg at 01/15/17 2006  . lactulose (CHRONULAC) 10 GM/15ML solution 10 g  10 g Oral TID Donell Sievert E, PA-C   10 g at 01/16/17 0813  . LORazepam (ATIVAN) tablet 1 mg  1 mg Oral Q6H PRN [name], NP   1 mg at 01/14/17 2326  . LORazepam (ATIVAN) tablet 1 mg  1 mg Oral QID [name], NP   1 mg at 01/16/17 5809   Followed by  . LORazepam (ATIVAN) tablet 1 mg  1 mg Oral TID [name], NP       Followed by  . [START ON 01/17/2017] LORazepam (ATIVAN) tablet 1 mg  1 mg Oral BID [name], NP        Followed by  . [START ON 01/19/2017] LORazepam (ATIVAN) tablet 1 mg  1 mg Oral Daily [name], NP      . magnesium hydroxide (MILK OF MAGNESIA) suspension 30 mL  30 mL Oral Daily PRN [name], NP      . methadone (DOLOPHINE) tablet 160 mg  160 mg Oral Daily Armandina Stammer I, NP   160 mg at 01/16/17 0608  . methocarbamol (ROBAXIN) tablet 500 mg  500 mg Oral Q8H PRN [name], PA-C   500 mg at 01/15/17 2006  . multivitamin with minerals  tablet 1 tablet  1 tablet Oral Daily [name], NP   1 tablet at 01/16/17 3748  . naproxen (NAPROSYN) tablet 500 mg  500 mg Oral BID [name], PA-C   500 mg at 01/16/17 2707  . nicotine (NICODERM CQ - dosed in mg/24 hours) patch 21 mg  21 mg Transdermal Daily [name], MD   21 mg at 01/16/17 0819  . OLANZapine (ZYPREXA) tablet 5 mg  5 mg Oral QHS Donell Sievert E, PA-C   5 mg at 01/15/17 2154  . ondansetron (ZOFRAN-ODT) disintegrating tablet 4 mg  4 mg Oral Q6H PRN [name], NP      . pantoprazole (PROTONIX) EC tablet 40 mg  40 mg Oral Daily [name], PA-C   40 mg at 01/16/17 0815  . thiamine (B-1) injection 100 mg  100 mg Intramuscular Once [name], NP      . thiamine (VITAMIN B-1) tablet 100 mg  100 mg Oral Daily [name], NP   100 mg at 01/16/17 0815  . topiramate (TOPAMAX) tablet 25 mg  25 mg Oral Daily [name], PA-C   25 mg at 01/16/17 8675  . traZODone (DESYREL) tablet 50 mg  50 mg Oral QHS,MR X 1 [name], PA-C   50 mg at 01/15/17 2153    Lab Results:  Results for orders placed or performed during the hospital encounter of 01/14/17 (from the past 48 hour(s))  Lipase, blood     Status: Abnormal   Collection Time: 01/15/17  6:33 AM  Result Value Ref Range   Lipase 53 (H) 11 - 51 U/L    Comment: Performed at [facility], [address] [place], [place] [address]  Hepatic function panel     Status: Abnormal    Collection Time: 01/15/17  6:33 AM  Result Value Ref Range   Total Protein 7.1 6.5 - 8.1 g/dL   Albumin 3.9 3.5 - 5.0 g/dL   AST 100 (H) 15 - 41 U/L   ALT 379 (H) 14 - 54 U/L   Alkaline Phosphatase 190 (H) 38 - 126 U/L   Total Bilirubin 1.0 0.3 - 1.2 mg/dL   Bilirubin, Direct 0.3 0.1 - 0.5 mg/dL   Indirect Bilirubin 0.7 0.3 - 0.9 mg/dL    Comment: Performed at [facility], [address], [place], [place] [address]  Protime-INR     Status: None   Collection Time: 01/15/17  6:33 AM  Result Value Ref Range   Prothrombin Time 12.9 11.4 - 15.2 seconds   INR 0.97     Comment: Performed at [facility], [address], [place], [place] [address]  Ammonia     Status: Abnormal   Collection Time: 01/15/17  6:42 PM  Result Value Ref Range   Ammonia 50 (H) 9 - 35 umol/L    Comment: Performed at [facility], [address] [place], [place] [address]    Blood Alcohol level:  Lab Results  Component Value Date   ETH 149 (H) 01/13/2017   ETH <5 06/16/2015    Metabolic Disorder Labs: No results found for: HGBA1C, MPG No results found for: PROLACTIN No results found for: CHOL, TRIG, HDL, CHOLHDL, VLDL, LDLCALC  Physical Findings: AIMS: Facial and Oral Movements Muscles of Facial Expression: None, normal Lips and Perioral Area: None, normal Jaw: None, normal Tongue: None, normal,Extremity Movements Upper (arms, wrists, hands, fingers): None, normal  Lower (legs, knees, ankles, toes): None, normal, Trunk Movements Neck, shoulders, hips: None, normal, Overall Severity Severity of abnormal movements (highest score from questions above): None, normal Incapacitation due to abnormal movements: None, normal Patient's awareness of abnormal movements (rate only patient's report): No Awareness, Dental Status Current problems with teeth and/or dentures?: No Does patient usually wear dentures?: No  CIWA:  CIWA-Ar Total: 6 COWS:  COWS  Total Score: 7  Musculoskeletal: Strength & Muscle Tone: within normal limits Gait & Station: normal Patient leans: N/A  Psychiatric Specialty Exam: Physical Exam  ROS  Blood pressure 116/71, pulse (!) 108, temperature 98.1 F (36.7 C), temperature source Oral, resp. rate 16, height 5\' 1"  (1.549 m), weight 52.2 kg (115 lb).Body mass index is 21.73 kg/m.  General Appearance: Guarded  Eye Contact:  Good  Speech:  Slow  Volume:  Decreased  Mood:  Anxious and Dysphoric  Affect:  Depressed and Tearful  Thought Process:  Coherent and Goal Directed  Orientation:  Full (Time, Place, and Person)  Thought Content:  Rumination  Suicidal Thoughts:  Yes.  without intent/plan  Homicidal Thoughts:  No  Memory:  Immediate;   Good Recent;   Fair Remote;   Fair  Judgement:  Impaired  Insight:  Fair  Psychomotor Activity:  Decreased  Concentration:  Concentration: Fair and Attention Span: Fair  Recall:  Good  Fund of Knowledge:  Good  Language:  Good  Akathisia:  Negative  Handed:  Right  AIMS (if indicated):     Assets:  Communication Skills Desire for Improvement Financial Resources/Insurance Housing Intimacy Leisure Time Resilience Social Support Talents/Skills Transportation  ADL's:  Intact  Cognition:  WNL  Sleep:  Number of Hours: 6.75     Treatment Plan Summary: Daily contact with patient to assess and evaluate symptoms and progress in treatment and Medication management   1. Will maintain Q 15 minutes observation for safety. Estimated LOS: 5-7 days 2. Patient will participate in group, milieu, and family therapy. Psychotherapy: Social and Doctor, hospital, anti-bullying, learning based strategies, cognitive behavioral, and family object relations individuation separation intervention psychotherapies can be considered.  3. Review of labs show elevated liver enzymes - will monitor and repeat in few days 4. Alcohol withdrawal: Continue Ativan detox  protocol 5. Depression: not improving lexapro 10 mg daily for depression.  6. Anxiety: Increase Gabapentin 300 mg po twice daily. 7. Opioid dependence: Continue methadone 8. Mood swings: Continue Zyprexa 5 mg qhs  9. Chronic pain: Methocarbamol 500 mg Q8H/PRN and naproxen 500 mg BID 10. Insomnia : Trazodone 50 mg Qhs and may repeat x 1 as needed 11. Will continue to monitor patient's mood and behavior. 12. Social Work will schedule a Family meeting to obtain collateral information and discuss discharge and follow up plan. Discharge concerns will also be addressed: Safety, stabilization, and access to medication  Leata Mouse, MD 01/16/2017, 10:39 AM

## 2017-01-16 NOTE — Plan of Care (Signed)
Problem: Activity: Goal: Interest or engagement in activities will improve Outcome: Progressing Pt has been out of her room more and interacting in the dayroom.

## 2017-01-16 NOTE — Progress Notes (Signed)
Adult Psychoeducational Group Note  Date:  01/16/2017 Time:  10:49 AM  Group Topic/Focus:  Relapse Prevention Planning:   The focus of this group is to define relapse and discuss the need for planning to combat relapse.  Participation Level:  Active  Participation Quality:  Appropriate  Affect:  Appropriate  Cognitive:  Alert  Insight: Appropriate  Engagement in Group:  Engaged  Modes of Intervention:  Discussion  Additional Comments:  Pt and I talked one to one in the dayroom.  Pt states that she has had a opiate problem in the past but is now addicted to alcohol.  Pt states that she started drinking because of depression and anxiety.  Pt hopes to go to [facility] treatment [facility] when she discharges.  Dreyah Montrose R Madeline Bebout 01/16/2017, 10:49 AMTHERAPIST PROGRESS NOTE  Session Time:  Participation Level:  Behavioral Response:  Type of Therapy:  Individual Therapy  Treatment Goals addressed:  Interventions:  Summary:  Suicidal/Homicidal:  Therapist Response:  Plan:  Javiana Anwar R Kasumi Ditullio

## 2017-01-16 NOTE — Progress Notes (Signed)
D: Pt was in the hallway upon initial approach.  Pt presents with anxious, depressed affect and mood.  She reports "I was very anxious" when asked about her day.  Her goal is to "get my body and mind on the same track to where I don't want to think about drinking."  Pt denies SI/HI, denies hallucinations, reports generalized pain of 7/10.  Pt has been visible in milieu interacting with peers and staff appropriately.  Pt attended evening group.    A: Introduced self to pt.  Actively listened to pt and offered support and encouragement. Medications administered per order.  PRN medication administered for anxiety and pain.  Medication education provided.  Q15 minute safety checks maintained.  R: Pt is safe on the unit.  Pt is compliant with medications.  Pt verbally contracts for safety.  Will continue to monitor and assess.

## 2017-01-16 NOTE — Progress Notes (Signed)
The patient attended a portion of the evening A.A.meeting before returning to her room.

## 2017-01-16 NOTE — [facility] Suicide Risk Assessment (Signed)
[facility] INPATIENT:  Family/Significant Other Suicide Prevention Education  Suicide Prevention Education:  Education Completed; Taja Pentland (pt's [phone]  has been identified by the patient as the family member/significant other with whom the patient will be residing, and identified as the person(s) who will aid the patient in the event of a mental health crisis (suicidal ideations/suicide attempt).  With written consent from the patient, the family member/significant other has been provided the following suicide prevention education, prior to the and/or following the discharge of the patient.  The suicide prevention education provided includes the following:  Suicide risk factors  Suicide prevention and interventions  National Suicide Hotline telephone number  [facility] assessment telephone number  [facility] Emergency Assistance 911  [facility] and/or Residential Mobile Crisis Unit telephone number  Request made of family/significant other to:  Remove weapons (e.g., guns, rifles, knives), all items previously/currently identified as safety concern.    Remove drugs/medications (over-the-counter, prescriptions, illicit drugs), all items previously/currently identified as a safety concern.  The family member/significant other verbalizes understanding of the suicide prevention education information provided.  The family member/significant other agrees to remove the items of safety concern listed above.  [place]ho[place]ia Pirie N Smart LCSW 01/16/2017, 2:11 PM

## 2017-01-17 DIAGNOSIS — F39 Unspecified mood [affective] disorder: Secondary | ICD-10-CM

## 2017-01-17 MED ORDER — ESCITALOPRAM OXALATE 20 MG PO TABS
20.0000 mg | ORAL_TABLET | Freq: Every day | ORAL | Status: DC
  Administered 2017-01-17 – 2017-01-27 (×11): 20 mg via ORAL
  Filled 2017-01-17 (×2): qty 1
  Filled 2017-01-17: qty 2
  Filled 2017-01-17 (×4): qty 1
  Filled 2017-01-17: qty 2
  Filled 2017-01-17 (×5): qty 1
  Filled 2017-01-17: qty 2
  Filled 2017-01-17 (×2): qty 1

## 2017-01-17 MED ORDER — TRAZODONE HCL 100 MG PO TABS
100.0000 mg | ORAL_TABLET | Freq: Every day | ORAL | Status: DC
  Administered 2017-01-17 – 2017-01-27 (×10): 100 mg via ORAL
  Filled 2017-01-17 (×15): qty 1

## 2017-01-17 MED ORDER — GABAPENTIN 300 MG PO CAPS
300.0000 mg | ORAL_CAPSULE | Freq: Three times a day (TID) | ORAL | Status: DC
  Administered 2017-01-17 – 2017-01-28 (×33): 300 mg via ORAL
  Filled 2017-01-17 (×38): qty 1

## 2017-01-17 MED ORDER — GABAPENTIN 300 MG PO CAPS
300.0000 mg | ORAL_CAPSULE | Freq: Once | ORAL | Status: AC
  Administered 2017-01-17: 300 mg via ORAL
  Filled 2017-01-17: qty 1

## 2017-01-17 NOTE — [facility] Group Notes (Signed)
[facility] LCSW Group Therapy Note  01/17/2017  and  10:00 AM  Type of Therapy and Topic:  Group Therapy: Avoiding Self-Sabotaging and Enabling Behaviors  Participation Level:  Did Not Attend; invited to participate yet did not despite overhead announcement and encouragement by staff     Carney Bern, LCSW

## 2017-01-17 NOTE — Progress Notes (Signed)
Data. Patient denies HI. She continues to endorse passive SI, "But I can't do anything here." She also reports, "The ceiling was moving last night and I think people are calling or whispering my name. Patient is able to verbally contract for safety while on the unit and to come to staff before acting on any SI thoughts/feelings. Patient spent most of the shift in her room. When she did come out of her room, she reported feeling extremely anxious and agitated by all the noise an the unit. She is also very tremulous. She is A & O x 4.  Action. Emotional support and encouragement offered. Education provided on medication, indications and side effect. Q 15 minute checks done for safety. Response. Safety on the unit maintained through 15 minute checks.  Medications taken as prescribed. Remained calm and appropriate through out shift.

## 2017-01-17 NOTE — Progress Notes (Signed)
Writer spoke with patient 1:1 and she was very anxious and tearful. She reports that her day has been pretty rough and she feel like a failure because her son has started to experiment with thc. She feels that she has not set a good example for him as his mother. She eoprts that she wants to feel better about herself and not always feel that she has to turn to drinking. Support given and encouraged her to not be so hard on herself. Prn medications givne to help with her withdrawal symptoms and anxiety. Safety maintained on unit with 15 min checks.

## 2017-01-17 NOTE — [facility] Group Notes (Signed)
[facility] Group Notes:  (Nursing/MHT/Case Management/Adjunct)  Date:  01/17/2017  Time:  2:59 PM  Type of Therapy:  Nurse Education  Participation Level:  Active  Participation Quality:  Appropriate  Affect:  Appropriate  Cognitive:  Appropriate  Insight:  Appropriate  Engagement in Group:  Engaged  Modes of Intervention:  Discussion and Education  Summary of Progress/Problems:  This was a healthy coping skills development group.   Almira Bar 01/17/2017, 2:59 PM

## 2017-01-17 NOTE — Progress Notes (Signed)
[facility] MD Progress Note  01/17/2017 3:40 PM Carla Park  MRN:  [id number]   Subjective:  Patient reports " I wasn't feeling well this morning, but  I am feeling a little better now that my RA is not flared up." - reports taken tylenol to help with her pain.  Objective: Carla Park is awake, alert and oriented *3. Seen resting in bed.  Denies suicidal or homicidal ideation during this assessment. However states passive suicidal ideations at times. Denies auditory or visual hallucination and does not appear to be responding to internal stimuli.  Patient reports she is not resting well and is requesting medication adjustment to her Neurontin.  Patient reports she is medication compliant without mediation side effects. States her depression 810. Reports good appetite and states she is not resting well throughout the night. Support, encouragement and reassurance was provided.   Principal Problem: MDD (major depressive disorder), recurrent severe, without psychosis (HCC) Diagnosis:   Patient Active Problem List   Diagnosis Date Noted  . MDD (major depressive disorder), recurrent severe, without psychosis (HCC) [F33.2] 01/14/2017  . 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 Alcohol withdrawal (HCC) [F10.239] 05/29/2015  . Chronic pain [G89.29]   . Fibromyalgia [M79.7]   . 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 Migraines [G43.909] 11/18/2012  . RA (rheumatoid arthritis) (HCC) [M06.9] 11/18/2012   Total Time spent with patient: 30 minutes  Past Psychiatric History: she has been at Methadone clinic over one year and doing well. She has no out patient psychiatric services.  Past Medical History:  Past Medical History:  Diagnosis Date  . Arthritis   . Chronic pain   . Fibromyalgia   . Hep C w/ coma, chronic (HCC)     Past Surgical History:  Procedure Laterality Date  . ABDOMINAL HYSTERECTOMY    . APPENDECTOMY    . CESAREAN SECTION    .  CHOLECYSTECTOMY    . KNEE SURGERY     Family History:  Family History  Problem Relation Age of Onset  . Multiple sclerosis Mother    Family Psychiatric  History: Significant of substance abuse Social History:  History  Alcohol Use  . Yes    Comment: 2-500 ml boxes daily at least     History  Drug Use No    Social History   Social History  . Marital status: Single    Spouse name: N/A  . Number of children: N/A  . Years of education: N/A   Social History Main Topics  . Smoking status: Current Every Day Smoker    Packs/day: 1.00    Types: Cigarettes  . Smokeless tobacco: Never Used  . Alcohol use Yes     Comment: 2-500 ml boxes daily at least  . Drug use: No  . Sexual activity: Yes    Birth control/ protection: Surgical   Other Topics Concern  . None   Social History Narrative  . None   Additional Social History:    Pain Medications: see MAR Prescriptions: see MAR Over the Counter: see MAR History of alcohol / drug use?: Yes Negative Consequences of Use: Financial, Legal, Personal relationships, Work / School Withdrawal Symptoms: Diarrhea, Tachycardia, Sweats, Irritability, Tremors Name of Substance 1: alcohol 1 - Age of First Use: 11 or 12 1 - Amount (size/oz): 2 or 3 of the 500 ml box wines 1 - Frequency: daily 1 - Duration: the past year 1 - Last Use / Amount:  01/13/17 Name of Substance 2: opiates 2 - Age of First Use: 16 2 - Amount (size/oz): unknown 2 - Frequency: daily 2 - Duration: years 2 - Last Use / Amount: currently on methadone through clinic                Sleep: Fair  Appetite:  Fair  Current Medications: Current Facility-Administered Medications  Medication Dose Route Frequency Provider Last Rate Last Dose  . alum & mag hydroxide-simeth (MAALOX/MYLANTA) 200-200-20 MG/5ML suspension 30 mL  30 mL Oral Q4H PRN [name], NP      . escitalopram (LEXAPRO) tablet 10 mg  10 mg Oral QHS [name], MD   10 mg at  01/16/17 2105  . feeding supplement (ENSURE ENLIVE) (ENSURE ENLIVE) liquid 237 mL  237 mL Oral BID BM [name], MD   237 mL at 01/17/17 1434  . gabapentin (NEURONTIN) capsule 300 mg  300 mg Oral BID Leata Mouse, MD   300 mg at 01/17/17 0804  . hydrOXYzine (ATARAX/VISTARIL) tablet 25 mg  25 mg Oral Q6H PRN [name], NP   25 mg at 01/16/17 1502  . lactulose (CHRONULAC) 10 GM/15ML solution 10 g  10 g Oral TID Donell Sievert E, PA-C   10 g at 01/17/17 1138  . LORazepam (ATIVAN) tablet 1 mg  1 mg Oral Q6H PRN [name], NP   1 mg at 01/17/17 1141  . LORazepam (ATIVAN) tablet 1 mg  1 mg Oral BID [name], NP       Followed by  . [START ON 01/19/2017] LORazepam (ATIVAN) tablet 1 mg  1 mg Oral Daily [name], NP      . magnesium hydroxide (MILK OF MAGNESIA) suspension 30 mL  30 mL Oral Daily PRN [name], NP      . methadone (DOLOPHINE) tablet 160 mg  160 mg Oral Daily Armandina Stammer I, NP   160 mg at 01/17/17 0803  . methocarbamol (ROBAXIN) tablet 500 mg  500 mg Oral Q8H PRN [name], PA-C   500 mg at 01/16/17 2105  . multivitamin with minerals tablet 1 tablet  1 tablet Oral Daily [name], NP   1 tablet at 01/17/17 0804  . naproxen (NAPROSYN) tablet 500 mg  500 mg Oral BID [name], PA-C   500 mg at 01/17/17 0804  . nicotine (NICODERM CQ - dosed in mg/24 hours) patch 21 mg  21 mg Transdermal Daily [name], MD   21 mg at 01/17/17 0803  . OLANZapine (ZYPREXA) tablet 5 mg  5 mg Oral QHS Donell Sievert E, PA-C   5 mg at 01/16/17 2105  . ondansetron (ZOFRAN-ODT) disintegrating tablet 4 mg  4 mg Oral Q6H PRN [name], NP      . pantoprazole (PROTONIX) EC tablet 40 mg  40 mg Oral Daily [name], PA-C   40 mg at 01/17/17 0804  . thiamine (B-1) injection 100 mg  100 mg Intramuscular Once [name], NP      . thiamine (VITAMIN B-1) tablet 100 mg  100 mg Oral Daily  [name], NP   100 mg at 01/17/17 0806  . topiramate (TOPAMAX) tablet 25 mg  25 mg Oral Daily [name], PA-C   25 mg at 01/17/17 9518  . traZODone (DESYREL) tablet 50 mg  50 mg Oral QHS,MR X 1 Donell Sievert E, PA-C   50 mg at 01/16/17  2252    Lab Results:  Results for orders placed or performed during the hospital encounter of 01/14/17 (from the past 48 hour(s))  Ammonia     Status: Abnormal   Collection Time: 01/15/17  6:42 PM  Result Value Ref Range   Ammonia 50 (H) 9 - 35 umol/L    Comment: Performed at [facility], [address], [place], [place] [address]    Blood Alcohol level:  Lab Results  Component Value Date   ETH 149 (H) 01/13/2017   ETH <5 06/16/2015    Metabolic Disorder Labs: No results found for: HGBA1C, MPG No results found for: PROLACTIN No results found for: CHOL, TRIG, HDL, CHOLHDL, VLDL, LDLCALC  Physical Findings: AIMS: Facial and Oral Movements Muscles of Facial Expression: None, normal Lips and Perioral Area: None, normal Jaw: None, normal Tongue: None, normal,Extremity Movements Upper (arms, wrists, hands, fingers): None, normal Lower (legs, knees, ankles, toes): None, normal, Trunk Movements Neck, shoulders, hips: None, normal, Overall Severity Severity of abnormal movements (highest score from questions above): None, normal Incapacitation due to abnormal movements: None, normal Patient's awareness of abnormal movements (rate only patient's report): No Awareness, Dental Status Current problems with teeth and/or dentures?: No Does patient usually wear dentures?: No  CIWA:  CIWA-Ar Total: 9 COWS:  COWS Total Score: 7  Musculoskeletal: Strength & Muscle Tone: within normal limits Gait & Station: normal Patient leans: N/A  Psychiatric Specialty Exam: Physical Exam  Nursing note and vitals reviewed. Constitutional: She is oriented to person, place, and time. She appears well-developed.  HENT:  Head:  Normocephalic.  Neurological: She is alert and oriented to person, place, and time.  Psychiatric: She has a normal mood and affect. Her behavior is normal.    Review of Systems  Psychiatric/Behavioral: Positive for depression. The patient is nervous/anxious and has insomnia.     Blood pressure 118/80, pulse 96, temperature 98.5 F (36.9 C), temperature source Oral, resp. rate 16, height 5\' 1"  (1.549 m), weight 52.2 kg (115 lb).Body mass index is 21.73 kg/m.  General Appearance: Casual and Guarded  Eye Contact:  Good  Speech:  Slow  Volume:  Decreased  Mood:  Anxious and Dysphoric  Affect:  Depressed and Flat  Thought Process:  Coherent and Goal Directed  Orientation:  Full (Time, Place, and Person)  Thought Content:  Rumination  Suicidal Thoughts:  Yes.  without intent/plan  Homicidal Thoughts:  No  Memory:  Immediate;   Good Recent;   Fair Remote;   Fair  Judgement:  Impaired  Insight:  Fair  Psychomotor Activity:  Decreased  Concentration:  Concentration: Fair and Attention Span: Fair  Recall:  Good  Fund of Knowledge:  Good  Language:  Good  Akathisia:  Negative  Handed:  Right  AIMS (if indicated):     Assets:  Communication Skills Desire for Improvement Financial Resources/Insurance Housing Talents/Skills Transportation  ADL's:  Intact  Cognition:  WNL  Sleep:  Number of Hours: 6.25     I agree with current treatment plan on 01/17/2017, Patient seen face-to-face for psychiatric evaluation follow-up, chart reviewed and case discussed.  Reviewed the information documented and agree with the treatment plan.  Treatment Plan Summary: Daily contact with patient to assess and evaluate symptoms and progress in treatment and Medication management   Continue with current treatment plan list on 01/17/2017 expected where noted.  Will maintain Q 15 minutes observation for safety. Estimated LOS: 5-7 days 1. Patient will participate in group, milieu, and  family therapy.  Psychotherapy: Social and Doctor, hospital, anti-bullying, learning based strategies, cognitive behavioral, and family object relations individuation separation intervention psychotherapies can be considered.  2. Review of labs show elevated liver enzymes - will monitor and repeat in few days 3. Alcohol withdrawal: Continue Ativan detox protocol 4. Depression: Incresed lexapro 10 mg to 20mg   daily for depression.  5. Anxiety: Increased Gabapentin 300 mg po twice daily to TID 6. Opioid dependence: Continue methadone 7. Mood swings: Continue Zyprexa 5 mg qhs  8. Chronic pain: Methocarbamol 500 mg Q8H/PRN and naproxen 500 mg BID 9. Insomnia :  Increased Trazodone 50 mg to 100 mg  Qhs  10. Will continue to monitor patient's mood and behavior. 11. Social Work will schedule a Family meeting to obtain collateral information and discuss discharge and follow up plan. Discharge concerns will also be addressed: Safety, stabilization, and access to medication  , NP 01/17/2017, 3:40 PM

## 2017-01-17 NOTE — Progress Notes (Signed)
D.  Pt pleasant but anxious on approach, remains tremulous.  Pt was positive for evening AA group, interacting appropriately with peers on the unit.  Pt continues to endorse passive SI but contracts for safety on unit.  Pt continues to experience some AVH but states less than previous.  A.  Support and encouragement offered, medication given as ordered  R.  Pt remains safe on the unit, will continue to monitor.

## 2017-01-17 NOTE — Progress Notes (Signed)
Patient did attend the evening speaker AA meeting.  

## 2017-01-18 MED ORDER — LORAZEPAM 1 MG PO TABS
1.0000 mg | ORAL_TABLET | Freq: Once | ORAL | Status: AC
  Administered 2017-01-18: 1 mg via ORAL
  Filled 2017-01-18: qty 1

## 2017-01-18 NOTE — Progress Notes (Signed)
Patient did attend the evening speaker AA meeting.  

## 2017-01-18 NOTE — [facility] Group Notes (Signed)
[facility] LCSW Group Therapy  01/18/2017 10 AM  Type of Therapy:  Group Therapy  Participation Level:  Active  Participation Quality:  Intrusive, Monopolizing and Sharing  Affect:  Anxious  Cognitive:  Disorganized  Insight:  Limited  Engagement in Therapy:  Engaged  Modes of Intervention:  Discussion, Education, Exploration, Socialization and Support  Summary of Progress/Problems: Topic for today was thoughts and feelings regarding discharge. We discussed fears of upcoming changes including judgements, expectations and stigma of mental health issues. We then discussed supports: what constitutes a supportive framework, identification of supports and what to do when others are not supportive. Patients had an opportunity to process the quote: "A joy not shared is cut in half, a pain not shared is doubled." Patient was intrusive with comments which were extensively long wherein it seemed pt had difficulty tracking. Patient feels mental health issues are misunderstood yet has difficulty taking responsibility for what she can control.   Carney Bern, LCSW

## 2017-01-18 NOTE — Progress Notes (Addendum)
D.  Pt extremely tremulous and anxious on approach, complaint of auditory hallucinations and visual "shadows".  Pt states this becomes worse in the evening and that she often wakes up unsure of where she is.  She states that last night she woke up and thought she was in someone's house and went out into the hall looking for the bathroom.  Pt very distressed about this.  Pt did attend evening AA group, and is interacting appropriately with peers on the unit.  Pt denies HI, continues to endorse some passive SI but contracts for safety on the unit.  A.  Support and encouragement offered, medication given as ordered.  Spoke to NP about Pt's complaints and new order received.  Will pass this information off in report for day shift doctor to be aware of as well.   R.  Pt remains safe on the unit, will continue to monitor.

## 2017-01-18 NOTE — Progress Notes (Signed)
Patient ID: Carla Park, female   DOB: January 30, 1981, 36 y.o.   MRN: [id number]  DAR: Pt. Denies HI and A/V Hallucinations. She reports passive SI however is able to contract for safety. She reports sleep is good, appetite is good, energy level is low, and concentration is poor. She rates depression 7/10, hopelessness 6/10, and anxiety 10/10. Patient does some pain which is decreased with medication. Support and encouragement provided to the patient. Scheduled medications administered to patient per physician's orders. Patient is seen in the milieu mostly interacting with her roommate. She is cooperative and pleasant with staff at this time. She presents with tremors and reports withdrawal symptoms including diarrhea, cravings, agitation, irritability, and chilling. Q15 minute checks are maintained for safety.

## 2017-01-18 NOTE — Progress Notes (Signed)
[facility] MD Progress Note  01/18/2017 7:44 AM Carla Park  MRN:  [id number]   Subjective:  Patient reports " I am okay, Reports calling [facility], however states it hard finding a program that will accept me because of the methadone."  Objective: Carla Park is awake, alert and oriented *3. Seen standing in the hall talking to staff. patient reports she is hopeful to find inpatient treatment today. Reports she has a plan to work with the Child psychotherapist and substances abuse counselor. Denies suicidal or homicidal ideations at this time. States she attended group session on yesterday, which was the first one since she has been here. States is was attending AA meeting regularly and reports she was 4 years sober and would like to get back to that point. Reports she is medications compliant and reports resting well.  Support, encouragement and reassurance was provided.   Principal Problem: MDD (major depressive disorder), recurrent severe, without psychosis (HCC) Diagnosis:   Patient Active Problem List   Diagnosis Date Noted  . MDD (major depressive disorder), recurrent severe, without psychosis (HCC) [F33.2] 01/14/2017  . 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 Alcohol withdrawal (HCC) [F10.239] 05/29/2015  . Chronic pain [G89.29]   . Fibromyalgia [M79.7]   . 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 Migraines [G43.909] 11/18/2012  . RA (rheumatoid arthritis) (HCC) [M06.9] 11/18/2012   Total Time spent with patient: 30 minutes  Past Psychiatric History: she has been at Methadone clinic over one year and doing well. She has no out patient psychiatric services.  Past Medical History:  Past Medical History:  Diagnosis Date  . Arthritis   . Chronic pain   . Fibromyalgia   . Hep C w/ coma, chronic (HCC)     Past Surgical History:  Procedure Laterality Date  . ABDOMINAL HYSTERECTOMY    . APPENDECTOMY    . CESAREAN SECTION    .  CHOLECYSTECTOMY    . KNEE SURGERY     Family History:  Family History  Problem Relation Age of Onset  . Multiple sclerosis Mother    Family Psychiatric  History: Significant of substance abuse Social History:  History  Alcohol Use  . Yes    Comment: 2-500 ml boxes daily at least     History  Drug Use No    Social History   Social History  . Marital status: Single    Spouse name: N/A  . Number of children: N/A  . Years of education: N/A   Social History Main Topics  . Smoking status: Current Every Day Smoker    Packs/day: 1.00    Types: Cigarettes  . Smokeless tobacco: Never Used  . Alcohol use Yes     Comment: 2-500 ml boxes daily at least  . Drug use: No  . Sexual activity: Yes    Birth control/ protection: Surgical   Other Topics Concern  . None   Social History Narrative  . None   Additional Social History:    Pain Medications: see MAR Prescriptions: see MAR Over the Counter: see MAR History of alcohol / drug use?: Yes Negative Consequences of Use: Financial, Legal, Personal relationships, Work / School Withdrawal Symptoms: Diarrhea, Tachycardia, Sweats, Irritability, Tremors Name of Substance 1: alcohol 1 - Age of First Use: 11 or 12 1 - Amount (size/oz): 2 or 3 of the 500 ml box wines 1 - Frequency: daily 1 - Duration: the past year 1 - Last  Use / Amount: 01/13/17 Name of Substance 2: opiates 2 - Age of First Use: 16 2 - Amount (size/oz): unknown 2 - Frequency: daily 2 - Duration: years 2 - Last Use / Amount: currently on methadone through clinic                Sleep: Fair  Appetite:  Fair  Current Medications: Current Facility-Administered Medications  Medication Dose Route Frequency Provider Last Rate Last Dose  . alum & mag hydroxide-simeth (MAALOX/MYLANTA) 200-200-20 MG/5ML suspension 30 mL  30 mL Oral Q4H PRN [name], NP      . escitalopram (LEXAPRO) tablet 20 mg  20 mg Oral QHS Oneta Rack, NP   20 mg at  01/17/17 2115  . feeding supplement (ENSURE ENLIVE) (ENSURE ENLIVE) liquid 237 mL  237 mL Oral BID BM [name], MD   237 mL at 01/17/17 1434  . gabapentin (NEURONTIN) capsule 300 mg  300 mg Oral TID Oneta Rack, NP   300 mg at 01/17/17 1650  . hydrOXYzine (ATARAX/VISTARIL) tablet 25 mg  25 mg Oral Q6H PRN [name], NP   25 mg at 01/16/17 1502  . lactulose (CHRONULAC) 10 GM/15ML solution 10 g  10 g Oral TID Donell Sievert E, PA-C   10 g at 01/17/17 1650  . LORazepam (ATIVAN) tablet 1 mg  1 mg Oral BID [name], NP   1 mg at 01/17/17 1649   Followed by  . [START ON 01/19/2017] LORazepam (ATIVAN) tablet 1 mg  1 mg Oral Daily [name], NP      . magnesium hydroxide (MILK OF MAGNESIA) suspension 30 mL  30 mL Oral Daily PRN [name], NP      . methadone (DOLOPHINE) tablet 160 mg  160 mg Oral Daily Armandina Stammer I, NP   160 mg at 01/17/17 0803  . methocarbamol (ROBAXIN) tablet 500 mg  500 mg Oral Q8H PRN [name], PA-C   500 mg at 01/17/17 2115  . multivitamin with minerals tablet 1 tablet  1 tablet Oral Daily [name], NP   1 tablet at 01/17/17 0804  . naproxen (NAPROSYN) tablet 500 mg  500 mg Oral BID [name], PA-C   500 mg at 01/17/17 1650  . nicotine (NICODERM CQ - dosed in mg/24 hours) patch 21 mg  21 mg Transdermal Daily [name], MD   21 mg at 01/17/17 0803  . OLANZapine (ZYPREXA) tablet 5 mg  5 mg Oral QHS Donell Sievert E, PA-C   5 mg at 01/17/17 2116  . pantoprazole (PROTONIX) EC tablet 40 mg  40 mg Oral Daily [name], PA-C   40 mg at 01/17/17 0804  . thiamine (B-1) injection 100 mg  100 mg Intramuscular Once [name], NP      . thiamine (VITAMIN B-1) tablet 100 mg  100 mg Oral Daily [name], NP   100 mg at 01/17/17 0806  . topiramate (TOPAMAX) tablet 25 mg  25 mg Oral Daily [name], PA-C   25 mg at 01/17/17 6283  . traZODone (DESYREL) tablet 100 mg  100  mg Oral QHS Oneta Rack, NP   100 mg at 01/17/17 2115    Lab Results:  No results found for this or any previous visit (from the past 48 hour(s)).  Blood Alcohol level:  Lab Results  Component Value Date   ETH 149 (H) 01/13/2017  ETH <5 06/16/2015    Metabolic Disorder Labs: No results found for: HGBA1C, MPG No results found for: PROLACTIN No results found for: CHOL, TRIG, HDL, CHOLHDL, VLDL, LDLCALC  Physical Findings: AIMS: Facial and Oral Movements Muscles of Facial Expression: None, normal Lips and Perioral Area: None, normal Jaw: None, normal Tongue: None, normal,Extremity Movements Upper (arms, wrists, hands, fingers): None, normal Lower (legs, knees, ankles, toes): None, normal, Trunk Movements Neck, shoulders, hips: None, normal, Overall Severity Severity of abnormal movements (highest score from questions above): None, normal Incapacitation due to abnormal movements: None, normal Patient's awareness of abnormal movements (rate only patient's report): No Awareness, Dental Status Current problems with teeth and/or dentures?: No Does patient usually wear dentures?: No  CIWA:  CIWA-Ar Total: 7 COWS:  COWS Total Score: 6  Musculoskeletal: Strength & Muscle Tone: within normal limits Gait & Station: normal Patient leans: N/A  Psychiatric Specialty Exam: Physical Exam  Nursing note and vitals reviewed. Constitutional: She is oriented to person, place, and time. She appears well-developed.  HENT:  Head: Normocephalic.  Cardiovascular: Normal rate.   Neurological: She is alert and oriented to person, place, and time.  Psychiatric: She has a normal mood and affect. Her behavior is normal.    Review of Systems  Psychiatric/Behavioral: Positive for depression. Suicidal ideas: fleeting thoughts. The patient is nervous/anxious and has insomnia.     Blood pressure (!) 134/105, pulse 91, temperature 98.5 F (36.9 C), temperature source Oral, resp. rate 18, height  5\' 1"  (1.549 m), weight 52.2 kg (115 lb).Body mass index is 21.73 kg/m.  General Appearance: Fairly Groomed  Eye Contact:  Good  Speech:  Slow  Volume:  Normal  Mood:  Anxious and Dysphoric  Affect:  Depressed and Flat  Thought Process:  Coherent and Goal Directed  Orientation:  Full (Time, Place, and Person)  Thought Content:  Rumination  Suicidal Thoughts:  No fleeting thoughts  Homicidal Thoughts:  No  Memory:  Immediate;   Good Recent;   Fair Remote;   Fair  Judgement:  Impaired  Insight:  Fair  Psychomotor Activity:  Decreased  Concentration:  Concentration: Fair and Attention Span: Fair  Recall:  Good  Fund of Knowledge:  Good  Language:  Good  Akathisia:  Negative  Handed:  Right  AIMS (if indicated):     Assets:  Communication Skills Desire for Improvement Financial Resources/Insurance Housing Talents/Skills Transportation  ADL's:  Intact  Cognition:  WNL  Sleep:  Number of Hours: 6.5     I agree with current treatment plan on 01/18/2017, Patient seen face-to-face for psychiatric evaluation follow-up, chart reviewed and case discussed.  Reviewed the information documented and agree with the treatment plan.  Treatment Plan Summary: Daily contact with patient to assess and evaluate symptoms and progress in treatment and Medication management   Continue with current treatment plan list on 01/18/2017 expected where noted. Medication management:  1. Alcohol withdrawal: Continue Ativan detox protocol 2. Depression: Lexapro 20mg   daily for depression.  3. Anxiety: Continue  Gabapentin 300 mg po TID 4. Opioid dependence: Continue methadone 5. Mood swings: Continue Zyprexa 5 mg qhs  6. Chronic pain: Methocarbamol 500 mg Q8H/PRN and naproxen 500 mg BID 7. Insomnia : Continue Trazodone 100 mg  Qhs  8. Will continue to monitor patient's mood and behavior. Discharge concerns will also be addressed: Safety, stabilization, and access to medication  03/20/2017,  NP 01/18/2017, 7:44 AM

## 2017-01-18 NOTE — [facility] Group Notes (Signed)
[facility] Group Notes: Healthy Coping Skills  Date:  01/18/2017  Time:  4:13 PM  Type of Therapy:  Psychoeducational Skills  Participation Level:  Active  Participation Quality:  Appropriate  Affect:  Anxious  Cognitive:  Alert and Appropriate  Insight:  Appropriate  Engagement in Group:  Engaged  Modes of Intervention:  Discussion and Education  Summary of Progress/Problems: Patient attended group however came in late.  Marzetta Board E 01/18/2017, 4:13 PM

## 2017-01-19 MED ORDER — BISACODYL 10 MG RE SUPP
10.0000 mg | Freq: Once | RECTAL | Status: AC
  Administered 2017-01-19: 10 mg via RECTAL
  Filled 2017-01-19 (×2): qty 1

## 2017-01-19 MED ORDER — OLANZAPINE 5 MG PO TBDP
5.0000 mg | ORAL_TABLET | Freq: Once | ORAL | Status: AC
  Administered 2017-01-19: 5 mg via ORAL
  Filled 2017-01-19 (×2): qty 1

## 2017-01-19 NOTE — Plan of Care (Signed)
Problem: Medication: Goal: Compliance with prescribed medication regimen will improve Outcome: Progressing Pt is compliant with medication regimen   

## 2017-01-19 NOTE — [facility] Group Notes (Signed)
[facility] LCSW Group Therapy  01/19/2017 3:53 PM  Type of Therapy:  Group Therapy  Participation Level:  Did Not Attend-pt invited. Chose to remain in bed.   Summary of Progress/Problems: Today's Topic: Overcoming Obstacles. Patients identified one short term goal and potential obstacles in reaching this goal. Patients processed barriers involved in overcoming these obstacles. Patients identified steps necessary for overcoming these obstacles and explored motivation (internal and external) for facing these difficulties head on.    Carla Park N Smart LCSW 01/19/2017, 3:53 PM

## 2017-01-19 NOTE — Progress Notes (Signed)
Patient ID: Carla Park, female   DOB: [birth year], 36 y.o.   MRN: [id number]  DAR: Pt. Denies HI and visual Hallucinations. She endorses passive SI but reports she is able to contract for safety. She does report auditory hallucinations in the form of music. She received one time dose of Zyprexa 5 mg and this appeared to help patient. Patient appears more calm and in less distress per writer observation. She reports sleep is good, appetite is good, energy level is low, and concentration is poor. She rates depression 7/10, hopelessness 7/10, and anxiety 9/10. She reports withdrawal symptoms including tremors, cravings, agitation, irritability, and runny nose. Patient does report pain and received scheduled medications which provided relief. Support and encouragement provided to the patient. Scheduled medications administered to patient per physician's orders. Patient is seen in the milieu intermittently. Q15 minute checks are maintained for safety.

## 2017-01-19 NOTE — Progress Notes (Signed)
Recreation Therapy Notes  Date: 01/19/17 Time: 0930 Location: 300 Hall Group Room  Group Topic: Stress Management  Goal Area(s) Addresses:  Patient will verbalize importance of using healthy stress management.  Patient will identify positive emotions associated with healthy stress management.   Intervention: Stress Management  Activity :  LRT introduced the stress management technique of meditation.  LRT played a meditation on gratitude to allow patients to engage in the meditation.  Patients were to follow along as the meditation played to fully participate in the activity.   Education:  Stress Management, Discharge Planning.   Education Outcome: Acknowledges edcuation/In group clarification offered/Needs additional education  Clinical Observations/Feedback: Pt did not attend group.   Caroll Rancher, LRT/CTRS         Caroll Rancher A 01/19/2017 11:47 AM

## 2017-01-19 NOTE — Progress Notes (Signed)
[facility] MD Progress Note  01/19/2017 1:51 PM Carla Park  MRN:  [id number]   Subjective:  Patient reports "  I am feeling okay,  I am just tired today."   Objective: Carla Park seen resting in bedroom, after taken her morning  medications. Per staff patient has concerns with auditory and visual  Hallucinations, Reports patient is requesting Ativan to help with her symptoms. Patient continues to endorse passive thoughts of self harm. Reports her  thoughts are intermittent.Patient reports she hasn't been able to attend group session as much as she would like however with make attempt to go this afternoon. However patient appears to be future and goal oriented with discussion of discharge and follow-up treatment. Patient reports a good appetite and states she is resting well. Support, encouragement and reassurance was provided.   Principal Problem: MDD (major depressive disorder), recurrent severe, without psychosis (HCC) Diagnosis:   Patient Active Problem List   Diagnosis Date Noted  . MDD (major depressive disorder), recurrent severe, without psychosis (HCC) [F33.2] 01/14/2017  . 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 Alcohol withdrawal (HCC) [F10.239] 05/29/2015  . Chronic pain [G89.29]   . Fibromyalgia [M79.7]   . 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 Migraines [G43.909] 11/18/2012  . RA (rheumatoid arthritis) (HCC) [M06.9] 11/18/2012   Total Time spent with patient: 30 minutes  Past Psychiatric History: she has been at Methadone clinic over one year and doing well. She has no out patient psychiatric services.  Past Medical History:  Past Medical History:  Diagnosis Date  . Arthritis   . Chronic pain   . Fibromyalgia   . Hep C w/ coma, chronic (HCC)     Past Surgical History:  Procedure Laterality Date  . ABDOMINAL HYSTERECTOMY    . APPENDECTOMY    . CESAREAN SECTION    . CHOLECYSTECTOMY    . KNEE SURGERY     Family  History:  Family History  Problem Relation Age of Onset  . Multiple sclerosis Mother    Family Psychiatric  History: Significant of substance abuse Social History:  History  Alcohol Use  . Yes    Comment: 2-500 ml boxes daily at least     History  Drug Use No    Social History   Social History  . Marital status: Single    Spouse name: N/A  . Number of children: N/A  . Years of education: N/A   Social History Main Topics  . Smoking status: Current Every Day Smoker    Packs/day: 1.00    Types: Cigarettes  . Smokeless tobacco: Never Used  . Alcohol use Yes     Comment: 2-500 ml boxes daily at least  . Drug use: No  . Sexual activity: Yes    Birth control/ protection: Surgical   Other Topics Concern  . None   Social History Narrative  . None   Additional Social History:    Pain Medications: see MAR Prescriptions: see MAR Over the Counter: see MAR History of alcohol / drug use?: Yes Negative Consequences of Use: Financial, Legal, Personal relationships, Work / School Withdrawal Symptoms: Diarrhea, Tachycardia, Sweats, Irritability, Tremors Name of Substance 1: alcohol 1 - Age of First Use: 11 or 12 1 - Amount (size/oz): 2 or 3 of the 500 ml box wines 1 - Frequency: daily 1 - Duration: the past year 1 - Last Use / Amount: 01/13/17 Name of Substance 2: opiates 2 -  Age of First Use: 16 2 - Amount (size/oz): unknown 2 - Frequency: daily 2 - Duration: years 2 - Last Use / Amount: currently on methadone through clinic                Sleep: Fair  Appetite:  Fair  Current Medications: Current Facility-Administered Medications  Medication Dose Route Frequency Provider Last Rate Last Dose  . alum & mag hydroxide-simeth (MAALOX/MYLANTA) 200-200-20 MG/5ML suspension 30 mL  30 mL Oral Q4H PRN [name], NP      . escitalopram (LEXAPRO) tablet 20 mg  20 mg Oral QHS Oneta Rack, NP   20 mg at 01/18/17 2116  . feeding supplement (ENSURE ENLIVE)  (ENSURE ENLIVE) liquid 237 mL  237 mL Oral BID BM [name], MD   237 mL at 01/19/17 0825  . gabapentin (NEURONTIN) capsule 300 mg  300 mg Oral TID Oneta Rack, NP   300 mg at 01/19/17 1159  . hydrOXYzine (ATARAX/VISTARIL) tablet 25 mg  25 mg Oral Q6H PRN [name], NP   25 mg at 01/19/17 1330  . lactulose (CHRONULAC) 10 GM/15ML solution 10 g  10 g Oral TID Donell Sievert E, PA-C   10 g at 01/19/17 1158  . magnesium hydroxide (MILK OF MAGNESIA) suspension 30 mL  30 mL Oral Daily PRN [name], NP      . methadone (DOLOPHINE) tablet 160 mg  160 mg Oral Daily Armandina Stammer I, NP   160 mg at 01/19/17 0826  . methocarbamol (ROBAXIN) tablet 500 mg  500 mg Oral Q8H PRN [name], PA-C   500 mg at 01/18/17 2116  . multivitamin with minerals tablet 1 tablet  1 tablet Oral Daily [name], NP   1 tablet at 01/19/17 1610  . naproxen (NAPROSYN) tablet 500 mg  500 mg Oral BID [name], PA-C   500 mg at 01/19/17 9604  . nicotine (NICODERM CQ - dosed in mg/24 hours) patch 21 mg  21 mg Transdermal Daily [name], MD   21 mg at 01/19/17 0824  . OLANZapine (ZYPREXA) tablet 5 mg  5 mg Oral QHS Donell Sievert E, PA-C   5 mg at 01/18/17 2116  . OLANZapine zydis (ZYPREXA) disintegrating tablet 5 mg  5 mg Oral Once Oneta Rack, NP      . pantoprazole (PROTONIX) EC tablet 40 mg  40 mg Oral Daily Donell Sievert E, PA-C   40 mg at 01/19/17 5409  . thiamine (B-1) injection 100 mg  100 mg Intramuscular Once [name], NP      . thiamine (VITAMIN B-1) tablet 100 mg  100 mg Oral Daily [name], NP   100 mg at 01/19/17 8119  . topiramate (TOPAMAX) tablet 25 mg  25 mg Oral Daily [name], PA-C   25 mg at 01/19/17 1478  . traZODone (DESYREL) tablet 100 mg  100 mg Oral QHS Oneta Rack, NP   100 mg at 01/18/17 2116    Lab Results:  No results found for this or any previous visit (from the past 48 hour(s)).  Blood Alcohol  level:  Lab Results  Component Value Date   ETH 149 (H) 01/13/2017   ETH <5 06/16/2015    Metabolic Disorder Labs: No results found for: HGBA1C, MPG No results found for: PROLACTIN No results found for: CHOL, TRIG, HDL, CHOLHDL, VLDL, LDLCALC  Physical Findings: AIMS: Facial and Oral Movements Muscles  of Facial Expression: None, normal Lips and Perioral Area: None, normal Jaw: None, normal Tongue: None, normal,Extremity Movements Upper (arms, wrists, hands, fingers): None, normal Lower (legs, knees, ankles, toes): None, normal, Trunk Movements Neck, shoulders, hips: None, normal, Overall Severity Severity of abnormal movements (highest score from questions above): None, normal Incapacitation due to abnormal movements: None, normal Patient's awareness of abnormal movements (rate only patient's report): No Awareness, Dental Status Current problems with teeth and/or dentures?: No Does patient usually wear dentures?: No  CIWA:  CIWA-Ar Total: 6 COWS:  COWS Total Score: 8  Musculoskeletal: Strength & Muscle Tone: within normal limits Gait & Station: normal Patient leans: N/A  Psychiatric Specialty Exam: Physical Exam  Nursing note and vitals reviewed. Constitutional: She is oriented to person, place, and time. She appears well-developed.  HENT:  Head: Normocephalic.  Cardiovascular: Normal rate.   Neurological: She is alert and oriented to person, place, and time.  Psychiatric: She has a normal mood and affect. Her behavior is normal.    Review of Systems  Psychiatric/Behavioral: Positive for depression. Suicidal ideas: fleeting thoughts. The patient is nervous/anxious and has insomnia.     Blood pressure 108/63, pulse 61, temperature 97.9 F (36.6 C), temperature source Oral, resp. rate 16, height 5\' 1"  (1.549 m), weight 52.2 kg (115 lb).Body mass index is 21.73 kg/m.  General Appearance: Fairly Groomed , flat, low energy   Eye Contact:  Good  Speech:  Slow  Volume:   Normal  Mood:  Anxious and Dysphoric  Affect:  Depressed and Flat  Thought Process:  Coherent and Goal Directed  Orientation:  Full (Time, Place, and Person)  Thought Content:  Hallucinations: Auditory Visual and Rumination  Suicidal Thoughts:  No fleeting thoughts  Homicidal Thoughts:  No  Memory:  Immediate;   Good Recent;   Fair Remote;   Fair  Judgement:  Impaired  Insight:  Fair  Psychomotor Activity:  Decreased- some improvement   Concentration:  Concentration: Fair and Attention Span: Fair  Recall:  Good  Fund of Knowledge:  Good  Language:  Good  Akathisia:  Negative  Handed:  Right  AIMS (if indicated):     Assets:  Communication Skills Desire for Improvement Financial Resources/Insurance Housing Talents/Skills Transportation  ADL's:  Intact  Cognition:  WNL  Sleep:  Number of Hours: 6.75     I agree with current treatment plan on 01/19/2017, Patient seen face-to-face for psychiatric evaluation follow-up, chart reviewed and case discussed.  Reviewed the information documented and agree with the treatment plan.  Treatment Plan Summary: Daily contact with patient to assess and evaluate symptoms and progress in treatment and Medication management   Continue with current treatment plan list on 01/18/2017 expected where noted. Medication management:  1. Alcohol withdrawal: Continue Ativan detox protocol 2. Depression: Lexapro 20mg   daily for depression.  3. Anxiety: Continue  Gabapentin 300 mg po TID 4. Opioid dependence: Continue methadone 5. Mood swings: Continue Zyprexa 5 mg qhs,  6. Chronic pain: Methocarbamol 500 mg Q8H/PRN and naproxen 500 mg BID 7. Insomnia : Continue Trazodone 100 mg  Qhs  8. Will continue to monitor patient's mood and behavior. Discharge concerns will also be addressed: Safety, stabilization, and access to medication  Oneta Rack, NP 01/19/2017, 1:51 PM  Agree with NP progress note

## 2017-01-20 DIAGNOSIS — B1920 Unspecified viral hepatitis C without hepatic coma: Secondary | ICD-10-CM

## 2017-01-20 DIAGNOSIS — K769 Liver disease, unspecified: Secondary | ICD-10-CM

## 2017-01-20 DIAGNOSIS — F29 Unspecified psychosis not due to a substance or known physiological condition: Secondary | ICD-10-CM

## 2017-01-20 DIAGNOSIS — F1994 Other psychoactive substance use, unspecified with psychoactive substance-induced mood disorder: Secondary | ICD-10-CM

## 2017-01-20 MED ORDER — METHOCARBAMOL 500 MG PO TABS
500.0000 mg | ORAL_TABLET | Freq: Four times a day (QID) | ORAL | Status: DC | PRN
  Administered 2017-01-20 – 2017-01-27 (×6): 500 mg via ORAL
  Filled 2017-01-20 (×6): qty 1

## 2017-01-20 MED ORDER — HALOPERIDOL 5 MG PO TABS
ORAL_TABLET | ORAL | Status: AC
  Filled 2017-01-20: qty 1

## 2017-01-20 MED ORDER — HALOPERIDOL 5 MG PO TABS
5.0000 mg | ORAL_TABLET | Freq: Two times a day (BID) | ORAL | Status: DC
  Administered 2017-01-20 – 2017-01-24 (×9): 5 mg via ORAL
  Filled 2017-01-20 (×15): qty 1

## 2017-01-20 NOTE — Progress Notes (Signed)
D: Pt at the time of assessment was flat, isolative and withdrawn to her room. Pt endorsed moderate anxiety, depression, pain and VH. Pt is Ativan seeking; "I hear voices and the doctor told me that I probably came off of the protocol too soon; he said he was going to restart my Ativan but all I'm getting is Zyprexa; that is not working." Pt denied SI or HI.  A: Medications offered as prescribed. All patient's questions and concerns addressed. Support, encouragement, and safe environment provided. Will continue to monitor for any changes. 15-minute safety checks continue. R: Pt was med compliant.  Pt attended wrap-up group. Safety checks continue.

## 2017-01-20 NOTE — Progress Notes (Signed)
Nursing Note: 0700-1900  D:  Pt presents with anxious mood and affect.  Rates depression as 7/10, hopelessness 7/10 and anxiety 9/10. States that she continues to have hallucinations, now both auditory and visual.  "Today I saw a girl in the corner of my room asking if she could spend the night, I asked my roommate if it was ok- later I realized it was a hallucination."  "I hear a child's voice (like my sons when he was little) talking in the backround." Pt asking for Ativan. Per pt, most important is: "Working on my delusions and FMLA." On Self inventory, pt states that she is experiencing tremors, chills, cravings,cramping, agitation, nausea, runny nose and irritability.  Also, that she does have SI but no plan, agrees to come to staff if worsens.  She is able to verbally contract for safety.  A:  Explained that she is finished with Ativan protocol, she accepted this without complaint. Encouraged to verbalize needs, active listening and support provided.  Continued Q 15 minute safety checks.    R:  Pt. tearful and and needing attention for different needs throughout shift.  PRN meds given as ordered for anxiety.  Pt. able to verbally contract for safety.

## 2017-01-20 NOTE — Progress Notes (Signed)
Recreation Therapy Notes  Animal-Assisted Activity (AAA) Program Checklist/Progress Notes Patient Eligibility Criteria Checklist & Daily Group note for Rec TxIntervention  Date: 01/20/2017 Time: 1:55pm Location: 400 hall dayroom   AAA/T Program Assumption of Risk Form signed by Patient/ or Parent Legal Guardian Yes  Patient is free of allergies or sever asthma Yes  Patient reports no fear of animals Yes  Patient reports no history of cruelty to animals Yes  Patient understands his/her participation is voluntary Yes  Behavioral Response: Patient did not attend.  Carla Park, Recreation Therapy Intern  

## 2017-01-20 NOTE — Progress Notes (Addendum)
[facility] MD Progress Note  01/20/2017 4:47 PM Carla Park  MRN:  [id number] Subjective:   36 yo Caucasian female, married, lives with her family. 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 Voluntary admission on account of suicidal thoughts. Expressed plans to cut self with a razor. Has been very distressed lately. Recent loss of privileges at the Methadone clinic and DUI as her main stressors. BAL was 149 mg/dl at presentation. UDS was negative. Lipase is elevated but trending down. Elevated lAST, ALT and serum alkaline phosphatase.   Chart reviewed today. Patient discussed at team  Staff reports that patient has been hallucination. She reports visual and auditory hallucinations. She hears the voice of kids. No command to hurt self or others. No aggressive behavior in here. She has been seeking benzodiazepines. Her vital signs has been stable.   Seen today. Reports a lot of distress from hallucinations. Hears children. No command to hurt self. Sees things but notes that they disappear when she does focus more. Says she has never had this before. Feels overwhelmed by it. Says she still has suicidal thoughts as she is not able to cope. Olanzapine has not helped. No use of synthetic substances. No family history of psychosis. We discussed trial of Haldol. Patient consented to treatment after we explored the risks and benefits.   Principal Problem: MDD (major depressive disorder), recurrent severe, without psychosis ([place]) Diagnosis:   Patient Active Problem List   Diagnosis Date Noted  . MDD (major depressive disorder), recurrent severe, without psychosis ([place]) [F33.2] 01/14/2017  . Long-term current use of methadone for opiate dependence ([place]) [F11.20] 06/18/2015  . Alcohol withdrawal ([place]) [F10.239] 05/29/2015  . Chronic pain [G89.29]   . Fibromyalgia [M79.7]   . Hep C w/ coma, chronic ([place]) [B18.2]   . Migraines [G43.909] 11/18/2012  . RA (rheumatoid arthritis) ([place]) [M06.9] 11/18/2012    Total Time spent with patient: 20 minutes  Past Psychiatric History: As in H&P  Past Medical History:  Past Medical History:  Diagnosis Date  . Arthritis   . Chronic pain   . Fibromyalgia   . Hep C w/ coma, chronic (HCC)     Past Surgical History:  Procedure Laterality Date  . ABDOMINAL HYSTERECTOMY    . APPENDECTOMY    . CESAREAN SECTION    . CHOLECYSTECTOMY    . KNEE SURGERY     Family History:  Family History  Problem Relation Age of Onset  . Multiple sclerosis Mother    Family Psychiatric  History: As in H&P Social History:  History  Alcohol Use  . Yes    Comment: 2-500 ml boxes daily at least     History  Drug Use No    Social History   Social History  . Marital status: Single    Spouse name: N/A  . Number of children: N/A  . Years of education: N/A   Social History Main Topics  . Smoking status: Current Every Day Smoker    Packs/day: 1.00    Types: Cigarettes  . Smokeless tobacco: Never Used  . Alcohol use Yes     Comment: 2-500 ml boxes daily at least  . Drug use: No  . Sexual activity: Yes    Birth control/ protection: Surgical   Other Topics Concern  . None   Social History Narrative  . None   Additional Social History:    Pain Medications: see MAR Prescriptions: see MAR Over the Counter: see MAR History of alcohol / drug use?: Yes  Negative Consequences of Use: Financial, Legal, Personal relationships, Work / School Withdrawal Symptoms: Diarrhea, Tachycardia, Sweats, Irritability, Tremors Name of Substance 1: alcohol 1 - Age of First Use: 11 or 12 1 - Amount (size/oz): 2 or 3 of the 500 ml box wines 1 - Frequency: daily 1 - Duration: the past year 1 - Last Use / Amount: 01/13/17 Name of Substance 2: opiates 2 - Age of First Use: 16 2 - Amount (size/oz): unknown 2 - Frequency: daily 2 - Duration: years 2 - Last Use / Amount: currently on methadone through clinic     Sleep: Good  Appetite:  Good  Current  Medications: Current Facility-Administered Medications  Medication Dose Route Frequency Provider Last Rate Last Dose  . alum & mag hydroxide-simeth (MAALOX/MYLANTA) 200-200-20 MG/5ML suspension 30 mL  30 mL Oral Q4H PRN Ethelene Hal, NP      . escitalopram (LEXAPRO) tablet 20 mg  20 mg Oral QHS Derrill Center, NP   20 mg at 01/19/17 2107  . feeding supplement (ENSURE ENLIVE) (ENSURE ENLIVE) liquid 237 mL  237 mL Oral BID BM Hampton Abbot, MD   237 mL at 01/20/17 1000  . gabapentin (NEURONTIN) capsule 300 mg  300 mg Oral TID Derrill Center, NP   300 mg at 01/20/17 1159  . hydrOXYzine (ATARAX/VISTARIL) tablet 25 mg  25 mg Oral Q6H PRN Ethelene Hal, NP   25 mg at 01/20/17 1033  . lactulose (CHRONULAC) 10 GM/15ML solution 10 g  10 g Oral TID Patriciaann Clan E, PA-C   10 g at 01/20/17 1159  . magnesium hydroxide (MILK OF MAGNESIA) suspension 30 mL  30 mL Oral Daily PRN Ethelene Hal, NP      . methadone (DOLOPHINE) tablet 160 mg  160 mg Oral Daily Lindell Spar I, NP   160 mg at 01/20/17 0813  . methocarbamol (ROBAXIN) tablet 500 mg  500 mg Oral Q6H PRN Laverle Hobby, PA-C   500 mg at 01/20/17 0054  . multivitamin with minerals tablet 1 tablet  1 tablet Oral Daily Ethelene Hal, NP   1 tablet at 01/20/17 0813  . naproxen (NAPROSYN) tablet 500 mg  500 mg Oral BID WC Patriciaann Clan E, PA-C   500 mg at 01/20/17 7616  . nicotine (NICODERM CQ - dosed in mg/24 hours) patch 21 mg  21 mg Transdermal Daily Hampton Abbot, MD   21 mg at 01/20/17 0816  . OLANZapine (ZYPREXA) tablet 5 mg  5 mg Oral QHS Patriciaann Clan E, PA-C   5 mg at 01/19/17 2107  . pantoprazole (PROTONIX) EC tablet 40 mg  40 mg Oral Daily Laverle Hobby, PA-C   40 mg at 01/20/17 0813  . thiamine (B-1) injection 100 mg  100 mg Intramuscular Once Ethelene Hal, NP      . thiamine (VITAMIN B-1) tablet 100 mg  100 mg Oral Daily Ethelene Hal, NP   100 mg at 01/20/17 0737  . topiramate  (TOPAMAX) tablet 25 mg  25 mg Oral Daily Laverle Hobby, PA-C   25 mg at 01/20/17 0813  . traZODone (DESYREL) tablet 100 mg  100 mg Oral QHS Derrill Center, NP   100 mg at 01/19/17 2107    Lab Results: No results found for this or any previous visit (from the past 48 hour(s)).  Blood Alcohol level:  Lab Results  Component Value Date   ETH 149 (H) 01/13/2017   ETH <5 06/16/2015  Metabolic Disorder Labs: No results found for: HGBA1C, MPG No results found for: PROLACTIN No results found for: CHOL, TRIG, HDL, CHOLHDL, VLDL, LDLCALC  Physical Findings: AIMS: Facial and Oral Movements Muscles of Facial Expression: None, normal Lips and Perioral Area: None, normal Jaw: None, normal Tongue: None, normal,Extremity Movements Upper (arms, wrists, hands, fingers): None, normal Lower (legs, knees, ankles, toes): None, normal, Trunk Movements Neck, shoulders, hips: None, normal, Overall Severity Severity of abnormal movements (highest score from questions above): None, normal Incapacitation due to abnormal movements: None, normal Patient's awareness of abnormal movements (rate only patient's report): No Awareness, Dental Status Current problems with teeth and/or dentures?: No Does patient usually wear dentures?: No  CIWA:  CIWA-Ar Total: 14 COWS:  COWS Total Score: 8  Musculoskeletal: Strength & Muscle Tone: within normal limits Gait & Station: normal Patient leans: N/A  Psychiatric Specialty Exam: Physical Exam  Constitutional: She is oriented to person, place, and time. She appears well-developed and well-nourished.  HENT:  Head: Normocephalic and atraumatic.  Eyes: Conjunctivae are normal. Pupils are equal, round, and reactive to light.  Neck: Normal range of motion. Neck supple.  Cardiovascular: Normal rate and regular rhythm.   Respiratory: Effort normal and breath sounds normal.  GI: Soft.  Musculoskeletal: Normal range of motion.  Neurological: She is alert and  oriented to person, place, and time.  Skin: Skin is warm and dry.  Psychiatric:  As above    ROS  Blood pressure 105/68, pulse 98, temperature 97.7 F (36.5 C), temperature source Oral, resp. rate 16, height _0  (1.549 m), weight 52.2 kg (115 lb).Body mass index is 21.73 kg/m.  General Appearance: Tearful and distressed.   Eye Contact:  Fair  Speech:  Normal Rate  Volume:  Decreased  Mood:  Dysphoric  Affect:  Blunted and mood congruent.   Thought Process:  Linear  Orientation:  Full (Time, Place, and Person)  Thought Content:  Hallucinations  Suicidal Thoughts:  Yes.  without intent/plan  Homicidal Thoughts:  No  Memory:  Unable to assess at this time.   Judgement:  Poor  Insight:  Good  Psychomotor Activity:  Decreased  Concentration:  Impaired   Recall:  Unable to assess at this time.   Fund of Knowledge:  Fair  Language:  Good  Akathisia:    Handed:    AIMS (if indicated):     Assets:  Desire for Improvement Housing Intimacy  ADL's:  Intact  Cognition:  WNL  Sleep:  Number of Hours: 5     Treatment Plan Summary: Patient is very distressed. She is is actively psychotic. She remains at risk to self. We have agreed to target her symptoms with Haldol.  Psychiatric: SUD Psychotic Disorder  Medical: HCV RA Liver disease  Psychosocial:   PLAN: 1. Haldol 5 mg BID 2. Discontinue Olanzapine 3. Continue suicide precautions 4. Would reevaluate after use of Haldol.   Artist [place], MD 01/20/2017, 4:47 PM

## 2017-01-20 NOTE — Tx Team (Signed)
Interdisciplinary Treatment and Diagnostic Plan Update  01/20/2017 Time of Session: 0930 Carla Park MRN: [id number]  Principal Diagnosis: Alcohol Use Disorder  Secondary Diagnoses: Principal Problem:   MDD (major depressive disorder), recurrent severe, without psychosis (HCC) Active Problems:   Chronic pain   Alcohol withdrawal (HCC)   Long-term current use of methadone for opiate dependence (HCC)   Current Medications:  Current Facility-Administered Medications  Medication Dose Route Frequency Provider Last Rate Last Dose  . alum & mag hydroxide-simeth (MAALOX/MYLANTA) 200-200-20 MG/5ML suspension 30 mL  30 mL Oral Q4H PRN [name], NP      . escitalopram (LEXAPRO) tablet 20 mg  20 mg Oral QHS Oneta Rack, NP   20 mg at 01/19/17 2107  . feeding supplement (ENSURE ENLIVE) (ENSURE ENLIVE) liquid 237 mL  237 mL Oral BID BM [name], MD   237 mL at 01/20/17 1000  . gabapentin (NEURONTIN) capsule 300 mg  300 mg Oral TID Oneta Rack, NP   300 mg at 01/20/17 0814  . hydrOXYzine (ATARAX/VISTARIL) tablet 25 mg  25 mg Oral Q6H PRN [name], NP   25 mg at 01/20/17 1033  . lactulose (CHRONULAC) 10 GM/15ML solution 10 g  10 g Oral TID Donell Sievert E, PA-C   10 g at 01/20/17 4315  . magnesium hydroxide (MILK OF MAGNESIA) suspension 30 mL  30 mL Oral Daily PRN [name], NP      . methadone (DOLOPHINE) tablet 160 mg  160 mg Oral Daily Armandina Stammer I, NP   160 mg at 01/20/17 0813  . methocarbamol (ROBAXIN) tablet 500 mg  500 mg Oral Q6H PRN [name], PA-C   500 mg at 01/20/17 0054  . multivitamin with minerals tablet 1 tablet  1 tablet Oral Daily [name], NP   1 tablet at 01/20/17 0813  . naproxen (NAPROSYN) tablet 500 mg  500 mg Oral BID [name], PA-C   500 mg at 01/20/17 4008  . nicotine (NICODERM CQ - dosed in mg/24 hours) patch 21 mg  21 mg Transdermal Daily [name], MD   21 mg at 01/20/17 0816  .  OLANZapine (ZYPREXA) tablet 5 mg  5 mg Oral QHS Donell Sievert E, PA-C   5 mg at 01/19/17 2107  . pantoprazole (PROTONIX) EC tablet 40 mg  40 mg Oral Daily [name], PA-C   40 mg at 01/20/17 0813  . thiamine (B-1) injection 100 mg  100 mg Intramuscular Once [name], NP      . thiamine (VITAMIN B-1) tablet 100 mg  100 mg Oral Daily [name], NP   100 mg at 01/20/17 6761  . topiramate (TOPAMAX) tablet 25 mg  25 mg Oral Daily [name], PA-C   25 mg at 01/20/17 0813  . traZODone (DESYREL) tablet 100 mg  100 mg Oral QHS Oneta Rack, NP   100 mg at 01/19/17 2107   PTA Medications: Prescriptions Prior to Admission  Medication Sig Dispense Refill Last Dose  . acetaminophen (TYLENOL) 325 MG tablet Take 650 mg by mouth every 6 (six) hours as needed for mild pain.   PRN at PRN  . amoxicillin-clavulanate (AUGMENTIN) 875-125 MG tablet Take 1 tablet by mouth every 12 (twelve) hours. (Patient not taking: Reported on 01/13/2017) 20 tablet 0 Not Taking at Unknown time  . methadone (DOLOPHINE) 10 MG/ML solution Take 160 mg by mouth daily.    01/12/2017 at  am  . Multiple Vitamin (MULTIVITAMIN WITH MINERALS) TABS tablet Take 1 tablet by mouth 3 (three) times daily. (Patient not taking: Reported on 01/13/2017) 30 tablet 0 Not Taking at Unknown time  . naproxen (NAPROSYN) 250 MG tablet Take 1 tablet (250 mg total) by mouth 2 (two) times daily with a meal. (Patient not taking: Reported on 01/13/2017) 30 tablet 0 Not Taking at Unknown time  . ondansetron (ZOFRAN ODT) 4 MG disintegrating tablet Take 1 tablet (4 mg total) by mouth every 8 (eight) hours as needed for nausea or vomiting. (Patient not taking: Reported on 01/13/2017) 10 tablet 0 Not Taking at Unknown time    Patient Stressors: Financial difficulties Health problems Legal issue Occupational concerns Substance abuse  Patient Strengths: Capable of independent living Wellsite geologist fund of  knowledge Supportive family/friends Work skills  Treatment Modalities: Medication Management, Group therapy, Case management,  1 to 1 session with clinician, Psychoeducation, Recreational therapy.   Physician Treatment Plan for Primary Diagnosis: Alcohol Use Disorder  Medication Management: Evaluate patient's response, side effects, and tolerance of medication regimen.  Therapeutic Interventions: 1 to 1 sessions, Unit Group sessions and Medication administration.  Evaluation of Outcomes: Progressing  Physician Treatment Plan for Secondary Diagnosis: Principal Problem:   MDD (major depressive disorder), recurrent severe, without psychosis (HCC) Active Problems:   Chronic pain   Alcohol withdrawal (HCC)   Long-term current use of methadone for opiate dependence (HCC)  Long Term Goal(s): Improvement in symptoms so as ready for discharge Improvement in symptoms so as ready for discharge   Short Term Goals: Ability to identify changes in lifestyle to reduce recurrence of condition will improve Ability to verbalize feelings will improve Ability to disclose and discuss suicidal ideas Ability to demonstrate self-control will improve Ability to identify and develop effective coping behaviors will improve Ability to maintain clinical measurements within normal limits will improve Ability to identify triggers associated with substance abuse/mental health issues will improve     Medication Management: Evaluate patient's response, side effects, and tolerance of medication regimen.  Therapeutic Interventions: 1 to 1 sessions, Unit Group sessions and Medication administration.  Evaluation of Outcomes: Progressing   RN Treatment Plan for Primary Diagnosis: Alcohol Use Disorder Long Term Goal(s): Knowledge of disease and therapeutic regimen to maintain health will improve  Short Term Goals: Ability to remain free from injury will improve, Ability to verbalize feelings will improve and  Ability to disclose and discuss suicidal ideas  Medication Management: RN will administer medications as ordered by provider, will assess and evaluate patient's response and provide education to patient for prescribed medication. RN will report any adverse and/or side effects to prescribing provider.  Therapeutic Interventions: 1 on 1 counseling sessions, Psychoeducation, Medication administration, Evaluate responses to treatment, Monitor vital signs and CBGs as ordered, Perform/monitor CIWA, COWS, AIMS and Fall Risk screenings as ordered, Perform wound care treatments as ordered.  Evaluation of Outcomes: Progressing   LCSW Treatment Plan for Primary Diagnosis: Alcohol Use Disorder Long Term Goal(s): Safe transition to appropriate next level of care at discharge, Engage patient in therapeutic group addressing interpersonal concerns.  Short Term Goals: Engage patient in aftercare planning with referrals and resources, Facilitate patient progression through stages of change regarding substance use diagnoses and concerns and Identify triggers associated with mental health/substance abuse issues  Therapeutic Interventions: Assess for all discharge needs, 1 to 1 time with Social worker, Explore available resources and support systems, Assess for adequacy in community support network, Educate family and significant other(s)  on suicide prevention, Complete Psychosocial Assessment, Interpersonal group therapy.  Evaluation of Outcomes: Progressing   Progress in Treatment: Attending groups: sometimes  Participating in groups: minimally, when she attends Taking medication as prescribed: Yes. Toleration medication: Yes. Family/Significant other contact made: SPE completed with pt's husband.  Patient understands diagnosis: Yes. Discussing patient identified problems/goals with staff: Yes. Medical problems stabilized or resolved: Yes. Denies suicidal/homicidal ideation: Yes. Issues/concerns per  patient self-inventory: No. Other: n/a  New problem(s) identified: No, Describe:  n/a  New Short Term/Long Term Goal(s): detox; elimination of SI thoughts; medication stabilization; development of comprehensive mental wellness/sobriety plan.   Discharge Plan or Barriers: Pt declined at [facility] and [facility]. Pt plans to return home; resume services with current methadone provdier. CSW assessing additional resources that may be helpful.   Reason for Continuation of Hospitalization: Anxiety Depression Medication stabilization Withdrawal symptoms/AH  Estimated Length of Stay: 2-3 days   Attendees: Patient: 01/20/2017 11:00 AM  Physician: Dr. Jackquline Berlin MD 01/20/2017 11:00 AM  Nursing: Yehuda Budd RN 01/20/2017 11:00 AM  RN Care Manager: Onnie Boer CM 01/20/2017 11:00 AM  Social Worker: Trula Slade, LCSW 01/20/2017 11:00 AM  Recreational Therapist: x 01/20/2017 11:00 AM  Other: Armandina Stammer NP 01/20/2017 11:00 AM  Other:  01/20/2017 11:00 AM  Other: 01/20/2017 11:00 AM    Scribe for Treatment Team: Ledell Peoples Smart, LCSW 01/20/2017 11:00 AM

## 2017-01-20 NOTE — [facility] Group Notes (Signed)
[facility] LCSW Group Therapy  01/20/2017 3:20 PM  Type of Therapy:  Group Therapy  Participation Level:  Did Not Attend-pt invited. Chose to remain in bed.   Summary of Progress/Problems: MHA Speaker came to talk about his personal journey with substance abuse and addiction. The pt processed ways by which to relate to the speaker. MHA speaker provided handouts and educational information pertaining to groups and services offered by the [facility].   Nikkia Devoss N Smart LCSW 01/20/2017, 3:20 PM

## 2017-01-21 LAB — COMPREHENSIVE METABOLIC PANEL
ALT: 281 U/L — ABNORMAL HIGH (ref 14–54)
AST: 175 U/L — ABNORMAL HIGH (ref 15–41)
Albumin: 3.7 g/dL (ref 3.5–5.0)
Alkaline Phosphatase: 127 U/L — ABNORMAL HIGH (ref 38–126)
Anion gap: 9 (ref 5–15)
BUN: 14 mg/dL (ref 6–20)
CO2: 27 mmol/L (ref 22–32)
Calcium: 9.2 mg/dL (ref 8.9–10.3)
Chloride: 105 mmol/L (ref 101–111)
Creatinine, Ser: 0.97 mg/dL (ref 0.44–1.00)
GFR calc Af Amer: >60 mL/min (ref >60)
GFR calc non Af Amer: >60 mL/min (ref >60)
Glucose, Bld: 95 mg/dL (ref 65–99)
Potassium: 4.4 mmol/L (ref 3.5–5.1)
Sodium: 141 mmol/L (ref 135–145)
Total Bilirubin: 0.2 mg/dL — ABNORMAL LOW (ref 0.3–1.2)
Total Protein: 6.7 g/dL (ref 6.5–8.1)

## 2017-01-21 NOTE — Progress Notes (Signed)
[facility] MD Progress Note  01/21/2017 1:51 PM Carla Park  MRN:  [id number] Subjective:   36 yo Caucasian female, married, lives with her family. 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 Voluntary admission on account of suicidal thoughts. Expressed plans to cut self with a razor. Has been very distressed lately. Recent loss of privileges at the Methadone clinic and DUI as her main stressors. BAL was 149 mg/dl at presentation. UDS was negative. Lipase is elevated but trending down. Elevated lAST, ALT and serum alkaline phosphatase.   Chart reviewed today. Patient discussed at team  Staff reports that she is less distressed. She has not complained of hallucinations today. She is interacting more with peers. She is now participating at groups.  Seen today. Was at groups just before interview. Says the hallucinations are less. She can barely make it out anymore. No longer distressed by them. She is able to focus better. Says her family came to visit. Feels they are heaping a lot of blame on her. Upset with herself for letting them down again. Says she hopes to fight on and stay sober. Patient states that she feels tired most of the time. I reviewed her labs and medications with her. Correlation between alcohol use and recent biochemical changes were discussed. Likelihood  of acute pancreatitis and chronic liver disease if she continues to use was explored. Patient notes that her father had pancreatitis from drinking. We have agreed to wean her off Topiramate as it might be causing somnolence.   Principal Problem: MDD (major depressive disorder), recurrent severe, without psychosis ([place]) Diagnosis:   Patient Active Problem List   Diagnosis Date Noted  . MDD (major depressive disorder), recurrent severe, without psychosis ([place]) [F33.2] 01/14/2017  . Long-term current use of methadone for opiate dependence ([facility]) [F11.20] 06/18/2015  . Alcohol withdrawal ([place]) [F10.239] 05/29/2015  . Chronic pain  [G89.29]   . Fibromyalgia [M79.7]   . 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 Migraines [G43.909] 11/18/2012  . RA (rheumatoid arthritis) ([place]) [M06.9] 11/18/2012   Total Time spent with patient: 20 minutes  Past Psychiatric History: As in H&P  Past Medical History:  Past Medical History:  Diagnosis Date  . Arthritis   . Chronic pain   . Fibromyalgia   . Hep C w/ coma, chronic (HCC)     Past Surgical History:  Procedure Laterality Date  . ABDOMINAL HYSTERECTOMY    . APPENDECTOMY    . CESAREAN SECTION    . CHOLECYSTECTOMY    . KNEE SURGERY     Family History:  Family History  Problem Relation Age of Onset  . Multiple sclerosis Mother    Family Psychiatric  History: As in H&P Social History:  History  Alcohol Use  . Yes    Comment: 2-500 ml boxes daily at least     History  Drug Use No    Social History   Social History  . Marital status: Single    Spouse name: N/A  . Number of children: N/A  . Years of education: N/A   Social History Main Topics  . Smoking status: Current Every Day Smoker    Packs/day: 1.00    Types: Cigarettes  . Smokeless tobacco: Never Used  . Alcohol use Yes     Comment: 2-500 ml boxes daily at least  . Drug use: No  . Sexual activity: Yes    Birth control/ protection: Surgical   Other Topics Concern  . None   Social  History Narrative  . None   Additional Social History:    Pain Medications: see MAR Prescriptions: see MAR Over the Counter: see MAR History of alcohol / drug use?: Yes Negative Consequences of Use: Financial, Legal, Personal relationships, Work / School Withdrawal Symptoms: Diarrhea, Tachycardia, Sweats, Irritability, Tremors Name of Substance 1: alcohol 1 - Age of First Use: 11 or 12 1 - Amount (size/oz): 2 or 3 of the 500 ml box wines 1 - Frequency: daily 1 - Duration: the past year 1 - Last Use / Amount: 01/13/17 Name of Substance 2: opiates 2 - Age of First Use: 16 2 - Amount (size/oz): unknown 2  - Frequency: daily 2 - Duration: years 2 - Last Use / Amount: currently on methadone through clinic     Sleep: Good  Appetite:  Good  Current Medications: Current Facility-Administered Medications  Medication Dose Route Frequency Provider Last Rate Last Dose  . alum & mag hydroxide-simeth (MAALOX/MYLANTA) 200-200-20 MG/5ML suspension 30 mL  30 mL Oral Q4H PRN Ethelene Hal, NP      . escitalopram (LEXAPRO) tablet 20 mg  20 mg Oral QHS Derrill Center, NP   20 mg at 01/20/17 2122  . feeding supplement (ENSURE ENLIVE) (ENSURE ENLIVE) liquid 237 mL  237 mL Oral BID BM Hampton Abbot, MD   237 mL at 01/21/17 1312  . gabapentin (NEURONTIN) capsule 300 mg  300 mg Oral TID Derrill Center, NP   300 mg at 01/21/17 1311  . haloperidol (HALDOL) tablet 5 mg  5 mg Oral BID Izediuno, Laruth Bouchard, MD   5 mg at 01/21/17 0831  . hydrOXYzine (ATARAX/VISTARIL) tablet 25 mg  25 mg Oral Q6H PRN Ethelene Hal, NP   25 mg at 01/21/17 1312  . lactulose (CHRONULAC) 10 GM/15ML solution 10 g  10 g Oral TID Patriciaann Clan E, PA-C   10 g at 01/21/17 1310  . magnesium hydroxide (MILK OF MAGNESIA) suspension 30 mL  30 mL Oral Daily PRN Ethelene Hal, NP      . methadone (DOLOPHINE) tablet 160 mg  160 mg Oral Daily Lindell Spar I, NP   160 mg at 01/21/17 0830  . methocarbamol (ROBAXIN) tablet 500 mg  500 mg Oral Q6H PRN Laverle Hobby, PA-C   500 mg at 01/20/17 1716  . multivitamin with minerals tablet 1 tablet  1 tablet Oral Daily Ethelene Hal, NP   1 tablet at 01/21/17 0831  . naproxen (NAPROSYN) tablet 500 mg  500 mg Oral BID WC Patriciaann Clan E, PA-C   500 mg at 01/21/17 0831  . nicotine (NICODERM CQ - dosed in mg/24 hours) patch 21 mg  21 mg Transdermal Daily Hampton Abbot, MD   21 mg at 01/21/17 0831  . pantoprazole (PROTONIX) EC tablet 40 mg  40 mg Oral Daily Patriciaann Clan E, PA-C   40 mg at 01/21/17 0830  . thiamine (B-1) injection 100 mg  100 mg Intramuscular Once Ethelene Hal, NP      . thiamine (VITAMIN B-1) tablet 100 mg  100 mg Oral Daily Ethelene Hal, NP   100 mg at 01/21/17 0831  . topiramate (TOPAMAX) tablet 25 mg  25 mg Oral Daily Laverle Hobby, PA-C   25 mg at 01/21/17 0831  . traZODone (DESYREL) tablet 100 mg  100 mg Oral QHS Derrill Center, NP   100 mg at 01/20/17 2122    Lab Results: No results found for this  or any previous visit (from the past 48 hour(s)).  Blood Alcohol level:  Lab Results  Component Value Date   ETH 149 (H) 01/13/2017   ETH <5 76/16/0737    Metabolic Disorder Labs: No results found for: HGBA1C, MPG No results found for: PROLACTIN No results found for: CHOL, TRIG, HDL, CHOLHDL, VLDL, LDLCALC  Physical Findings: AIMS: Facial and Oral Movements Muscles of Facial Expression: None, normal Lips and Perioral Area: None, normal Jaw: None, normal Tongue: None, normal,Extremity Movements Upper (arms, wrists, hands, fingers): None, normal Lower (legs, knees, ankles, toes): None, normal, Trunk Movements Neck, shoulders, hips: None, normal, Overall Severity Severity of abnormal movements (highest score from questions above): None, normal Incapacitation due to abnormal movements: None, normal Patient's awareness of abnormal movements (rate only patient's report): No Awareness, Dental Status Current problems with teeth and/or dentures?: No Does patient usually wear dentures?: No  CIWA:  CIWA-Ar Total: 8 COWS:  COWS Total Score: 6  Musculoskeletal: Strength & Muscle Tone: within normal limits Gait & Station: normal Patient leans: N/A  Psychiatric Specialty Exam: Physical Exam  Constitutional: She is oriented to person, place, and time. She appears well-developed and well-nourished.  HENT:  Head: Normocephalic and atraumatic.  Eyes: Conjunctivae are normal. Pupils are equal, round, and reactive to light.  Neck: Normal range of motion. Neck supple.  Cardiovascular: Normal rate and regular rhythm.    Respiratory: Effort normal and breath sounds normal.  GI: Soft.  Musculoskeletal: Normal range of motion.  Neurological: She is alert and oriented to person, place, and time.  Skin: Skin is warm and dry.  Psychiatric:  As above    ROS  Blood pressure 121/75, pulse 66, temperature 97.7 F (36.5 C), temperature source Oral, resp. rate 16, height _0  (1.549 m), weight 52.2 kg (115 lb).Body mass index is 21.73 kg/m.  General Appearance: Much calmer. Teared up while talking about her family. Good rapport. Appropriate and does not appear internally distracted.   Eye Contact:  Good  Speech:  Normal Rate  Volume:  Decreased  Mood:  Feels better  Affect:  Blunted and mood congruent.   Thought Process:  Linear  Orientation:  Full (Time, Place, and Person)  Thought Content:  Hallucinations are less. No thoughts of violence. No delusional theme.   Suicidal Thoughts:  Less  Homicidal Thoughts:  No  Memory:  Did not  assess at this time.   Judgement:  Fair  Insight:  Good  Psychomotor Activity:  Decreased  Concentration:  Impaired   Recall:  Unable to assess at this time.   Fund of Knowledge:  Fair  Language:  Good  Akathisia:    Handed:    AIMS (if indicated):     Assets:  Desire for Improvement Housing Intimacy  ADL's:  Intact  Cognition:  WNL  Sleep:  Number of Hours: 6.5     Treatment Plan Summary: Patient is less distressed today. Psychosis is responding to treatment. She is beginning to process effects of substances on her physical and mental health. She needs further evaluation.   Psychiatric: SUD Psychotic Disorder  Medical: HCV RA Liver disease  Psychosocial:   PLAN: 1. DiscontinueTopiramate 2. Serial CMP 3. Continue to monitor mood, behavior and interaction with peers   Artist Beach, MD 01/21/2017, 1:51 PMPatient ID: Carla Park, female   DOB: 1981/06/24, 36 y.o.   MRN: [id number]

## 2017-01-21 NOTE — Progress Notes (Signed)
  Adult Psychoeducational Group Note  Date:  01/21/2017 Time:  5:50 AM  Group Topic/Focus:  Wrap-Up Group:   The focus of this group is to help patients review their daily goal of treatment and discuss progress on daily workbooks.  Participation Level:  Active  Participation Quality:  Appropriate, Attentive, Sharing and Supportive  Affect:  Appropriate  Cognitive:  Appropriate  Insight: Appropriate and Good  Engagement in Group:  Developing/Improving  Modes of Intervention:  Discussion, Socialization and Support  Additional Comments:  Pt shared she had a better day and that her visit with her husband and son went well. Pt also shared she would be attending IOP when she discharges.  Karleen Hampshire Brittini 01/21/2017, 5:50 AM

## 2017-01-21 NOTE — Progress Notes (Signed)
Recreation Therapy Notes  Date: 01/21/17 Time: 0930 Location: [address] Dayroom  Group Topic: Stress Management  Goal Area(s) Addresses:  Patient will verbalize importance of using healthy stress management.  Patient will identify positive emotions associated with healthy stress management.   Intervention: Stress Management  Activity :  Progressive Muscle Relaxation.  LRT introduced the stress management technique of progressive muscle relaxation.  LRT read a script to allow patients to participate in PMR which allowed them to tense and relax each muscle group individually.  Patients were to follow along with the script to fully engage in the activity.  Education:  Stress Management, Discharge Planning.   Education Outcome: Acknowledges edcuation/In group clarification offered/Needs additional education  Clinical Observations/Feedback: Pt did not attend group.   Caroll Rancher, LRT/CTRS         Caroll Rancher A 01/21/2017 11:42 AM

## 2017-01-21 NOTE — Progress Notes (Signed)
DAR NOTE: Patient presents with anxious affect and depressed mood. Pt has been isolating most of the time in the room. Pt has presented with shakes, appeared drowsy most of the day. Pt stated she is better today than yesterday. Denies pain, auditory and visual hallucinations.  Rates depression at 4, hopelessness at 2, and anxiety at 4.  Maintained on routine safety checks.  Medications given as prescribed.  Support and encouragement offered as needed.  Attended group and participated. Patient observed socializing with peers in the dayroom.  Offered no complaint.

## 2017-01-21 NOTE — Progress Notes (Signed)
D: Pt at the time of assessment was flat, isolative and withdrawn to her room. Pt endorsed moderate anxiety, depression, pain and VAH-denies any improvement since she got admitted. Pt denied SI or HI.  A: Medications offered as prescribed. All patient's questions and concerns addressed. Support, encouragement, and safe environment provided. Will continue to monitor for any changes. 15-minute safety checks continue. R: Pt was med compliant.  Pt attended wrap-up group. Safety checks continue.

## 2017-01-21 NOTE — [facility] Group Notes (Signed)
[facility] LCSW Group Therapy  01/21/2017 3:22 PM  Type of Therapy:  Group Therapy  Participation Level:  Did Not Attend-pt invited. Chose to remain in bed.   Summary of Progress/Problems: Emotion Regulation: This group focused on both positive and negative emotion identification and allowed group members to process ways to identify feelings, regulate negative emotions, and find healthy ways to manage internal/external emotions. Group members were asked to reflect on a time when their reaction to an emotion led to a negative outcome and explored how alternative responses using emotion regulation would have benefited them. Group members were also asked to discuss a time when emotion regulation was utilized when a negative emotion was experienced.   Estefanie Cornforth N Smart LCSW 01/21/2017, 3:22 PM

## 2017-01-22 MED ORDER — BISACODYL 10 MG RE SUPP
10.0000 mg | Freq: Every day | RECTAL | Status: DC | PRN
  Administered 2017-01-22: 10 mg via RECTAL
  Filled 2017-01-22: qty 1

## 2017-01-22 NOTE — [facility] Group Notes (Signed)
[facility] LCSW Group Therapy  01/22/2017 3:57 PM  Type of Therapy:  Group Therapy  Participation Level:  Active  Participation Quality:  Attentive  Affect:  Appropriate  Cognitive:  Alert and Oriented  Insight:  Improving  Engagement in Therapy:  Improving  Modes of Intervention:  Confrontation, Discussion, Education, Socialization and Support  Summary of Progress/Problems: Today's Topic: Overcoming Obstacles. Patients identified one short term goal and potential obstacles in reaching this goal. Patients processed barriers involved in overcoming these obstacles. Patients identified steps necessary for overcoming these obstacles and explored motivation (internal and external) for facing these difficulties head on. Carla Park was attentive and engaged during today's processing group. She shared that her biggest obstacle is remaining sober after discharging. "My husband thinks I'm gonna relapse if I go home without going inpatient but I can't get in anywhere." Carla Park was receptive to working with CSW to discussing intensive outpatient options.   Carla Park Smart LCSW 01/22/2017, 3:57 PM

## 2017-01-22 NOTE — Progress Notes (Signed)
At the beginning of the shift, pt came to the nurse's station stating that she was feeling very anxious since her husband and daughter had left from visitation.  She was visibly shaking.  She was also upset that her husband does not want her to come home until she has gone through a treatment program.  Pt says she just wants to go home after detox.  Pt denies SI/HI/AVH.  She was given Vistaril 25 mg for her anxiety and also Robaxin 500 mg for her generalized pain r/t her withdrawal symptoms.  Pt went to her room and lay down.  She went to sleep and had to be wakened to take her hs meds.  Support and encouragement offered.  Discharge plans are in process.  Pt was encouraged to speak with the CSW about possible having a family meeting arranged with her husband.  Safety maintained with q15 minute checks.

## 2017-01-22 NOTE — Progress Notes (Signed)
[facility] MD Progress Note  01/22/2017 4:12 PM Lavetta Geier  MRN:  [id number] Subjective:   36 yo Caucasian female, married, lives with her family. 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 Voluntary admission on account of suicidal thoughts. Expressed plans to cut self with a razor. Has been very distressed lately. Recent loss of privileges at the Methadone clinic and DUI as her main stressors. BAL was 149 mg/dl at presentation. UDS was negative. Lipase is elevated but trending down. Elevated lAST, ALT and serum alkaline phosphatase.   Chart reviewed today. Patient discussed at team  Staff reports that she has been participating at groups. She was distressed after her family came to visit yesterday. Her husband wants her to get into long term inpatient rehab. Patient would prefer to go back home and do outpatient. She attends unit groups and participates. No behavioral issues. She has not voiced any thoughts of suicide. She has not been observed to be internally distracted. She interacts normally without any thought block. No reported or observable side efects  Seen today. Out at the gymn with peers. Enjoying a game of cards. Says she feels better. Feels more alert today. No hallucinations today. No suicidal thoughts. Ambivalence about addiction treatment that her family is pushing for. Patient encouraged.  Principal Problem: MDD (major depressive disorder), recurrent severe, without psychosis ([place]) Diagnosis:   Patient Active Problem List   Diagnosis Date Noted  . MDD (major depressive disorder), recurrent severe, without psychosis ([place]) [F33.2] 01/14/2017  . Long-term current use of methadone for opiate dependence ([place]) [F11.20] 06/18/2015  . Alcohol withdrawal ([place]) [F10.239] 05/29/2015  . Chronic pain [G89.29]   . Fibromyalgia [M79.7]   . Hep C w/ coma, chronic ([place]) [B18.2]   . Migraines [G43.909] 11/18/2012  . RA (rheumatoid arthritis) ([place]) [M06.9] 11/18/2012   Total Time spent  with patient: 20 minutes  Past Psychiatric History: As in H&P  Past Medical History:  Past Medical History:  Diagnosis Date  . Arthritis   . Chronic pain   . Fibromyalgia   . Hep C w/ coma, chronic (HCC)     Past Surgical History:  Procedure Laterality Date  . ABDOMINAL HYSTERECTOMY    . APPENDECTOMY    . CESAREAN SECTION    . CHOLECYSTECTOMY    . KNEE SURGERY     Family History:  Family History  Problem Relation Age of Onset  . Multiple sclerosis Mother    Family Psychiatric  History: As in H&P Social History:  History  Alcohol Use  . Yes    Comment: 2-500 ml boxes daily at least     History  Drug Use No    Social History   Social History  . Marital status: Single    Spouse name: N/A  . Number of children: N/A  . Years of education: N/A   Social History Main Topics  . Smoking status: Current Every Day Smoker    Packs/day: 1.00    Types: Cigarettes  . Smokeless tobacco: Never Used  . Alcohol use Yes     Comment: 2-500 ml boxes daily at least  . Drug use: No  . Sexual activity: Yes    Birth control/ protection: Surgical   Other Topics Concern  . None   Social History Narrative  . None   Additional Social History:    Pain Medications: see MAR Prescriptions: see MAR Over the Counter: see MAR History of alcohol / drug use?: Yes Negative Consequences of Use: Financial, Legal, Personal relationships,  Work / Youth worker Withdrawal Symptoms: Diarrhea, Tachycardia, Sweats, Irritability, Tremors Name of Substance 1: alcohol 1 - Age of First Use: 11 or 12 1 - Amount (size/oz): 2 or 3 of the 500 ml box wines 1 - Frequency: daily 1 - Duration: the past year 1 - Last Use / Amount: 01/13/17 Name of Substance 2: opiates 2 - Age of First Use: 16 2 - Amount (size/oz): unknown 2 - Frequency: daily 2 - Duration: years 2 - Last Use / Amount: currently on methadone through clinic     Sleep: Good  Appetite:  Good  Current Medications: Current  Facility-Administered Medications  Medication Dose Route Frequency Provider Last Rate Last Dose  . alum & mag hydroxide-simeth (MAALOX/MYLANTA) 200-200-20 MG/5ML suspension 30 mL  30 mL Oral Q4H PRN Ethelene Hal, NP      . bisacodyl (DULCOLAX) suppository 10 mg  10 mg Rectal Daily PRN Artist Beach, MD   10 mg at 01/22/17 1212  . escitalopram (LEXAPRO) tablet 20 mg  20 mg Oral QHS Derrill Center, NP   20 mg at 01/21/17 2329  . feeding supplement (ENSURE ENLIVE) (ENSURE ENLIVE) liquid 237 mL  237 mL Oral BID BM Hampton Abbot, MD   237 mL at 01/22/17 0747  . gabapentin (NEURONTIN) capsule 300 mg  300 mg Oral TID Derrill Center, NP   300 mg at 01/22/17 1211  . haloperidol (HALDOL) tablet 5 mg  5 mg Oral BID Izediuno, Laruth Bouchard, MD   5 mg at 01/22/17 0749  . hydrOXYzine (ATARAX/VISTARIL) tablet 25 mg  25 mg Oral Q6H PRN Ethelene Hal, NP   25 mg at 01/21/17 2002  . lactulose (CHRONULAC) 10 GM/15ML solution 10 g  10 g Oral TID Patriciaann Clan E, PA-C   10 g at 01/22/17 1211  . magnesium hydroxide (MILK OF MAGNESIA) suspension 30 mL  30 mL Oral Daily PRN Ethelene Hal, NP      . methadone (DOLOPHINE) tablet 160 mg  160 mg Oral Daily Lindell Spar I, NP   160 mg at 01/22/17 0748  . methocarbamol (ROBAXIN) tablet 500 mg  500 mg Oral Q6H PRN Laverle Hobby, PA-C   500 mg at 01/21/17 2001  . multivitamin with minerals tablet 1 tablet  1 tablet Oral Daily Ethelene Hal, NP   1 tablet at 01/22/17 0749  . naproxen (NAPROSYN) tablet 500 mg  500 mg Oral BID WC Patriciaann Clan E, PA-C   500 mg at 01/22/17 0749  . nicotine (NICODERM CQ - dosed in mg/24 hours) patch 21 mg  21 mg Transdermal Daily Hampton Abbot, MD   21 mg at 01/22/17 0749  . pantoprazole (PROTONIX) EC tablet 40 mg  40 mg Oral Daily Patriciaann Clan E, PA-C   40 mg at 01/22/17 0749  . thiamine (VITAMIN B-1) tablet 100 mg  100 mg Oral Daily Ethelene Hal, NP   100 mg at 01/22/17 0749  . traZODone  (DESYREL) tablet 100 mg  100 mg Oral QHS Derrill Center, NP   100 mg at 01/21/17 2328    Lab Results:  Results for orders placed or performed during the hospital encounter of 01/14/17 (from the past 48 hour(s))  Comprehensive metabolic panel     Status: Abnormal   Collection Time: 01/21/17  6:16 PM  Result Value Ref Range   Sodium 141 135 - 145 mmol/L   Potassium 4.4 3.5 - 5.1 mmol/L   Chloride 105 101 - 111  mmol/L   CO2 27 22 - 32 mmol/L   Glucose, Bld 95 65 - 99 mg/dL   BUN 14 6 - 20 mg/dL   Creatinine, Ser 0.97 0.44 - 1.00 mg/dL   Calcium 9.2 8.9 - 10.3 mg/dL   Total Protein 6.7 6.5 - 8.1 g/dL   Albumin 3.7 3.5 - 5.0 g/dL   AST 175 (H) 15 - 41 U/L   ALT 281 (H) 14 - 54 U/L   Alkaline Phosphatase 127 (H) 38 - 126 U/L   Total Bilirubin 0.2 (L) 0.3 - 1.2 mg/dL   GFR calc non Af Amer >60 >60 mL/min   GFR calc Af Amer >60 >60 mL/min    Comment: (NOTE) The eGFR has been calculated using the CKD EPI equation. This calculation has not been validated in all clinical situations. eGFR's persistently <60 mL/min signify possible Chronic Kidney Disease.    Anion gap 9 5 - 15    Comment: Performed at [facility], [place] [address] [place], [place] [address]    Blood Alcohol level:  Lab Results  Component Value Date   ETH 149 (H) 01/13/2017   ETH <5 40/81/4481    Metabolic Disorder Labs: No results found for: HGBA1C, MPG No results found for: PROLACTIN No results found for: CHOL, TRIG, HDL, CHOLHDL, VLDL, LDLCALC  Physical Findings: AIMS: Facial and Oral Movements Muscles of Facial Expression: None, normal Lips and Perioral Area: None, normal Jaw: None, normal Tongue: None, normal,Extremity Movements Upper (arms, wrists, hands, fingers): None, normal Lower (legs, knees, ankles, toes): None, normal, Trunk Movements Neck, shoulders, hips: None, normal, Overall Severity Severity of abnormal movements (highest score from questions above): None,  normal Incapacitation due to abnormal movements: None, normal Patient's awareness of abnormal movements (rate only patient's report): No Awareness, Dental Status Current problems with teeth and/or dentures?: No Does patient usually wear dentures?: No  CIWA:  CIWA-Ar Total: 3 COWS:  COWS Total Score: 4  Musculoskeletal: Strength & Muscle Tone: within normal limits Gait & Station: normal Patient leans: N/A  Psychiatric Specialty Exam: Physical Exam  Constitutional: She is oriented to person, place, and time. She appears well-developed and well-nourished.  HENT:  Head: Normocephalic and atraumatic.  Eyes: Conjunctivae are normal. Pupils are equal, round, and reactive to light.  Neck: Normal range of motion. Neck supple.  Cardiovascular: Normal rate and regular rhythm.   Respiratory: Effort normal and breath sounds normal.  GI: Soft.  Musculoskeletal: Normal range of motion.  Neurological: She is alert and oriented to person, place, and time.  Skin: Skin is warm and dry.  Psychiatric:  As above    ROS  Blood pressure 112/67, pulse 66, temperature 98.6 F (37 C), temperature source Oral, resp. rate 16, height _0  (1.549 m), weight 52.2 kg (115 lb).Body mass index is 21.73 kg/m.  General Appearance: Calm and cooperative. Appropriate and does not appear internally distracted.   Eye Contact:  Good  Speech:  Normal Rate  Volume:  Normal  Mood:  Feels better  Affect:  Mobilizing some positive affect  Thought Process:  Linear  Orientation:  Full (Time, Place, and Person)  Thought Content: No delusional theme. No preoccupation with violent thoughts.  No hallucination in any modality.    Suicidal Thoughts:  None  Homicidal Thoughts:  No  Memory:  Did not  assess at this time.   Judgement:  Fair  Insight:  Partial   Psychomotor Activity:  Normal  Concentration:  Better  Recall:  Did  not assess at this time.   Fund of Knowledge:  Fair  Language:  Good  Akathisia:    Handed:     AIMS (if indicated):     Assets:  Desire for Improvement Housing Intimacy  ADL's:  Intact  Cognition:  WNL  Sleep:  Number of Hours: 6.75     Treatment Plan Summary: Patient is  responding to treatment. She is process ambivalent views about ways of treating her addiction. We would continue to motivate her towards sustainable change.   Psychiatric: SUD Psychotic Disorder  Medical: HCV RA Liver disease  Psychosocial:   PLAN: 1. Continue current regimen 2. Continue to monitor mood, behavior and interaction with peers 3. Hopeful discharge as soon as we finalize aftercare   Artist Beach, MD 01/22/2017, 4:12 PMPatient ID: Ann Held, female   DOB: 1981-02-08, 36 y.o.   MRN: [id number] Patient ID: Jaydalyn Demattia, female   DOB: February 03, 1981, 36 y.o.   MRN: [id number]

## 2017-01-22 NOTE — Progress Notes (Signed)
DAR NOTE: Pt present with flat affect and depressed mood in the unit. Pt has been observed in the dayroom interacting with peers. Pt complained of shainess, took all her meds as scheduled. As per self inventory, pt had a fair night sleep, fair appetite, normal energy, and poor concentration. Pt rate depression at 8, hopeless ness at 7, and anxiety at 8. Pt's safety ensured with 15 minute and environmental checks. Pt currently denies SI/HI and A/V hallucinations. Pt verbally agrees to seek staff if SI/HI or A/VH occurs and to consult with staff before acting on these thoughts. Will continue POC.

## 2017-01-23 LAB — COMPREHENSIVE METABOLIC PANEL
ALT: 278 U/L — ABNORMAL HIGH (ref 14–54)
AST: 172 U/L — ABNORMAL HIGH (ref 15–41)
Albumin: 3.7 g/dL (ref 3.5–5.0)
Alkaline Phosphatase: 129 U/L — ABNORMAL HIGH (ref 38–126)
Anion gap: 9 (ref 5–15)
BUN: 16 mg/dL (ref 6–20)
CO2: 29 mmol/L (ref 22–32)
Calcium: 9.3 mg/dL (ref 8.9–10.3)
Chloride: 102 mmol/L (ref 101–111)
Creatinine, Ser: 1 mg/dL (ref 0.44–1.00)
GFR calc Af Amer: >60 mL/min (ref >60)
GFR calc non Af Amer: >60 mL/min (ref >60)
Glucose, Bld: 82 mg/dL (ref 65–99)
Potassium: 4 mmol/L (ref 3.5–5.1)
Sodium: 140 mmol/L (ref 135–145)
Total Bilirubin: 0.5 mg/dL (ref 0.3–1.2)
Total Protein: 6.6 g/dL (ref 6.5–8.1)

## 2017-01-23 NOTE — Progress Notes (Signed)
Pt was in her room at the beginning of the shift when writer was doing assessment rounds.  Pt reports she is doing better, and was just resting.  Writer spoke to her about the evening's activity and encouraged her to get up and attend as she might enjoy it.  She did get up and go to [place].  She stayed up for a short while after group ended.  She denies SI/HI/AVH at this time.  She presents brighter and more alert this evening.  Pt voiced no needs or concerns and only received her scheduled meds tonight.  Support and encouragement offered.  Discharge plans are in process.  Pt will most likely go to long term treatment after discharge from [facility].  Safety maintained with q15 minute checks.

## 2017-01-23 NOTE — Progress Notes (Signed)
[facility] MD Progress Note  01/23/2017 4:10 PM Carla Park  MRN:  [id number] Subjective:   36 yo Caucasian female, married, lives with her family. 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 Voluntary admission on account of suicidal thoughts. Expressed plans to cut self with a razor. Has been very distressed lately. Recent loss of privileges at the Methadone clinic and DUI as her main stressors. BAL was 149 mg/dl at presentation. UDS was negative. Lipase is elevated but trending down. Elevated lAST, ALT and serum alkaline phosphatase.   Chart reviewed today. Patient discussed at team  Nursing staff reports that patient has more interactive. No behavioral issues. Patient has not voiced any suicidal thoughts. Patient has not been observed to be internally stimulated. Patient has been adherent with treatment recommendations. Patient has been tolerating their medication well.  SW is coordinating aftercare with patient and her family.   Seen today. Feels better. No hallucination since last review. Still a bit withdrawn. No suicidal or homicidal thoughts. Tolerates her medication well.   Principal Problem: MDD (major depressive disorder), recurrent severe, without psychosis ([place]) Diagnosis:   Patient Active Problem List   Diagnosis Date Noted  . MDD (major depressive disorder), recurrent severe, without psychosis ([place]) [F33.2] 01/14/2017  . Long-term current use of methadone for opiate dependence ([place]) [F11.20] 06/18/2015  . Alcohol withdrawal ([place]) [F10.239] 05/29/2015  . Chronic pain [G89.29]   . Fibromyalgia [M79.7]   . Hep C w/ coma, chronic ([place]) [B18.2]   . Migraines [G43.909] 11/18/2012  . RA (rheumatoid arthritis) ([place]) [M06.9] 11/18/2012   Total Time spent with patient: 20 minutes  Past Psychiatric History: As in H&P  Past Medical History:  Past Medical History:  Diagnosis Date  . Arthritis   . Chronic pain   . Fibromyalgia   . Hep C w/ coma, chronic (HCC)     Past Surgical  History:  Procedure Laterality Date  . ABDOMINAL HYSTERECTOMY    . APPENDECTOMY    . CESAREAN SECTION    . CHOLECYSTECTOMY    . KNEE SURGERY     Family History:  Family History  Problem Relation Age of Onset  . Multiple sclerosis Mother    Family Psychiatric  History: As in H&P Social History:  History  Alcohol Use  . Yes    Comment: 2-500 ml boxes daily at least     History  Drug Use No    Social History   Social History  . Marital status: Single    Spouse name: N/A  . Number of children: N/A  . Years of education: N/A   Social History Main Topics  . Smoking status: Current Every Day Smoker    Packs/day: 1.00    Types: Cigarettes  . Smokeless tobacco: Never Used  . Alcohol use Yes     Comment: 2-500 ml boxes daily at least  . Drug use: No  . Sexual activity: Yes    Birth control/ protection: Surgical   Other Topics Concern  . None   Social History Narrative  . None   Additional Social History:    Pain Medications: see MAR Prescriptions: see MAR Over the Counter: see MAR History of alcohol / drug use?: Yes Negative Consequences of Use: Financial, Legal, Personal relationships, Work / School Withdrawal Symptoms: Diarrhea, Tachycardia, Sweats, Irritability, Tremors Name of Substance 1: alcohol 1 - Age of First Use: 11 or 12 1 - Amount (size/oz): 2 or 3 of the 500 ml box wines 1 - Frequency: daily 1 -  Duration: the past year 1 - Last Use / Amount: 01/13/17 Name of Substance 2: opiates 2 - Age of First Use: 16 2 - Amount (size/oz): unknown 2 - Frequency: daily 2 - Duration: years 2 - Last Use / Amount: currently on methadone through clinic     Sleep: Good  Appetite:  Good  Current Medications: Current Facility-Administered Medications  Medication Dose Route Frequency Provider Last Rate Last Dose  . alum & mag hydroxide-simeth (MAALOX/MYLANTA) 200-200-20 MG/5ML suspension 30 mL  30 mL Oral Q4H PRN Ethelene Hal, NP      . bisacodyl  (DULCOLAX) suppository 10 mg  10 mg Rectal Daily PRN Artist Beach, MD   10 mg at 01/22/17 1212  . escitalopram (LEXAPRO) tablet 20 mg  20 mg Oral QHS Derrill Center, NP   20 mg at 01/22/17 2154  . feeding supplement (ENSURE ENLIVE) (ENSURE ENLIVE) liquid 237 mL  237 mL Oral BID BM Hampton Abbot, MD   237 mL at 01/22/17 0747  . gabapentin (NEURONTIN) capsule 300 mg  300 mg Oral TID Derrill Center, NP   300 mg at 01/23/17 1259  . haloperidol (HALDOL) tablet 5 mg  5 mg Oral BID Hubert Derstine, Laruth Bouchard, MD   5 mg at 01/23/17 0806  . hydrOXYzine (ATARAX/VISTARIL) tablet 25 mg  25 mg Oral Q6H PRN Ethelene Hal, NP   25 mg at 01/21/17 2002  . lactulose (CHRONULAC) 10 GM/15ML solution 10 g  10 g Oral TID Patriciaann Clan E, PA-C   10 g at 01/23/17 1259  . magnesium hydroxide (MILK OF MAGNESIA) suspension 30 mL  30 mL Oral Daily PRN Ethelene Hal, NP      . methadone (DOLOPHINE) tablet 160 mg  160 mg Oral Daily Lindell Spar I, NP   160 mg at 01/23/17 0806  . methocarbamol (ROBAXIN) tablet 500 mg  500 mg Oral Q6H PRN Laverle Hobby, PA-C   500 mg at 01/21/17 2001  . multivitamin with minerals tablet 1 tablet  1 tablet Oral Daily Ethelene Hal, NP   1 tablet at 01/23/17 0254  . naproxen (NAPROSYN) tablet 500 mg  500 mg Oral BID WC Patriciaann Clan E, PA-C   500 mg at 01/23/17 2706  . nicotine (NICODERM CQ - dosed in mg/24 hours) patch 21 mg  21 mg Transdermal Daily Hampton Abbot, MD   21 mg at 01/23/17 0807  . pantoprazole (PROTONIX) EC tablet 40 mg  40 mg Oral Daily Laverle Hobby, PA-C   40 mg at 01/23/17 2376  . thiamine (VITAMIN B-1) tablet 100 mg  100 mg Oral Daily Ethelene Hal, NP   100 mg at 01/23/17 2831  . traZODone (DESYREL) tablet 100 mg  100 mg Oral QHS Derrill Center, NP   100 mg at 01/22/17 2154    Lab Results:  Results for orders placed or performed during the hospital encounter of 01/14/17 (from the past 48 hour(s))  Comprehensive metabolic panel      Status: Abnormal   Collection Time: 01/21/17  6:16 PM  Result Value Ref Range   Sodium 141 135 - 145 mmol/L   Potassium 4.4 3.5 - 5.1 mmol/L   Chloride 105 101 - 111 mmol/L   CO2 27 22 - 32 mmol/L   Glucose, Bld 95 65 - 99 mg/dL   BUN 14 6 - 20 mg/dL   Creatinine, Ser 0.97 0.44 - 1.00 mg/dL   Calcium 9.2 8.9 - 10.3 mg/dL  Total Protein 6.7 6.5 - 8.1 g/dL   Albumin 3.7 3.5 - 5.0 g/dL   AST 175 (H) 15 - 41 U/L   ALT 281 (H) 14 - 54 U/L   Alkaline Phosphatase 127 (H) 38 - 126 U/L   Total Bilirubin 0.2 (L) 0.3 - 1.2 mg/dL   GFR calc non Af Amer >60 >60 mL/min   GFR calc Af Amer >60 >60 mL/min    Comment: (NOTE) The eGFR has been calculated using the CKD EPI equation. This calculation has not been validated in all clinical situations. eGFR's persistently <60 mL/min signify possible Chronic Kidney Disease.    Anion gap 9 5 - 15    Comment: Performed at [facility], [place] [address], [place], [place] [address]  Comprehensive metabolic panel     Status: Abnormal   Collection Time: 01/23/17  6:04 AM  Result Value Ref Range   Sodium 140 135 - 145 mmol/L   Potassium 4.0 3.5 - 5.1 mmol/L   Chloride 102 101 - 111 mmol/L   CO2 29 22 - 32 mmol/L   Glucose, Bld 82 65 - 99 mg/dL   BUN 16 6 - 20 mg/dL   Creatinine, Ser 1.00 0.44 - 1.00 mg/dL   Calcium 9.3 8.9 - 10.3 mg/dL   Total Protein 6.6 6.5 - 8.1 g/dL   Albumin 3.7 3.5 - 5.0 g/dL   AST 172 (H) 15 - 41 U/L   ALT 278 (H) 14 - 54 U/L   Alkaline Phosphatase 129 (H) 38 - 126 U/L   Total Bilirubin 0.5 0.3 - 1.2 mg/dL   GFR calc non Af Amer >60 >60 mL/min   GFR calc Af Amer >60 >60 mL/min    Comment: (NOTE) The eGFR has been calculated using the CKD EPI equation. This calculation has not been validated in all clinical situations. eGFR's persistently <60 mL/min signify possible Chronic Kidney Disease.    Anion gap 9 5 - 15    Comment: Performed at [facility], [place] [address], [place], [place]  [address]    Blood Alcohol level:  Lab Results  Component Value Date   ETH 149 (H) 01/13/2017   ETH <5 21/97/5883    Metabolic Disorder Labs: No results found for: HGBA1C, MPG No results found for: PROLACTIN No results found for: CHOL, TRIG, HDL, CHOLHDL, VLDL, LDLCALC  Physical Findings: AIMS: Facial and Oral Movements Muscles of Facial Expression: None, normal Lips and Perioral Area: None, normal Jaw: None, normal Tongue: None, normal,Extremity Movements Upper (arms, wrists, hands, fingers): None, normal Lower (legs, knees, ankles, toes): None, normal, Trunk Movements Neck, shoulders, hips: None, normal, Overall Severity Severity of abnormal movements (highest score from questions above): None, normal Incapacitation due to abnormal movements: None, normal Patient's awareness of abnormal movements (rate only patient's report): No Awareness, Dental Status Current problems with teeth and/or dentures?: No Does patient usually wear dentures?: No  CIWA:  CIWA-Ar Total: 3 COWS:  COWS Total Score: 4  Musculoskeletal: Strength & Muscle Tone: within normal limits Gait & Station: normal Patient leans: N/A  Psychiatric Specialty Exam: Physical Exam  Constitutional: She is oriented to person, place, and time. She appears well-developed and well-nourished.  HENT:  Head: Normocephalic and atraumatic.  Eyes: Conjunctivae are normal. Pupils are equal, round, and reactive to light.  Neck: Normal range of motion. Neck supple.  Cardiovascular: Normal rate and regular rhythm.   Respiratory: Effort normal and breath sounds normal.  GI: Soft.  Musculoskeletal: Normal range  of motion.  Neurological: She is alert and oriented to person, place, and time.  Skin: Skin is warm and dry.  Psychiatric:  As above    ROS  Blood pressure 117/73, pulse 68, temperature 98.4 F (36.9 C), temperature source Oral, resp. rate 16, height _0  (1.549 m), weight 52.2 kg (115 lb).Body mass index is 21.73  kg/m.  General Appearance: Calm and cooperative. Appropriate and does not appear internally distracted.   Eye Contact:  Good  Speech:  Normal Rate  Volume:  Normal  Mood:  Improving   Affect:  Mobilizing some positive affect  Thought Process:  Linear  Orientation:  Full (Time, Place, and Person)  Thought Content: No delusional theme. No preoccupation with violent thoughts.  No hallucination in any modality.    Suicidal Thoughts:  None  Homicidal Thoughts:  No  Memory:  Did not  assess at this time.   Judgement:  Better  Insight:  Partial   Psychomotor Activity:  Normal  Concentration:  Better  Recall:  Did not assess at this time.   Fund of Knowledge:  Fair  Language:  Good  Akathisia:    Handed:    AIMS (if indicated):     Assets:  Desire for Improvement Housing Intimacy  ADL's:  Intact  Cognition:  WNL  Sleep:  Number of Hours: 6.75     Treatment Plan Summary: Patient is  responding to treatment. She has agreed to so outpatient addiction treatment. She is still frail and vulnerable. Hopeful discharge early next week.   Psychiatric: SUD Psychotic Disorder  Medical: HCV RA Liver disease  Psychosocial:   PLAN: 1. Continue current regimen 2. Continue to monitor mood, behavior and interaction with peers 3. Hopeful discharge early next week.    Artist Beach, MD 01/23/2017, 4:10 PMPatient ID: Carla Park, female   DOB: Nov 17, 1980, 36 y.o.   MRN: [id number] Patient ID: Carla Park, female   DOB: [birth year], 36 y.o.   MRN: [id number] Patient ID: Carla Park, female   DOB: Jan 14, 1981, 36 y.o.   MRN: [id number]

## 2017-01-23 NOTE — Progress Notes (Signed)
Patient attended AA group meeting.  

## 2017-01-23 NOTE — [facility] Group Notes (Signed)
[facility] LCSW Group Therapy  01/23/2017 3:47 PM  Type of Therapy:  Group Therapy  Participation Level:  Active  Participation Quality:  Attentive  Affect:  Appropriate  Cognitive:  Alert and Oriented  Insight:  Engaged  Engagement in Therapy:  Engaged  Modes of Intervention:  Confrontation, Discussion, Education, Socialization and Support  Summary of Progress/Problems: Feelings around Relapse. Group members discussed the meaning of relapse and shared personal stories of relapse, how it affected them and others, and how they perceived themselves during this time. Group members were encouraged to identify triggers, warning signs and coping skills used when facing the possibility of relapse. Social supports were discussed and explored in detail. Post Acute Withdrawal Syndrome (handout provided) was introduced and examined. Pt's were encouraged to ask questions, talk about key points associated with PAWS, and process this information in terms of relapse prevention.   Carla Katayama N Smart LCSW 01/23/2017, 3:47 PM

## 2017-01-23 NOTE — Progress Notes (Signed)
Patient ID: Carla Park, female   DOB: 08/20/1980, 36 y.o.   MRN: [id number]  Pt currently presents with a blunted affect and restless behavior. Some visible trembling of the patient noted, pt reports it started when she was admitted. Pt reports to writer that their goal is to "have a good day." Pt states "it was, I went outside and played cards." Pt reports good sleep with current medication regimen. Endorses on going anxiety.   Pt provided with medications per providers orders. Pt's labs and vitals were monitored throughout the night. Pt given a 1:1 about emotional and mental status. Pt supported and encouraged to express concerns and questions. Pt educated on medications.  Pt's safety ensured with 15 minute and environmental checks. Pt currently denies SI/HI. Endorses A/V hallucinations but states "they are happening as much now." Pt verbally agrees to seek staff if SI/HI occurs or A/VH worsens and to consult with staff before acting on any harmful thoughts. Will continue POC.

## 2017-01-23 NOTE — Progress Notes (Signed)
Recreation Therapy Notes  Date: 01/23/17 Time: 0930 Location: [address] Dayroom  Group Topic: Stress Management  Goal Area(s) Addresses:  Patient will verbalize importance of using healthy stress management.  Patient will identify positive emotions associated with healthy stress management.   Intervention: Stress Management  Activity :  Guided Imagery.  LRT introduced the stress management technique of guided imagery.  LRT read a script to allow patients to take a mental vacation to escape their current surroundings.  Patients were to follow along as the script was read to fully engage in the activity.  Education:  Stress Management, Discharge Planning.   Education Outcome: Acknowledges edcuation/In group clarification offered/Needs additional education  Clinical Observations/Feedback: Pt did not attend group.   Caroll Rancher, LRT/CTRS         Lillia Abed, Astria Jordahl A 01/23/2017 11:20 AM

## 2017-01-23 NOTE — Progress Notes (Signed)
D: Patient denies SI/HI or AVH. Patient has a depressed mood and flat affect.  Pt. States that her withdrawal symptoms have improved overall, states she still experiences tremors and anxiety often.  Pt. States that she is working on discharge planning for a long term treatment facility and feels okay about that but understands the difficulty of placement with regards to her methadone use.  Pt. Has been up on the unit interacting with staff and others, she is otherwise pleasant and cooperative.  A: Patient given emotional support from RN. Patient encouraged to come to staff with concerns and/or questions. Patient's medication routine continued. Patient's orders and plan of care reviewed.   R: Patient remains appropriate and cooperative. Will continue to monitor patient q15 minutes for safety.

## 2017-01-24 NOTE — [facility] Group Notes (Signed)
[facility] Group Notes: (Clinical Social Work)   01/24/2017      Type of Therapy:  Group Therapy   Participation Level:  Did Not Attend despite MHT prompting   Ambrose Mantle, LCSW 01/24/2017, 5:02 PM

## 2017-01-24 NOTE — [facility] Group Notes (Signed)
[facility] Group Notes:  (Nursing/MHT/Case Management/Adjunct)  Date:  01/24/2017  Time:  1315  Type of Therapy:  Nurse Education  Participation Level:  Active  Participation Quality:  Attentive  Affect:  Anxious  Cognitive:  Appropriate  Insight:  Improving  Engagement in Group:  Engaged  Modes of Intervention:  Discussion and Education  Summary of Progress/Problems: Nurse led exercise on improving, strengthening self esteem.  Lawrence Marseilles 01/24/2017, 2:33 PM

## 2017-01-24 NOTE — Progress Notes (Signed)
D: Spoke with patient 1:1 who is up and visible on the unit. Patient's affect flat, anxious with congruent mood. Rating depression at a 5/10, hopelessness at a 4/10 and anxiety at an 8/10. Rates sleep as good, appetite as fair, energy as low and concentration as poor.  States goal for today is to "adjust to meds." Chronic back pain rated at a 3/10, complaining of some sinus pressure.   A: Medicated per orders, no prns given or requested. Encouraged to speak with MD regarding sinus complaints. Emotional support offered and self inventory reviewed. Encouraged completion of Suicide Safety Plan. Discussed POC with MD, SW.  Fall prevention plan in place and reviewed.   R: Patient verbalizes understanding of POC, falls teaching. On reassess of pain (pt given scheduled naproxen), patient rates pain at a 0/10. Patient denies SI/HI and remains safe on level III obs. States no AVH x 24 hours and will notify this writer should that change.

## 2017-01-24 NOTE — Plan of Care (Signed)
Problem: Education: Goal: Verbalization of understanding the information provided will improve Outcome: Progressing Patient verbalizes understanding of information, education provided.  Problem: Safety: Goal: Ability to disclose and discuss suicidal ideas will improve Outcome: Progressing Patient denying SI. Verbal contract for safety in place.

## 2017-01-24 NOTE — Progress Notes (Signed)
Patient attended group and said that her day was a 5 out of 10.  Something positive that happened to her today was she had lots of sleep.

## 2017-01-24 NOTE — Progress Notes (Signed)
[facility] MD Progress Note  01/24/2017 4:45 PM Carla Park  MRN:  [id number] Subjective:   36 yo Caucasian female, married, lives with her family. 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 Voluntary admission on account of suicidal thoughts. Expressed plans to cut self with a razor. Has been very distressed lately. Recent loss of privileges at the Methadone clinic and DUI as her main stressors. BAL was 149 mg/dl at presentation. UDS was negative. Lipase is elevated but trending down. Elevated lAST, ALT and serum alkaline phosphatase.   Chart reviewed today. Patient discussed at team  Nursing staff reports that has been appropriate. No behavioral issues. She has not voiced any futility thoughts.   Seen today. Gradually getting better. No hallucinations. Not expressing any delusional theme.  No suicidal or homicidal thoughts.  Principal Problem: MDD (major depressive disorder), recurrent severe, without psychosis ([place]) Diagnosis:   Patient Active Problem List   Diagnosis Date Noted  . MDD (major depressive disorder), recurrent severe, without psychosis ([place]) [F33.2] 01/14/2017  . Long-term current use of methadone for opiate dependence ([place]) [F11.20] 06/18/2015  . Alcohol withdrawal ([place]) [F10.239] 05/29/2015  . Chronic pain [G89.29]   . Fibromyalgia [M79.7]   . Hep C w/ coma, chronic ([place]) [B18.2]   . Migraines [G43.909] 11/18/2012  . RA (rheumatoid arthritis) ([place]) [M06.9] 11/18/2012   Total Time spent with patient: 20 minutes  Past Psychiatric History: As in H&P  Past Medical History:  Past Medical History:  Diagnosis Date  . Arthritis   . Chronic pain   . Fibromyalgia   . Hep C w/ coma, chronic (HCC)     Past Surgical History:  Procedure Laterality Date  . ABDOMINAL HYSTERECTOMY    . APPENDECTOMY    . CESAREAN SECTION    . CHOLECYSTECTOMY    . KNEE SURGERY     Family History:  Family History  Problem Relation Age of Onset  . Multiple sclerosis Mother    Family  Psychiatric  History: As in H&P Social History:  History  Alcohol Use  . Yes    Comment: 2-500 ml boxes daily at least     History  Drug Use No    Social History   Social History  . Marital status: Single    Spouse name: N/A  . Number of children: N/A  . Years of education: N/A   Social History Main Topics  . Smoking status: Current Every Day Smoker    Packs/day: 1.00    Types: Cigarettes  . Smokeless tobacco: Never Used  . Alcohol use Yes     Comment: 2-500 ml boxes daily at least  . Drug use: No  . Sexual activity: Yes    Birth control/ protection: Surgical   Other Topics Concern  . None   Social History Narrative  . None   Additional Social History:    Pain Medications: see MAR Prescriptions: see MAR Over the Counter: see MAR History of alcohol / drug use?: Yes Negative Consequences of Use: Financial, Legal, Personal relationships, Work / School Withdrawal Symptoms: Diarrhea, Tachycardia, Sweats, Irritability, Tremors Name of Substance 1: alcohol 1 - Age of First Use: 11 or 12 1 - Amount (size/oz): 2 or 3 of the 500 ml box wines 1 - Frequency: daily 1 - Duration: the past year 1 - Last Use / Amount: 01/13/17 Name of Substance 2: opiates 2 - Age of First Use: 16 2 - Amount (size/oz): unknown 2 - Frequency: daily 2 - Duration: years 2 - Last  Use / Amount: currently on methadone through clinic     Sleep: Good  Appetite:  Good  Current Medications: Current Facility-Administered Medications  Medication Dose Route Frequency Provider Last Rate Last Dose  . alum & mag hydroxide-simeth (MAALOX/MYLANTA) 200-200-20 MG/5ML suspension 30 mL  30 mL Oral Q4H PRN Ethelene Hal, NP      . bisacodyl (DULCOLAX) suppository 10 mg  10 mg Rectal Daily PRN Artist Beach, MD   10 mg at 01/22/17 1212  . escitalopram (LEXAPRO) tablet 20 mg  20 mg Oral QHS Derrill Center, NP   20 mg at 01/23/17 2114  . feeding supplement (ENSURE ENLIVE) (ENSURE ENLIVE) liquid  237 mL  237 mL Oral BID BM Hampton Abbot, MD   237 mL at 01/22/17 0747  . gabapentin (NEURONTIN) capsule 300 mg  300 mg Oral TID Derrill Center, NP   300 mg at 01/24/17 1214  . haloperidol (HALDOL) tablet 5 mg  5 mg Oral BID Izediuno, Laruth Bouchard, MD   5 mg at 01/24/17 0830  . hydrOXYzine (ATARAX/VISTARIL) tablet 25 mg  25 mg Oral Q6H PRN Ethelene Hal, NP   25 mg at 01/23/17 2113  . lactulose (CHRONULAC) 10 GM/15ML solution 10 g  10 g Oral TID Patriciaann Clan E, PA-C   10 g at 01/24/17 1214  . magnesium hydroxide (MILK OF MAGNESIA) suspension 30 mL  30 mL Oral Daily PRN Ethelene Hal, NP      . methadone (DOLOPHINE) tablet 160 mg  160 mg Oral Daily Lindell Spar I, NP   160 mg at 01/24/17 0831  . methocarbamol (ROBAXIN) tablet 500 mg  500 mg Oral Q6H PRN Laverle Hobby, PA-C   500 mg at 01/21/17 2001  . multivitamin with minerals tablet 1 tablet  1 tablet Oral Daily Ethelene Hal, NP   1 tablet at 01/24/17 0831  . naproxen (NAPROSYN) tablet 500 mg  500 mg Oral BID WC Patriciaann Clan E, PA-C   500 mg at 01/24/17 0830  . nicotine (NICODERM CQ - dosed in mg/24 hours) patch 21 mg  21 mg Transdermal Daily Hampton Abbot, MD   21 mg at 01/24/17 0834  . pantoprazole (PROTONIX) EC tablet 40 mg  40 mg Oral Daily Laverle Hobby, PA-C   40 mg at 01/24/17 0831  . thiamine (VITAMIN B-1) tablet 100 mg  100 mg Oral Daily Ethelene Hal, NP   100 mg at 01/24/17 0831  . traZODone (DESYREL) tablet 100 mg  100 mg Oral QHS Derrill Center, NP   100 mg at 01/23/17 2113    Lab Results:  Results for orders placed or performed during the hospital encounter of 01/14/17 (from the past 48 hour(s))  Comprehensive metabolic panel     Status: Abnormal   Collection Time: 01/23/17  6:04 AM  Result Value Ref Range   Sodium 140 135 - 145 mmol/L   Potassium 4.0 3.5 - 5.1 mmol/L   Chloride 102 101 - 111 mmol/L   CO2 29 22 - 32 mmol/L   Glucose, Bld 82 65 - 99 mg/dL   BUN 16 6 - 20 mg/dL    Creatinine, Ser 1.00 0.44 - 1.00 mg/dL   Calcium 9.3 8.9 - 10.3 mg/dL   Total Protein 6.6 6.5 - 8.1 g/dL   Albumin 3.7 3.5 - 5.0 g/dL   AST 172 (H) 15 - 41 U/L   ALT 278 (H) 14 - 54 U/L   Alkaline Phosphatase  129 (H) 38 - 126 U/L   Total Bilirubin 0.5 0.3 - 1.2 mg/dL   GFR calc non Af Amer >60 >60 mL/min   GFR calc Af Amer >60 >60 mL/min    Comment: (NOTE) The eGFR has been calculated using the CKD EPI equation. This calculation has not been validated in all clinical situations. eGFR's persistently <60 mL/min signify possible Chronic Kidney Disease.    Anion gap 9 5 - 15    Comment: Performed at [facility], [place] [address], [place], [place] [address]    Blood Alcohol level:  Lab Results  Component Value Date   ETH 149 (H) 01/13/2017   ETH <5 19/14/7829    Metabolic Disorder Labs: No results found for: HGBA1C, MPG No results found for: PROLACTIN No results found for: CHOL, TRIG, HDL, CHOLHDL, VLDL, LDLCALC  Physical Findings: AIMS: Facial and Oral Movements Muscles of Facial Expression: None, normal Lips and Perioral Area: None, normal Jaw: None, normal Tongue: None, normal,Extremity Movements Upper (arms, wrists, hands, fingers): None, normal Lower (legs, knees, ankles, toes): None, normal, Trunk Movements Neck, shoulders, hips: None, normal, Overall Severity Severity of abnormal movements (highest score from questions above): None, normal Incapacitation due to abnormal movements: None, normal Patient's awareness of abnormal movements (rate only patient's report): No Awareness, Dental Status Current problems with teeth and/or dentures?: No Does patient usually wear dentures?: No  CIWA:  CIWA-Ar Total: 0 COWS:  COWS Total Score: 1  Musculoskeletal: Strength & Muscle Tone: within normal limits Gait & Station: normal Patient leans: N/A  Psychiatric Specialty Exam: Physical Exam  Constitutional: She is oriented to person, place, and time. She  appears well-developed and well-nourished.  HENT:  Head: Normocephalic and atraumatic.  Eyes: Conjunctivae are normal. Pupils are equal, round, and reactive to light.  Neck: Normal range of motion. Neck supple.  Cardiovascular: Normal rate and regular rhythm.   Respiratory: Effort normal and breath sounds normal.  GI: Soft.  Musculoskeletal: Normal range of motion.  Neurological: She is alert and oriented to person, place, and time.  Skin: Skin is warm and dry.  Psychiatric:  As above    ROS  Blood pressure 139/78, pulse 84, temperature 97.9 F (36.6 C), temperature source Oral, resp. rate 16, height _0  (1.549 m), weight 52.2 kg (115 lb).Body mass index is 21.73 kg/m.  General Appearance: Calm and cooperative. Appropriate and does not appear internally distracted.   Eye Contact:  Good  Speech:  Normal Rate  Volume:  Normal  Mood:  Improving   Affect:  Restricted but appropriate  Thought Process:  Linear  Orientation:  Full (Time, Place, and Person)  Thought Content: No delusional theme. No preoccupation with violent thoughts.  No hallucination in any modality.   Suicidal Thoughts:  None  Homicidal Thoughts:  No  Memory:  Did not  assess at this time.   Judgement:  Better  Insight:  Partial   Psychomotor Activity:  Normal  Concentration:  Better  Recall:  Did not assess at this time.   Fund of Knowledge:  Fair  Language:  Good  Akathisia:    Handed:    AIMS (if indicated):     Assets:  Desire for Improvement Housing Intimacy  ADL's:  Intact  Cognition:  WNL  Sleep:  Number of Hours: 6.75     Treatment Plan Summary: Patient is  responding to treatment. She has agreed to so outpatient addiction treatment. She is still frail and vulnerable. Hopeful discharge early next  week.   Psychiatric: SUD Psychotic Disorder  Medical: HCV RA Liver disease  Psychosocial:   PLAN: 1. Continue current regimen 2. Continue to monitor mood, behavior and interaction with  peers 3. Hopeful discharge early next week.    Artist Beach, MD 01/24/2017, 4:45 PMPatient ID: Ann Held, female   DOB: 08-Aug-1981, 36 y.o.   MRN: [id number] Patient ID: Tarisha Fader, female   DOB: 06-Oct-1980, 36 y.o.   MRN: [id number] Patient ID: Riana Tessmer, female   DOB: 1980/10/30, 36 y.o.   MRN: [id number] Patient ID: Harry Bark, female   DOB: [birth year], 36 y.o.   MRN: [id number]

## 2017-01-24 NOTE — Progress Notes (Signed)
D.  Pt pleasant but anxious on approach, no other complaints voiced.  Pt was positive for evening wrap up group with appropriate participation.  Pt observed interacting appropriately with peers within the milieu.  Pt denies SI/HI/hallucinations at this time.  A.  Support and encouragement offered, medication given as ordered  R.  Pt remains safe on the unit,w ill continue to monitor.

## 2017-01-25 NOTE — Progress Notes (Signed)
D: Pt denies SI/HI/AVH. Pt is pleasant and cooperative. Pt had minimal interaction this evening. Pt stated she did not want the Haldol because it makes her sleepy.   A: Pt was offered support and encouragement. Pt was given scheduled medications. Pt was encourage to attend groups. Q 15 minute checks were done for safety.   R: safety maintained on unit.

## 2017-01-25 NOTE — [facility] Group Notes (Signed)
[facility] Group Notes:  (Nursing/MHT/Case Management/Adjunct)  Date:  01/25/2017  Time:  4:22 PM  Type of Therapy:  Psychoeducational Skills  Participation Level:  Active  Participation Quality:  Appropriate  Affect:  Appropriate  Cognitive:  Appropriate  Insight:  Appropriate  Engagement in Group:  Engaged  Modes of Intervention:  Problem-solving   Group encouraged to surround themselves with positive and healthy group/support system when changing to a healthy life style.    Bethann Punches 01/25/2017, 4:22 PM

## 2017-01-25 NOTE — Plan of Care (Signed)
Problem: Role Relationship: Goal: Ability to demonstrate positive changes in social behaviors and relationships will improve Outcome: Progressing Patient hs spent a lot of time in her room, or in the dayroom, but mostly by herself and not interacting with peers of staff much.

## 2017-01-25 NOTE — [facility] Group Notes (Signed)
[facility] LCSW Group Therapy  01/25/2017 10 AM  Type of Therapy:  Group Therapy  Participation Level:  Did Not Attend; invited to participate yet did not despite overhead announcement and encouragement by staff    Zeno Hickel C Aaryav Hopfensperger, LCSW 

## 2017-01-25 NOTE — Progress Notes (Signed)
[facility] MD Progress Note  01/25/2017 1:43 PM Carla Park  MRN:  [id number]  Subjective:  Patient seen resting in bedroom, Patient reports" feeling tired today." patient reports I am going to discharge home and wait for a bed to open at [facility], reports she has been rejected due to her use of methadone.  Patient reports mild depression and states she is interested in attending outpatient for continue care. Patient denies suicidal or homicidal ideation. Patient auditory or visual hallucinations. Support, Reassurance and Encouragement was provided.    Principal Problem: MDD (major depressive disorder), recurrent severe, without psychosis (HCC) Diagnosis:   Patient Active Problem List   Diagnosis Date Noted  . MDD (major depressive disorder), recurrent severe, without psychosis (HCC) [F33.2] 01/14/2017  . 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 Alcohol withdrawal (HCC) [F10.239] 05/29/2015  . Chronic pain [G89.29]   . Fibromyalgia [M79.7]   . 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 Migraines [G43.909] 11/18/2012  . RA (rheumatoid arthritis) (HCC) [M06.9] 11/18/2012   Total Time spent with patient: 20 minutes  Past Psychiatric History: As in H&P  Past Medical History:  Past Medical History:  Diagnosis Date  . Arthritis   . Chronic pain   . Fibromyalgia   . Hep C w/ coma, chronic (HCC)     Past Surgical History:  Procedure Laterality Date  . ABDOMINAL HYSTERECTOMY    . APPENDECTOMY    . CESAREAN SECTION    . CHOLECYSTECTOMY    . KNEE SURGERY     Family History:  Family History  Problem Relation Age of Onset  . Multiple sclerosis Mother    Family Psychiatric  History: As in H&P Social History:  History  Alcohol Use  . Yes    Comment: 2-500 ml boxes daily at least     History  Drug Use No    Social History   Social History  . Marital status: Single    Spouse name: N/A  . Number of children: N/A  . Years of education: N/A    Social History Main Topics  . Smoking status: Current Every Day Smoker    Packs/day: 1.00    Types: Cigarettes  . Smokeless tobacco: Never Used  . Alcohol use Yes     Comment: 2-500 ml boxes daily at least  . Drug use: No  . Sexual activity: Yes    Birth control/ protection: Surgical   Other Topics Concern  . None   Social History Narrative  . None   Additional Social History:    Pain Medications: see MAR Prescriptions: see MAR Over the Counter: see MAR History of alcohol / drug use?: Yes Negative Consequences of Use: Financial, Legal, Personal relationships, Work / School Withdrawal Symptoms: Diarrhea, Tachycardia, Sweats, Irritability, Tremors Name of Substance 1: alcohol 1 - Age of First Use: 11 or 12 1 - Amount (size/oz): 2 or 3 of the 500 ml box wines 1 - Frequency: daily 1 - Duration: the past year 1 - Last Use / Amount: 01/13/17 Name of Substance 2: opiates 2 - Age of First Use: 16 2 - Amount (size/oz): unknown 2 - Frequency: daily 2 - Duration: years 2 - Last Use / Amount: currently on methadone through clinic     Sleep: Good  Appetite:  Good  Current Medications: Current Facility-Administered Medications  Medication Dose Route Frequency Provider Last Rate Last Dose  . alum & mag hydroxide-simeth (MAALOX/MYLANTA) 200-200-20 MG/5ML suspension 30 mL  30 mL Oral Q4H PRN [name], NP      . bisacodyl (DULCOLAX) suppository 10 mg  10 mg Rectal Daily PRN Georgiann Cocker, MD   10 mg at 01/22/17 1212  . escitalopram (LEXAPRO) tablet 20 mg  20 mg Oral QHS Oneta Rack, NP   20 mg at 01/24/17 2106  . feeding supplement (ENSURE ENLIVE) (ENSURE ENLIVE) liquid 237 mL  237 mL Oral BID BM [name], MD   237 mL at 01/25/17 0751  . gabapentin (NEURONTIN) capsule 300 mg  300 mg Oral TID Oneta Rack, NP   300 mg at 01/25/17 1151  . haloperidol (HALDOL) tablet 5 mg  5 mg Oral BID Izediuno, Delight Ovens, MD   5 mg at 01/24/17 1701  . hydrOXYzine  (ATARAX/VISTARIL) tablet 25 mg  25 mg Oral Q6H PRN [name], NP   25 mg at 01/24/17 2106  . lactulose (CHRONULAC) 10 GM/15ML solution 10 g  10 g Oral TID Donell Sievert E, PA-C   10 g at 01/25/17 1151  . magnesium hydroxide (MILK OF MAGNESIA) suspension 30 mL  30 mL Oral Daily PRN [name], NP      . methadone (DOLOPHINE) tablet 160 mg  160 mg Oral Daily Armandina Stammer I, NP   160 mg at 01/25/17 0748  . methocarbamol (ROBAXIN) tablet 500 mg  500 mg Oral Q6H PRN [name], PA-C   500 mg at 01/24/17 2106  . multivitamin with minerals tablet 1 tablet  1 tablet Oral Daily [name], NP   1 tablet at 01/25/17 0745  . naproxen (NAPROSYN) tablet 500 mg  500 mg Oral BID [name], PA-C   500 mg at 01/25/17 0745  . nicotine (NICODERM CQ - dosed in mg/24 hours) patch 21 mg  21 mg Transdermal Daily [name], MD   21 mg at 01/25/17 0755  . pantoprazole (PROTONIX) EC tablet 40 mg  40 mg Oral Daily [name], PA-C   40 mg at 01/25/17 0745  . thiamine (VITAMIN B-1) tablet 100 mg  100 mg Oral Daily [name], NP   100 mg at 01/25/17 0745  . traZODone (DESYREL) tablet 100 mg  100 mg Oral QHS Oneta Rack, NP   100 mg at 01/24/17 2106    Lab Results:  No results found for this or any previous visit (from the past 48 hour(s)).  Blood Alcohol level:  Lab Results  Component Value Date   ETH 149 (H) 01/13/2017   ETH <5 06/16/2015    Metabolic Disorder Labs: No results found for: HGBA1C, MPG No results found for: PROLACTIN No results found for: CHOL, TRIG, HDL, CHOLHDL, VLDL, LDLCALC  Physical Findings: AIMS: Facial and Oral Movements Muscles of Facial Expression: None, normal Lips and Perioral Area: None, normal Jaw: None, normal Tongue: None, normal,Extremity Movements Upper (arms, wrists, hands, fingers): None, normal Lower (legs, knees, ankles, toes): None, normal, Trunk Movements Neck, shoulders, hips: None, normal,  Overall Severity Severity of abnormal movements (highest score from questions above): None, normal Incapacitation due to abnormal movements: None, normal Patient's awareness of abnormal movements (rate only patient's report): No Awareness, Dental Status Current problems with teeth and/or dentures?: No Does patient usually wear dentures?: No  CIWA:  CIWA-Ar Total: 0 COWS:  COWS Total Score: 1  Musculoskeletal: Strength & Muscle Tone: within normal limits Gait & Station: normal Patient leans: N/A  Psychiatric Specialty Exam: Physical Exam  Constitutional: She is oriented to person, place, and time. She appears well-developed and well-nourished.  HENT:  Head: Normocephalic and atraumatic.  Eyes: Conjunctivae are normal. Pupils are equal, round, and reactive to light.  Neck: Normal range of motion. Neck supple.  Cardiovascular: Normal rate and regular rhythm.   Respiratory: Effort normal and breath sounds normal.  GI: Soft.  Musculoskeletal: Normal range of motion.  Neurological: She is alert and oriented to person, place, and time.  Skin: Skin is warm and dry.  Psychiatric:  As above    Review of Systems  Psychiatric/Behavioral: Positive for depression and substance abuse. Negative for suicidal ideas. The patient is nervous/anxious and has insomnia.     Blood pressure (!) 141/95, pulse 87, temperature 98.9 F (37.2 C), resp. rate 18, height 5\' 1"  (1.549 m), weight 52.2 kg (115 lb).Body mass index is 21.73 kg/m.  General Appearance: Calm and cooperative.   Eye Contact:  Good  Speech:  Normal Rate  Volume:  Normal  Mood:  Improving   Affect:  Restricted but appropriate  Thought Process:  Linear  Orientation:  Full (Time, Place, and Person)  Thought Content: WNL  Suicidal Thoughts:  None  Homicidal Thoughts:  No  Memory:  Remote fair, Recent fair   Judgement:  Better  Insight:  Partial   Psychomotor Activity:  Normal  Concentration:  Better and improving   Recall: Fair   Fund of Knowledge:  Fair  Language:  Good  Akathisia:    Handed:    AIMS (if indicated):     Assets:  Desire for Improvement Housing Intimacy  ADL's:  Intact  Cognition:  WNL  Sleep:  Number of Hours: 6.25     I agree with current treatment plan on 01/25/2017, Patient seen face-to-face for psychiatric evaluation follow-up, chart reviewed. Reviewed the information documented and agree with the treatment plan.  Treatment Plan Summary: Daily contact with patient to assess and evaluate symptoms and progress in treatment and Medication management  Continue with Neurontin 300, Haldol 5mg  for mood stabilization. Continue with Trazodone 100 mg for insomnia  Will continue to monitor vitals ,medication compliance and treatment side effects while patient is here.  CSW will start working on disposition.  Patient to participate in therapeutic milieu  -continue with current treatment plan 01/25/2017 listed below except where noted  Psychiatric: SUD Psychotic Disorder  Medical: HCV RA Liver disease  Psychosocial:   PLAN: 1. Continue current regimen 2. Continue to monitor mood, behavior and interaction with peers 3. Hopeful discharge early next week.    , NP 01/25/2017, 1:43 PM

## 2017-01-25 NOTE — Progress Notes (Signed)
Data. Patient denies SI/HI/AVH. Patient interacting well with staff and other patients, though minimally.Affect remains flat and continues to only brighten slightly with interaction. Patient did complain of feeling, "Really sleepy all the time. I think it's that haldol. I am not hearing and seeing things any more. I think it was the withdrawals." Patient did not take her AM and PM haldol. Action. Emotional support and encouragement offered. Education provided on medication, indications and side effect. Q 15 minute checks done for safety. Response. Safety on the unit maintained through 15 minute checks.  Medications taken as prescribed. Attended groups, though fell asleep in both. Remained calm and appropriate through out shift.

## 2017-01-25 NOTE — Progress Notes (Signed)
Patient did attend the evening speaker AA meeting.  

## 2017-01-26 NOTE — Progress Notes (Signed)
  DATA ACTION RESPONSE  Objective- Pt. is visible in the dayroom, seen interacting with peers. Presents with an anxious     affect and mood. Pt's plan is to be d/c on wednesday to home. No further c/o. No abnormal s/s.  Subjective- Denies having any SI/HI/AVH/Pain at this time. Pt. states " My husband has been a good support system". Is cooperative and remain safe on the unit.  1:1 interaction in private to establish rapport. Encouragement, education, & support given from staff.  PRN Vistaril and Robaxin requested and will re-eval accordingly.   Safety maintained with Q 15 checks. Continue with POC.

## 2017-01-26 NOTE — Progress Notes (Signed)
Patient ID: Carla Park, female   DOB: 11-24-1980, 36 y.o.   MRN: [id number] D:Affect is sad/flat at times,mood is depressed. States that she wants to work on getting her meds adjusted. Rates her depression  as a 5 and anxiety as a 7. A:Support and encouragement offered. R:Receptive. No complaints of pain or problems at this time.

## 2017-01-26 NOTE — Plan of Care (Signed)
Problem: Safety: Goal: Periods of time without injury will increase Outcome: Progressing Pt. denies SI/HI/AVH at this time, remains a low fall risk, Q 15 checks in effect.    

## 2017-01-26 NOTE — [facility] Group Notes (Signed)
[facility] LCSW Group Therapy  01/26/2017 4:21 PM  Type of Therapy:  Group Therapy  Participation Level:  Active  Participation Quality:  Attentive  Affect:  Tearful  Cognitive:  Oriented  Insight:  Improving  Engagement in Therapy:  Improving  Modes of Intervention:  Discussion, Education, Exploration, Socialization and Support  Summary of Progress/Problems: Today's Topic: Overcoming Obstacles. Patients identified one short term goal and potential obstacles in reaching this goal. Patients processed barriers involved in overcoming these obstacles. Patients identified steps necessary for overcoming these obstacles and explored motivation (internal and external) for facing these difficulties head on. Jacquese was tearful throughout group stating that her husband continues to blame her for alcoholism and "does not understand my illness." Janya was open to giving her husband support information including Al-Anon and Mental [employer] Association information. She asked that social worker call him to inform him of this "He won't do it if I ask."   [employer] LCSW 01/26/2017, 4:21 PM

## 2017-01-26 NOTE — [facility] Group Notes (Signed)
PT SUMMARY  Carla Park was present throughout group.  Attentive and participatory in group discussion.  She offered support to other group members throughout group.  Stated that she harbored grief around substance use - remembering things she had done and places she had been.  Stated she was often surprised at herself and recognized that the things she was doing were not things she would choose.    [name] described her spouse's grief around the death of his father.  Stated he used alcohol to cope and this almost ended their marriage.  Encouraged others to lean on support in their lives.       GROUP SUMMARY   Pt attended spiritual care group on grief and loss facilitated by chaplain Burnis Kingfisher   Group opened with brief discussion and psycho-social ed around grief and loss in relationships and in relation to self - identifying life patterns, circumstances, changes that cause losses. Established group norm of speaking from own life experience. Group goal of establishing open and affirming space for members to share loss and experience with grief, normalize grief experience and provide psycho social education and grief support.    [facility] / [facility] chaplain Burnis Kingfisher, MDiv  Page [phone]  Office [phone]

## 2017-01-26 NOTE — Progress Notes (Signed)
Pt attend wrap up group. His day was a 5. His goal adjust and try to get his medication to work for him. He need correction on the medication he's using and he need to get a clear direction for his life.

## 2017-01-26 NOTE — Progress Notes (Signed)
Recreation Therapy Notes  Date: 01/26/17 Time: 0930 Location: 300 Hall Group Room  Group Topic: Stress Management  Goal Area(s) Addresses:  Patient will verbalize importance of using healthy stress management.  Patient will identify positive emotions associated with healthy stress management.   Behavioral Response: Engaged  Intervention: Stress Management  Activity :  Meditation.  LRT introduced the stress management technique of meditation.  LRT played a meditation from the Calm app to allow patients to participate in meditating.  Patients were to follow along with the meditation to fully engage in the technique.  Education:  Stress Management, Discharge Planning.   Education Outcome: Acknowledges edcuation/In group clarification offered/Needs additional education  Clinical Observations/Feedback: Pt attended group.   Caroll Rancher, LRT/CTRS         Caroll Rancher A 01/26/2017 12:30 PM

## 2017-01-26 NOTE — Tx Team (Signed)
Interdisciplinary Treatment and Diagnostic Plan Update  01/26/2017 Time of Session: [place] MRN: [id number]  Principal Diagnosis: Alcohol Use Disorder  Secondary Diagnoses: Principal Problem:   MDD (major depressive disorder), recurrent severe, without psychosis ([place]) Active Problems:   Chronic pain   Alcohol withdrawal ([place])   Long-term current use of methadone for opiate dependence ([place])   Current Medications:  Current Facility-Administered Medications  Medication Dose Route Frequency Provider Last Rate Last Dose  . alum & mag hydroxide-simeth (MAALOX/MYLANTA) 200-200-20 MG/5ML suspension 30 mL  30 mL Oral Q4H PRN Ethelene Hal, NP      . bisacodyl (DULCOLAX) suppository 10 mg  10 mg Rectal Daily PRN Artist Beach, MD   10 mg at 01/22/17 1212  . escitalopram (LEXAPRO) tablet 20 mg  20 mg Oral QHS Derrill Center, NP   20 mg at 01/25/17 2125  . feeding supplement (ENSURE ENLIVE) (ENSURE ENLIVE) liquid 237 mL  237 mL Oral BID BM Hampton Abbot, MD   237 mL at 01/25/17 0751  . gabapentin (NEURONTIN) capsule 300 mg  300 mg Oral TID Derrill Center, NP   300 mg at 01/26/17 0849  . haloperidol (HALDOL) tablet 5 mg  5 mg Oral BID Artist Beach, MD   5 mg at 01/24/17 1701  . hydrOXYzine (ATARAX/VISTARIL) tablet 25 mg  25 mg Oral Q6H PRN Ethelene Hal, NP   25 mg at 01/25/17 2125  . lactulose (CHRONULAC) 10 GM/15ML solution 10 g  10 g Oral TID Patriciaann Clan E, PA-C   10 g at 01/26/17 0848  . magnesium hydroxide (MILK OF MAGNESIA) suspension 30 mL  30 mL Oral Daily PRN Ethelene Hal, NP      . methadone (DOLOPHINE) tablet 160 mg  160 mg Oral Daily Lindell Spar I, NP   160 mg at 01/26/17 0849  . methocarbamol (ROBAXIN) tablet 500 mg  500 mg Oral Q6H PRN Laverle Hobby, PA-C   500 mg at 01/24/17 2106  . multivitamin with minerals tablet 1 tablet  1 tablet Oral Daily Ethelene Hal, NP   1 tablet at 01/26/17 0850  . naproxen (NAPROSYN)  tablet 500 mg  500 mg Oral BID WC Patriciaann Clan E, PA-C   500 mg at 01/26/17 0849  . nicotine (NICODERM CQ - dosed in mg/24 hours) patch 21 mg  21 mg Transdermal Daily Hampton Abbot, MD   21 mg at 01/26/17 0850  . pantoprazole (PROTONIX) EC tablet 40 mg  40 mg Oral Daily Patriciaann Clan E, PA-C   40 mg at 01/26/17 0849  . thiamine (VITAMIN B-1) tablet 100 mg  100 mg Oral Daily Ethelene Hal, NP   100 mg at 01/26/17 0850  . traZODone (DESYREL) tablet 100 mg  100 mg Oral QHS Derrill Center, NP   100 mg at 01/24/17 2106   PTA Medications: Prescriptions Prior to Admission  Medication Sig Dispense Refill Last Dose  . acetaminophen (TYLENOL) 325 MG tablet Take 650 mg by mouth every 6 (six) hours as needed for mild pain.   PRN at PRN  . amoxicillin-clavulanate (AUGMENTIN) 875-125 MG tablet Take 1 tablet by mouth every 12 (twelve) hours. (Patient not taking: Reported on 01/13/2017) 20 tablet 0 Not Taking at Unknown time  . methadone (DOLOPHINE) 10 MG/ML solution Take 160 mg by mouth daily.    01/12/2017 at am  . Multiple Vitamin (MULTIVITAMIN WITH MINERALS) TABS tablet Take 1 tablet by mouth 3 (three) times daily. (  Patient not taking: Reported on 01/13/2017) 30 tablet 0 Not Taking at Unknown time  . naproxen (NAPROSYN) 250 MG tablet Take 1 tablet (250 mg total) by mouth 2 (two) times daily with a meal. (Patient not taking: Reported on 01/13/2017) 30 tablet 0 Not Taking at Unknown time  . ondansetron (ZOFRAN ODT) 4 MG disintegrating tablet Take 1 tablet (4 mg total) by mouth every 8 (eight) hours as needed for nausea or vomiting. (Patient not taking: Reported on 01/13/2017) 10 tablet 0 Not Taking at Unknown time    Patient Stressors: Financial difficulties Health problems Legal issue Occupational concerns Substance abuse  Patient Strengths: Capable of independent living Curator fund of knowledge Supportive family/friends Work skills  Treatment Modalities: Medication  Management, Group therapy, Case management,  1 to 1 session with clinician, Psychoeducation, Recreational therapy.   Physician Treatment Plan for Primary Diagnosis: Alcohol Use Disorder  Medication Management: Evaluate patient's response, side effects, and tolerance of medication regimen.  Therapeutic Interventions: 1 to 1 sessions, Unit Group sessions and Medication administration.  Evaluation of Outcomes: Progressing  Physician Treatment Plan for Secondary Diagnosis: Principal Problem:   MDD (major depressive disorder), recurrent severe, without psychosis ([place]) Active Problems:   Chronic pain   Alcohol withdrawal ([place])   Long-term current use of methadone for opiate dependence ([place])  Long Term Goal(s): Improvement in symptoms so as ready for discharge Improvement in symptoms so as ready for discharge   Short Term Goals: Ability to identify changes in lifestyle to reduce recurrence of condition will improve Ability to verbalize feelings will improve Ability to disclose and discuss suicidal ideas Ability to demonstrate self-control will improve Ability to identify and develop effective coping behaviors will improve Ability to maintain clinical measurements within normal limits will improve Ability to identify triggers associated with substance abuse/mental health issues will improve     Medication Management: Evaluate patient's response, side effects, and tolerance of medication regimen.  Therapeutic Interventions: 1 to 1 sessions, Unit Group sessions and Medication administration.  Evaluation of Outcomes: Progressing   RN Treatment Plan for Primary Diagnosis: Alcohol Use Disorder Long Term Goal(s): Knowledge of disease and therapeutic regimen to maintain health will improve  Short Term Goals: Ability to remain free from injury will improve, Ability to verbalize feelings will improve and Ability to disclose and discuss suicidal ideas  Medication Management: RN will  administer medications as ordered by provider, will assess and evaluate patient's response and provide education to patient for prescribed medication. RN will report any adverse and/or side effects to prescribing provider.  Therapeutic Interventions: 1 on 1 counseling sessions, Psychoeducation, Medication administration, Evaluate responses to treatment, Monitor vital signs and CBGs as ordered, Perform/monitor CIWA, COWS, AIMS and Fall Risk screenings as ordered, Perform wound care treatments as ordered.  Evaluation of Outcomes: Progressing   LCSW Treatment Plan for Primary Diagnosis: Alcohol Use Disorder Long Term Goal(s): Safe transition to appropriate next level of care at discharge, Engage patient in therapeutic group addressing interpersonal concerns.  Short Term Goals: Engage patient in aftercare planning with referrals and resources, Facilitate patient progression through stages of change regarding substance use diagnoses and concerns and Identify triggers associated with mental health/substance abuse issues  Therapeutic Interventions: Assess for all discharge needs, 1 to 1 time with Social worker, Explore available resources and support systems, Assess for adequacy in community support network, Educate family and significant other(s) on suicide prevention, Complete Psychosocial Assessment, Interpersonal group therapy.  Evaluation of Outcomes: Progressing   Progress in Treatment:  Attending groups: sometimes  Participating in groups: minimally, when she attends Taking medication as prescribed: Yes. Toleration medication: Yes. Family/Significant other contact made: SPE completed with pt's husband.  Patient understands diagnosis: Yes. Discussing patient identified problems/goals with staff: Yes. Medical problems stabilized or resolved: Yes. Denies suicidal/homicidal ideation: Yes. Issues/concerns per patient self-inventory: No. Other: n/a  New problem(s) identified: No, Describe:   n/a  New Short Term/Long Term Goal(s): detox; elimination of SI thoughts; medication stabilization; development of comprehensive mental wellness/sobriety plan.   Discharge Plan or Barriers: Pt declined at [facility]. Pt plans to return home; resume services with current methadone provdier. CSW assessing additional resources that may be helpful.  6/11: Pt plans to return home and resume services at [facility] until [facility] can assess her and get her into methadone program. [facility] pamhlet provided and pt met with peer counselor while in the hospital.   Reason for Continuation of Hospitalization: Anxiety Depression Medication stabilization  Estimated Length of Stay: 1-3 days    Attendees: Patient: 01/26/2017 9:09 AM  Physician: Dr. Sanjuana Letters MD 01/26/2017 9:09 AM  Nursing: Andria Frames RN 01/26/2017 9:09 AM  RN Care Manager: Lars Pinks CM 01/26/2017 9:09 AM  Social Worker: Press photographer, LCSW 01/26/2017 9:09 AM  Recreational Therapist: x 01/26/2017 9:09 AM  Other: Ricky Ala NP 01/26/2017 9:09 AM  Other:  01/26/2017 9:09 AM  Other: 01/26/2017 9:09 AM    Scribe for Treatment Team: [facility], LCSW 01/26/2017 9:09 AM

## 2017-01-26 NOTE — Progress Notes (Signed)
[facility] MD Progress Note  01/26/2017 1:53 PM Carla Park  MRN:  [id number] Subjective:   36 yo Caucasian female, married, lives with her family. 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 Voluntary admission on account of suicidal thoughts. Expressed plans to cut self with a razor. Has been very distressed lately. Recent loss of privileges at the Methadone clinic and DUI as her main stressors. BAL was 149 mg/dl at presentation. UDS was negative. Lipase is elevated but trending down. Elevated lAST, ALT and serum alkaline phosphatase.   Chart reviewed today. Patient discussed at team  Nursing staff reports that she has been refusing Haldol. She complains of excessive sedation with Haldol during the day. She has been more alert. She has been participating at groups. No behavioral issues. She has not been observed to be internally stimulated. She has not voiced any futility thoughts. [facility] would not take her because she is on Methadone.  Seen today. Says she has not taken Haldol in over twenty four hours. She has not had any hallucination since then. No paranoia or persecution. No suicidal thoughts. No thoughts of violence. No homicidal thoughts. Says she was on the phone with her husband. He is disappointed that she is not going into rehab. They talked about their options. Says they are both okay if she attends lots of AA meetings and engages with her sponsor.   Principal Problem: MDD (major depressive disorder), recurrent severe, without psychosis ([place]) Diagnosis:   Patient Active Problem List   Diagnosis Date Noted  . MDD (major depressive disorder), recurrent severe, without psychosis ([place]) [F33.2] 01/14/2017  . Long-term current use of methadone for opiate dependence ([place]) [F11.20] 06/18/2015  . Alcohol withdrawal ([place]) [F10.239] 05/29/2015  . Chronic pain [G89.29]   . Fibromyalgia [M79.7]   . Hep C w/ coma, chronic ([place]) [B18.2]   . Migraines [G43.909] 11/18/2012  . RA (rheumatoid  arthritis) ([place]) [M06.9] 11/18/2012   Total Time spent with patient: 20 minutes  Past Psychiatric History: As in H&P  Past Medical History:  Past Medical History:  Diagnosis Date  . Arthritis   . Chronic pain   . Fibromyalgia   . Hep C w/ coma, chronic (HCC)     Past Surgical History:  Procedure Laterality Date  . ABDOMINAL HYSTERECTOMY    . APPENDECTOMY    . CESAREAN SECTION    . CHOLECYSTECTOMY    . KNEE SURGERY     Family History:  Family History  Problem Relation Age of Onset  . Multiple sclerosis Mother    Family Psychiatric  History: As in H&P Social History:  History  Alcohol Use  . Yes    Comment: 2-500 ml boxes daily at least     History  Drug Use No    Social History   Social History  . Marital status: Single    Spouse name: N/A  . Number of children: N/A  . Years of education: N/A   Social History Main Topics  . Smoking status: Current Every Day Smoker    Packs/day: 1.00    Types: Cigarettes  . Smokeless tobacco: Never Used  . Alcohol use Yes     Comment: 2-500 ml boxes daily at least  . Drug use: No  . Sexual activity: Yes    Birth control/ protection: Surgical   Other Topics Concern  . None   Social History Narrative  . None   Additional Social History:    Pain Medications: see MAR Prescriptions: see MAR Over the  Counter: see MAR History of alcohol / drug use?: Yes Negative Consequences of Use: Financial, Legal, Personal relationships, Work / School Withdrawal Symptoms: Diarrhea, Tachycardia, Sweats, Irritability, Tremors Name of Substance 1: alcohol 1 - Age of First Use: 11 or 12 1 - Amount (size/oz): 2 or 3 of the 500 ml box wines 1 - Frequency: daily 1 - Duration: the past year 1 - Last Use / Amount: 01/13/17 Name of Substance 2: opiates 2 - Age of First Use: 16 2 - Amount (size/oz): unknown 2 - Frequency: daily 2 - Duration: years 2 - Last Use / Amount: currently on methadone through clinic     Sleep:  Good  Appetite:  Good  Current Medications: Current Facility-Administered Medications  Medication Dose Route Frequency Provider Last Rate Last Dose  . alum & mag hydroxide-simeth (MAALOX/MYLANTA) 200-200-20 MG/5ML suspension 30 mL  30 mL Oral Q4H PRN Ethelene Hal, NP      . bisacodyl (DULCOLAX) suppository 10 mg  10 mg Rectal Daily PRN Artist Beach, MD   10 mg at 01/22/17 1212  . escitalopram (LEXAPRO) tablet 20 mg  20 mg Oral QHS Derrill Center, NP   20 mg at 01/25/17 2125  . feeding supplement (ENSURE ENLIVE) (ENSURE ENLIVE) liquid 237 mL  237 mL Oral BID BM Hampton Abbot, MD   237 mL at 01/26/17 0911  . gabapentin (NEURONTIN) capsule 300 mg  300 mg Oral TID Derrill Center, NP   300 mg at 01/26/17 1256  . haloperidol (HALDOL) tablet 5 mg  5 mg Oral BID Izediuno, Laruth Bouchard, MD   5 mg at 01/24/17 1701  . hydrOXYzine (ATARAX/VISTARIL) tablet 25 mg  25 mg Oral Q6H PRN Ethelene Hal, NP   25 mg at 01/26/17 1256  . lactulose (CHRONULAC) 10 GM/15ML solution 10 g  10 g Oral TID Patriciaann Clan E, PA-C   10 g at 01/26/17 1256  . magnesium hydroxide (MILK OF MAGNESIA) suspension 30 mL  30 mL Oral Daily PRN Ethelene Hal, NP      . methadone (DOLOPHINE) tablet 160 mg  160 mg Oral Daily Lindell Spar I, NP   160 mg at 01/26/17 0849  . methocarbamol (ROBAXIN) tablet 500 mg  500 mg Oral Q6H PRN Laverle Hobby, PA-C   500 mg at 01/24/17 2106  . multivitamin with minerals tablet 1 tablet  1 tablet Oral Daily Ethelene Hal, NP   1 tablet at 01/26/17 0850  . naproxen (NAPROSYN) tablet 500 mg  500 mg Oral BID WC Patriciaann Clan E, PA-C   500 mg at 01/26/17 0849  . nicotine (NICODERM CQ - dosed in mg/24 hours) patch 21 mg  21 mg Transdermal Daily Hampton Abbot, MD   21 mg at 01/26/17 0850  . pantoprazole (PROTONIX) EC tablet 40 mg  40 mg Oral Daily Patriciaann Clan E, PA-C   40 mg at 01/26/17 0849  . thiamine (VITAMIN B-1) tablet 100 mg  100 mg Oral Daily Ethelene Hal, NP   100 mg at 01/26/17 0850  . traZODone (DESYREL) tablet 100 mg  100 mg Oral QHS Derrill Center, NP   100 mg at 01/24/17 2106    Lab Results:  No results found for this or any previous visit (from the past 48 hour(s)).  Blood Alcohol level:  Lab Results  Component Value Date   ETH 149 (H) 01/13/2017   ETH <5 16/05/9603    Metabolic Disorder Labs: No results found  for: HGBA1C, MPG No results found for: PROLACTIN No results found for: CHOL, TRIG, HDL, CHOLHDL, VLDL, LDLCALC  Physical Findings: AIMS: Facial and Oral Movements Muscles of Facial Expression: None, normal Lips and Perioral Area: None, normal Jaw: None, normal Tongue: None, normal,Extremity Movements Upper (arms, wrists, hands, fingers): None, normal Lower (legs, knees, ankles, toes): None, normal, Trunk Movements Neck, shoulders, hips: None, normal, Overall Severity Severity of abnormal movements (highest score from questions above): None, normal Incapacitation due to abnormal movements: None, normal Patient's awareness of abnormal movements (rate only patient's report): No Awareness, Dental Status Current problems with teeth and/or dentures?: No Does patient usually wear dentures?: No  CIWA:  CIWA-Ar Total: 0 COWS:  COWS Total Score: 1  Musculoskeletal: Strength & Muscle Tone: within normal limits Gait & Station: normal Patient leans: N/A  Psychiatric Specialty Exam: Physical Exam  Constitutional: She is oriented to person, place, and time. She appears well-developed and well-nourished.  HENT:  Head: Normocephalic and atraumatic.  Eyes: Conjunctivae are normal. Pupils are equal, round, and reactive to light.  Neck: Normal range of motion. Neck supple.  Cardiovascular: Normal rate and regular rhythm.   Respiratory: Effort normal and breath sounds normal.  GI: Soft.  Musculoskeletal: Normal range of motion.  Neurological: She is alert and oriented to person, place, and time.  Skin: Skin is  warm and dry.  Psychiatric:  As above    ROS  Blood pressure 132/86, pulse 82, temperature 98.4 F (36.9 C), resp. rate 16, height _0  (1.549 m), weight 52.2 kg (115 lb).Body mass index is 21.73 kg/m.  General Appearance: Casually dressed, calm and cooperative. Appropriate and does not appear internally distracted.   Eye Contact:  Good  Speech:  Normal Rate and tone  Volume:  Normal  Mood:  Feels much better  Affect:  Restricted but appropriate  Thought Process:  Linear  Orientation:  Full (Time, Place, and Person)  Thought Content: No delusional theme. No preoccupation with violent thoughts.  No hallucination in any modality.   Suicidal Thoughts:  No  Homicidal Thoughts:  No  Memory:  Good registration and recall.    Judgement:  Good  Insight:  Good  Psychomotor Activity:  Normal  Concentration:  Good  Recall:  Did not assess at this time.   Fund of Knowledge:  Fair  Language:  Good  Akathisia:    Handed:    AIMS (if indicated):     Assets:  Desire for Improvement Housing Intimacy  ADL's:  Intact  Cognition:  WNL  Sleep:  Number of Hours: 6.5     Treatment Plan Summary: Patient has weaned self off Haldol. She has not had any psychotic symptom in the past twenty four hours. She is not expressing any dangerousness. She is considering outpatient management. Would evaluate her overnight and hopefully discharge her tomorrow if she maintains stability.   Psychiatric: SUD Psychotic Disorder  Medical: HCV RA Liver disease  Psychosocial:   PLAN: 1. Discontinue Haldol 2. SW would facilitate aftercare 3. Hopeful discharge tomorrow.     Artist Beach, MD 01/26/2017, 1:53 PMPatient ID: Carla Park, female   DOB: Mar 05, 1981, 36 y.o.   MRN: [id number] Patient ID: Carla Park, female   DOB: [birth year], 36 y.o.   MRN: [id number] Patient ID: Carla Park, female   DOB: [birth year], 36 y.o.   MRN: [id number] Patient ID: Carla Park, female   DOB: [birth year], 36  y.o.   MRN: [id number] Patient ID: Carla Park, female   DOB: December 04, 1980,  36 y.o.   MRN: [id number]

## 2017-01-27 NOTE — Progress Notes (Signed)
Recreation Therapy Notes  Animal-Assisted Activity (AAA) Program Checklist/Progress Notes Patient Eligibility Criteria Checklist & Daily Group note for Rec TxIntervention  Date: 01/27/2017 Time: 2:55pm Location: 400 hall dayroom  AAA/T Program Assumption of Risk Form signed by Patient/ or Parent Legal Guardian Yes  Patient is free of allergies or sever asthma Yes  Patient reports no fear of animals Yes  Patient reports no history of cruelty to animals Yes  Patient understands his/her participation is voluntary Yes  Patient washes hands before animal contact Yes  Patient washes hands after animal contact Yes  Behavioral Response: engaged  Education:Hand Washing, Appropriate Animal Interaction   Education Outcome: Acknowledges education.   Clinical Observations/Feedback: Patient attended session and interacted appropriately with therapy dog and peers. Patient asked appropriate questions about therapy dog and his training.    Marvell Fuller, Recreation Therapy Intern            Marvell Fuller 01/27/2017 3:43 PM

## 2017-01-27 NOTE — Progress Notes (Signed)
D: Spoke with patient 1:1 who is up and visible in the milieu. Interacts appropriately. Remains quite anxious in affect and mood. States she is nervous about discharge tomorrow however states she will be ready. Discussed with this writer her plan for success upon discharge. Remains tremulous which is observed and states, "it's a little worse today because I'm so anxious but I will be okay." Requesting prn for symptoms. Rating depression at a 4/10, hopelessness at a 4/10 and anxiety at a 7/10. Rates sleep as good, appetite as fair, energy as normal and concentration as poor.  States goal for today is to "get mentally ready to go home, work with my case worker." Chronic back pain has been at a 4-5/10.   A: Medicated per orders, prn vistaril and robaxin. On scheduled naproxen for chronic back pan. Emotional support offered and self inventory reviewed. Encouraged completion of Suicide Safety Plan. Discussed POC with MD, SW.  Fall prevention plan in place and reviewed as patient has periods of unsteadiness, sleepiness.   R: Patient verbalizes understanding of POC, falls teaching. On reassess, patient's pain is 0/10, anxiety decreased but not resolved. Patient denies SI/HI and remains safe on level III obs.

## 2017-01-27 NOTE — Progress Notes (Signed)
[facility] MD Progress Note  01/27/2017 6:52 PM Carla Park  MRN:  [id number] Subjective:  Patient reports she is feeling better. She states she still feels some depression, but feels significantly  Better than prior to admission. She denies medication side effects. At this time denies feeling sedated and presents fully alert and attentive . Objective : I have discussed case with treatment team and have met with patient . She is a 36 year old female, who was admitted due to worsening depression, suicidal ideations. She had been drinking alcohol daily. Of note, she has history of being on methadone maintenance .  Currently patient reports feeling better, less depressed, and presents with a full range of affect. Denies medication side effects. Denies cravings for alcohol at present and states she is motivated in recovery and abstinence efforts. She states she plans to continue methadone maintenance .  As discussed with staff,patient presents partially improved, but remains anxious. Noted to be tremulous which is felt to be related to anxiety, rather than to lingering alcohol WDL. Non disruptive or agitated behaviors on unit .   Principal Problem: MDD (major depressive disorder), recurrent severe, without psychosis (HCC) Diagnosis:   Patient Active Problem List   Diagnosis Date Noted  . MDD (major depressive disorder), recurrent severe, without psychosis (HCC) [F33.2] 01/14/2017  . 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 Alcohol withdrawal (HCC) [F10.239] 05/29/2015  . Chronic pain [G89.29]   . Fibromyalgia [M79.7]   . 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 Migraines [G43.909] 11/18/2012  . RA (rheumatoid arthritis) (HCC) [M06.9] 11/18/2012   Total Time spent with patient: 20 minutes  Past Psychiatric History: As in H&P  Past Medical History:  Past Medical History:  Diagnosis Date  . Arthritis   . Chronic pain   . Fibromyalgia   . Hep C  w/ coma, chronic (HCC)     Past Surgical History:  Procedure Laterality Date  . ABDOMINAL HYSTERECTOMY    . APPENDECTOMY    . CESAREAN SECTION    . CHOLECYSTECTOMY    . KNEE SURGERY     Family History:  Family History  Problem Relation Age of Onset  . Multiple sclerosis Mother    Family Psychiatric  History: As in H&P Social History:  History  Alcohol Use  . Yes    Comment: 2-500 ml boxes daily at least     History  Drug Use No    Social History   Social History  . Marital status: Single    Spouse name: N/A  . Number of children: N/A  . Years of education: N/A   Social History Main Topics  . Smoking status: Current Every Day Smoker    Packs/day: 1.00    Types: Cigarettes  . Smokeless tobacco: Never Used  . Alcohol use Yes     Comment: 2-500 ml boxes daily at least  . Drug use: No  . Sexual activity: Yes    Birth control/ protection: Surgical   Other Topics Concern  . None   Social History Narrative  . None   Additional Social History:    Pain Medications: see MAR Prescriptions: see MAR Over the Counter: see MAR History of alcohol / drug use?: Yes Negative Consequences of Use: Financial, Legal, Personal relationships, Work / School Withdrawal Symptoms: Diarrhea, Tachycardia, Sweats, Irritability, Tremors Name of Substance 1: alcohol 1 - Age of First Use: 11 or 12 1 - Amount (size/oz): 2 or 3  of the 500 ml box wines 1 - Frequency: daily 1 - Duration: the past year 1 - Last Use / Amount: 01/13/17 Name of Substance 2: opiates 2 - Age of First Use: 16 2 - Amount (size/oz): unknown 2 - Frequency: daily 2 - Duration: years 2 - Last Use / Amount: currently on methadone through clinic     Sleep: Good  Appetite:  Good  Current Medications: Current Facility-Administered Medications  Medication Dose Route Frequency Provider Last Rate Last Dose  . alum & mag hydroxide-simeth (MAALOX/MYLANTA) 200-200-20 MG/5ML suspension 30 mL  30 mL Oral Q4H PRN Ethelene Hal, NP      . bisacodyl (DULCOLAX) suppository 10 mg  10 mg Rectal Daily PRN Artist Beach, MD   10 mg at 01/22/17 1212  . escitalopram (LEXAPRO) tablet 20 mg  20 mg Oral QHS Derrill Center, NP   20 mg at 01/26/17 2130  . feeding supplement (ENSURE ENLIVE) (ENSURE ENLIVE) liquid 237 mL  237 mL Oral BID BM Hampton Abbot, MD   237 mL at 01/26/17 0911  . gabapentin (NEURONTIN) capsule 300 mg  300 mg Oral TID Derrill Center, NP   300 mg at 01/27/17 1204  . hydrOXYzine (ATARAX/VISTARIL) tablet 25 mg  25 mg Oral Q6H PRN Ethelene Hal, NP   25 mg at 01/27/17 1205  . lactulose (CHRONULAC) 10 GM/15ML solution 10 g  10 g Oral TID Patriciaann Clan E, PA-C   10 g at 01/27/17 1608  . magnesium hydroxide (MILK OF MAGNESIA) suspension 30 mL  30 mL Oral Daily PRN Ethelene Hal, NP      . methadone (DOLOPHINE) tablet 160 mg  160 mg Oral Daily Lindell Spar I, NP   160 mg at 01/27/17 0803  . methocarbamol (ROBAXIN) tablet 500 mg  500 mg Oral Q6H PRN Laverle Hobby, PA-C   500 mg at 01/27/17 1609  . multivitamin with minerals tablet 1 tablet  1 tablet Oral Daily Ethelene Hal, NP   1 tablet at 01/27/17 0803  . naproxen (NAPROSYN) tablet 500 mg  500 mg Oral BID WC Patriciaann Clan E, PA-C   500 mg at 01/27/17 1608  . nicotine (NICODERM CQ - dosed in mg/24 hours) patch 21 mg  21 mg Transdermal Daily Hampton Abbot, MD   21 mg at 01/27/17 0804  . pantoprazole (PROTONIX) EC tablet 40 mg  40 mg Oral Daily Laverle Hobby, PA-C   40 mg at 01/27/17 6314  . thiamine (VITAMIN B-1) tablet 100 mg  100 mg Oral Daily Ethelene Hal, NP   100 mg at 01/27/17 9702  . traZODone (DESYREL) tablet 100 mg  100 mg Oral QHS Derrill Center, NP   100 mg at 01/26/17 2130    Lab Results:  No results found for this or any previous visit (from the past 48 hour(s)).  Blood Alcohol level:  Lab Results  Component Value Date   ETH 149 (H) 01/13/2017   ETH <5 63/78/5885    Metabolic  Disorder Labs: No results found for: HGBA1C, MPG No results found for: PROLACTIN No results found for: CHOL, TRIG, HDL, CHOLHDL, VLDL, LDLCALC  Physical Findings: AIMS: Facial and Oral Movements Muscles of Facial Expression: None, normal Lips and Perioral Area: None, normal Jaw: None, normal Tongue: None, normal,Extremity Movements Upper (arms, wrists, hands, fingers): None, normal Lower (legs, knees, ankles, toes): None, normal, Trunk Movements Neck, shoulders, hips: None, normal, Overall Severity Severity of abnormal movements (  highest score from questions above): None, normal Incapacitation due to abnormal movements: None, normal Patient's awareness of abnormal movements (rate only patient's report): No Awareness, Dental Status Current problems with teeth and/or dentures?: No Does patient usually wear dentures?: No  CIWA:  CIWA-Ar Total: 0 COWS:  COWS Total Score: 1  Musculoskeletal: Strength & Muscle Tone: within normal limits Gait & Station: normal Patient leans: N/A  Psychiatric Specialty Exam: Physical Exam  Constitutional: She is oriented to person, place, and time. She appears well-developed and well-nourished.  HENT:  Head: Normocephalic and atraumatic.  Eyes: Conjunctivae are normal. Pupils are equal, round, and reactive to light.  Neck: Normal range of motion. Neck supple.  Cardiovascular: Normal rate and regular rhythm.   Respiratory: Effort normal and breath sounds normal.  GI: Soft.  Musculoskeletal: Normal range of motion.  Neurological: She is alert and oriented to person, place, and time.  Skin: Skin is warm and dry.  Psychiatric:  As above    ROS denies feeling sedated, denies chest pain, no dyspnea  Blood pressure 118/79, pulse 97, temperature 98.6 F (37 C), resp. rate 18, height '5\' 1"'$  (1.549 m), weight 52.2 kg (115 lb).Body mass index is 21.73 kg/m.  General Appearance: well groomed  Eye Contact:  Good   Speech:  Normal Rate   Volume: Normal   Mood:  Improved, states she is feeling better   Affect:  More reactive, smiles at times appropriately  Thought Process:  Linear and Descriptions of Associations: Intact  Orientation:  Full (Time, Place, and Person)  Thought Content: no hallucinations, no delusions   Suicidal Thoughts:  No- denies suicidal or self injurious ideations, denies homicidal or violent ideations  Homicidal Thoughts:  No  Memory:  Recent and remote grossly intact   Judgement:  Good   Insight:  Good   Psychomotor Activity:  Normal   Concentration:  Good  Recall:  Did not assess at this time.   Fund of Knowledge:  Good  Language:  Good  Akathisia: no     Handed:  R  AIMS (if indicated):     Assets:  Communication Skills Desire for Improvement  ADL's:  Intact  Cognition:  WNL  Sleep:  Number of Hours: 6.5   Assessment - patient is currently improving compared to admission, and reports improving mood and presenting with improving range of affect. Reports some lingering tremulousness and anxiety. Denies SI. She is tolerating medications well , including methadone, and denies sedation. She is now off Haldol.  At this time working on disposition planning options   Treatment Plan Summary: Treatment plan reviewed as below today 6/12. Continue to encourage group and milieu participation to work on coping skills and symptom reduction  Continue Lexapro 20 mgrs QDAY for depression and anxiety  Continue Neurontin 300 mgrs TID for anxiety , pain Continue Methadone 160 mgrs QDAY for pain, maintenance  *patient aware of sedation potential- states she has been on this dose for months prior to admission , denies side effects, and at this time not sedated Treatment team working on disposition planning options      Jenne Campus, MD 01/27/2017, 6:52 PM   Patient ID: Carla Park, female   DOB: 03/22/1981, 36 y.o.   MRN: [id number]

## 2017-01-27 NOTE — Plan of Care (Signed)
Problem: Coping: Goal: Ability to verbalize feelings will improve Outcome: Progressing Through 1:1 processing with this RN, patient verbalizing feelings, plan for success after discharge.  Problem: Safety: Goal: Ability to identify and utilize support systems that promote safety will improve Outcome: Progressing Patient states she has good family support, especially from her husband. Also spoke about outpatient resources.

## 2017-01-27 NOTE — Progress Notes (Signed)
D    Pt is pleasant on approach and cooperative    She attends and participates in groups and interacts appropriately with others   She endorses depression but does report some improvement A   Verbal support given   Medications administered and effectiveness monitored   Q 15 min checks R   Pt is safe at present

## 2017-01-27 NOTE — [facility] Group Notes (Signed)
[facility] Group Notes:  (Nursing/MHT/Case Management/Adjunct)  Date:  01/27/2017  Time:  0900  Type of Therapy:  Nurse Education - Successful Goal Setting  Participation Level:  Active  Participation Quality:  Appropriate  Affect:  Blunted  Cognitive:  Alert  Insight:  Appropriate  Engagement in Group:  Engaged  Modes of Intervention:  Discussion and Education  Summary of Progress/Problems:  Patient attended and participated appropriately in goal setting discussion.   Merian Capron [facility] 01/27/2017, 0930

## 2017-01-28 ENCOUNTER — Encounter (HOSPITAL_COMMUNITY): Payer: Self-pay | Admitting: Psychiatry

## 2017-01-28 DIAGNOSIS — F1020 Alcohol dependence, uncomplicated: Secondary | ICD-10-CM | POA: Diagnosis present

## 2017-01-28 MED ORDER — GABAPENTIN 300 MG PO CAPS: 300.0000 mg | ORAL_CAPSULE | Freq: Three times a day (TID) | ORAL | 0 refills | Status: DC

## 2017-01-28 MED ORDER — NICOTINE 21 MG/24HR TD PT24: 21.0000 mg | MEDICATED_PATCH | Freq: Every day | TRANSDERMAL | 0 refills | Status: DC

## 2017-01-28 MED ORDER — PANTOPRAZOLE SODIUM 40 MG PO TBEC: 40.0000 mg | DELAYED_RELEASE_TABLET | Freq: Every day | ORAL | 0 refills | Status: DC

## 2017-01-28 MED ORDER — TRAZODONE HCL 100 MG PO TABS: 100.0000 mg | ORAL_TABLET | Freq: Every day | ORAL | 0 refills | Status: DC

## 2017-01-28 MED ORDER — NAPROXEN 250 MG PO TABS: 250.0000 mg | ORAL_TABLET | Freq: Two times a day (BID) | ORAL | 0 refills | Status: DC

## 2017-01-28 MED ORDER — ADULT MULTIVITAMIN W/MINERALS CH: 1.0000 | ORAL_TABLET | Freq: Three times a day (TID) | ORAL | 0 refills | Status: DC

## 2017-01-28 MED ORDER — ESCITALOPRAM OXALATE 20 MG PO TABS: 20.0000 mg | ORAL_TABLET | Freq: Every day | ORAL | 0 refills | Status: DC

## 2017-01-28 MED ORDER — LACTULOSE 10 GM/15ML PO SOLN: 10.0000 g | Freq: Three times a day (TID) | ORAL | 0 refills | Status: DC

## 2017-01-28 MED ORDER — METHADONE HCL 10 MG/ML PO CONC: 160.0000 mg | Freq: Every day | ORAL | 0 refills | Status: DC

## 2017-01-28 MED ORDER — HYDROXYZINE HCL 25 MG PO TABS: 25.0000 mg | ORAL_TABLET | Freq: Four times a day (QID) | ORAL | 0 refills | Status: DC | PRN

## 2017-01-28 NOTE — Progress Notes (Signed)
  [facility] Adult Case Management Discharge Plan :  Will you be returning to the same living situation after discharge:  Yes,  home with husband At discharge, do you have transportation home?: Yes,  husband to pick her up this afternoon. Do you have the ability to pay for your medications: Yes,  [employer] private insurance  Release of information consent forms completed and submitted to medical records by CSW.  Patient to Follow up at: Follow-up Information    [facility] Follow up on 01/28/2017.   Specialty:  Behavioral Health Why:  Please walk in on Wednesday to be assessed for methadone maintenance program/SAIOP. Walk in hours: Mon, Wed, Fri 12:30-3:00PM. Thank you Contact information: [address] [place] [address] [phone]        [facility] Follow up.   Why:  Please resume services upon discharge. Arrive between 5am-10am daily for methadone maintenance. Thank you.  Contact information: [address] [place] [address] Phone: [phone] Fax: [phone]          Next level of care provider has access to [facility] Link:no  Safety Planning and Suicide Prevention discussed: Yes,  SPE completed with pt's husband and with patient. She was also provided with SPI pamphlet and mobile crisis information.  Have you used any form of tobacco in the last 30 days? (Cigarettes, Smokeless Tobacco, Cigars, and/or Pipes): Yes  Has patient been referred to the Quitline?: Patient refused referral  Patient has been referred for addiction treatment: Yes  Orelia Brandstetter N Smart LCSW 01/28/2017, 10:50 AM

## 2017-01-28 NOTE — Progress Notes (Signed)
Psychoeducational Group Note  Date:  01/28/2017 Time: 1104  Group Topic/Focus:  Personal Choices and Values:   The focus of this group is to help patients assess and explore the importance of values in their lives, how their values affect their decisions, how they express their values and what opposes their expression.  Participation Level: Did Not Attend  Participation Quality:  Not Applicable  Affect:  Not Applicable  Cognitive:  Not Applicable  Insight:  Not Applicable  Engagement in Group: Not Applicable  Additional Comments:  Pt was in the process of getting discharge and as a result could not attend group.  [name] E 01/28/2017, 2:09 PM

## 2017-01-28 NOTE — Progress Notes (Signed)
Recreation Therapy Notes  Date: 01/28/17 Time: 0930 Location: 300 [address] Dayroom  Group Topic: Stress Management  Goal Area(s) Addresses:  Patient will verbalize importance of using healthy stress management.  Patient will identify positive emotions associated with healthy stress management.   Intervention: Stress Managemen  Activity :  LRT introduced the stress management technique of guided imagery.  LRT read a script to allow patients to engage in the activity.  Patients were to follow along as LRT read script to participate in activity.  Education:  Stress Management, Discharge Planning.   Education Outcome: Acknowledges edcuation/In group clarification offered/Needs additional education  Clinical Observations/Feedback: Pt did not attend group.   Caroll Rancher, LRT/CTRS         Caroll Rancher A 01/28/2017 2:34 PM

## 2017-01-28 NOTE — Discharge Summary (Signed)
Physician Discharge Summary Note  Patient:  Carla Park is an 36 y.o., female MRN:  [id number] DOB:  November 02, 1980 Patient phone:  [phone] (home)  Patient address:   [address] [place] [address],  Total Time spent with patient: Greater than 30 minutres  Date of Admission:  01/14/2017 Date of Discharge: 01-28-17  Reason for Admission: Suicidal ideations due to worsening depression.  Principal Problem: MDD (major depressive disorder), recurrent severe, without psychosis [facility]) Discharge Diagnoses: Patient Active Problem List   Diagnosis Date Noted  . MDD (major depressive disorder), recurrent severe, without psychosis (HCC) [F33.2] 01/14/2017  . 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 Alcohol withdrawal (HCC) [F10.239] 05/29/2015  . Chronic pain [G89.29]   . Fibromyalgia [M79.7]   . 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 Migraines [G43.909] 11/18/2012  . RA (rheumatoid arthritis) (HCC) [M06.9] 11/18/2012   Past Psychiatric History: Alcohol use disorder, MDD  Past Medical History:  Past Medical History:  Diagnosis Date  . Arthritis   . Chronic pain   . Fibromyalgia   . Hep C w/ coma, chronic (HCC)     Past Surgical History:  Procedure Laterality Date  . ABDOMINAL HYSTERECTOMY    . APPENDECTOMY    . CESAREAN SECTION    . CHOLECYSTECTOMY    . KNEE SURGERY     Family History:  Family History  Problem Relation Age of Onset  . Multiple sclerosis Mother    Family Psychiatric  History: See H&P  Social History:  History  Alcohol Use  . Yes    Comment: 2-500 ml boxes daily at least     History  Drug Use No    Social History   Social History  . Marital status: Single    Spouse name: N/A  . Number of children: N/A  . Years of education: N/A   Social History Main Topics  . Smoking status: Current Every Day Smoker    Packs/day: 1.00    Types: Cigarettes  . Smokeless tobacco: Never Used  . Alcohol use  Yes     Comment: 2-500 ml boxes daily at least  . Drug use: No  . Sexual activity: Yes    Birth control/ protection: Surgical   Other Topics Concern  . None   Social History Narrative  . None   Hospital Course: (Per admission notes): Patient is a 36 year old female who was transferred from [facility] ED for stabilization and treatment of increased drinking, being suicidal with a plan to cut her wrist with a razor blade. Patient is also enrolled in a methadone clinic, has been going to the clinic for several years. Patient states that she's been drinking for about a year now, got turned away from the clinic, and now has to go there every other day to get her methadone. Patient states that she's been feeling increasingly depressed for a year now, has been drinking excessively for a year and drinks 2-3 500 mL bottles daily. She adds that she also struggles with chronic pain, has fibromyalgia and rheumatoid arthritis. Patient reports that she knows her health is going downwards due to her alcohol use, as that she's unable to stop and that adds to her depression. She states that she's been clean in regards to opioids for some time now, has been taking methadone for 3-4 years. She reports that she lives with her husband and 31 year old son and knows that she needs to get help  so that she can be there for him. She does report that she's had several inpatient admissions in [place], 2 psychiatric admissions and one for rehabilitation. Patient states that sometimes she feels overwhelmed with her alcohol use, that she wants to end her life. She has that she tried a couple of weeks ago to cut her wrists but adds that her son walked in and so she stopped.  After the above admission assessment, Carla Park was admitted for detoxification/mood stabilization treatments. She received brief Ativan detox regimen. She was also medicated & discharged on; Lexapro 20 mg for depression, Gabapentin 300 mg for agitation/substance  withdrawal syndrome, Hydroxyzine 25 mg prn for anxiety, Nicotine patch 21 mg for smoking cessation & Trazodone 100 mg for insomnia. She was resumed on all her pertinent home medications for the other pre-existing medical issues presented. Carla Park tolerated her treatment regimen without any adverse effects or reactions reported. She was enrolled in the group counseling sessions being offered & held on this unit. She learned coping skills.  Carla Park is seen today by her attending psychiatrist. She stated that she is pleased that she sought help. Says she is tolerating her medications well. No withdrawal symptoms. No craving for substances. She is no longer feeling depressed. Describes normal energy and ability to think. Able to focus on task. No suicidal thoughts. No homicidal thoughts. No thoughts of violence. Patient reports normal biological functions.   The nursing staff reports that Carla Park has been appropriate on the unit. Patient has been interacting well with peers & staff. No behavioral issues. Patient has not voiced any suicidal thoughts. Patient has not been observed to be internally stimulated or pre-occupied. Patient has been adherent to her treatment recommendations. Patient has been tolerating her medication well, denies any adverse reactions or side effects.   Patient was discussed at the team meeting this morning. Team members feels that patient is back to her baseline level of function. Team agrees with plan to discharge patient today to continue mental health care on outpatient basis as noted below. Upon discharge, Carla Park adamantly denies any SIHI, AVH, delusional thoughts, paranoia or substance withdrawal symptoms. She will continue further psychiatric follow-up care/medication management on an outpatient basis as noted below. She was provided with all the necessary information needed to make this appointment without problems. Carla Park left [facility] with all personal belongings in no apparent distress.  Transportation per husband.  Physical Findings: AIMS: Facial and Oral Movements Muscles of Facial Expression: None, normal Lips and Perioral Area: None, normal Jaw: None, normal Tongue: None, normal,Extremity Movements Upper (arms, wrists, hands, fingers): None, normal Lower (legs, knees, ankles, toes): None, normal, Trunk Movements Neck, shoulders, hips: None, normal, Overall Severity Severity of abnormal movements (highest score from questions above): None, normal Incapacitation due to abnormal movements: None, normal Patient's awareness of abnormal movements (rate only patient's report): No Awareness, Dental Status Current problems with teeth and/or dentures?: No Does patient usually wear dentures?: No  CIWA:  CIWA-Ar Total: 0 COWS:  COWS Total Score: 1  Musculoskeletal: Strength & Muscle Tone: within normal limits Gait & Station: normal Patient leans: N/A  Psychiatric Specialty Exam: Physical Exam  Constitutional: She is oriented to person, place, and time. She appears well-developed.  HENT:  Head: Normocephalic.  Eyes: Pupils are equal, round, and reactive to light.  Neck: Normal range of motion.  Cardiovascular: Normal rate.   Respiratory: Effort normal.  GI: Soft.  Genitourinary:  Genitourinary Comments: Deferred  Musculoskeletal: Normal range of motion.  Neurological: She is alert  and oriented to person, place, and time.  Skin: Skin is warm and dry.    Review of Systems  Constitutional: Negative.   HENT: Negative.   Eyes: Negative.   Respiratory: Negative.   Cardiovascular: Negative.   Gastrointestinal: Negative.   Genitourinary: Negative.   Musculoskeletal: Negative.   Skin: Negative.   Neurological: Negative.   Endo/Heme/Allergies: Negative.   Psychiatric/Behavioral: Positive for depression (Stable). Negative for suicidal ideas.    Blood pressure 105/75, pulse 75, temperature 98.2 F (36.8 C), resp. rate 16, height 5\' 1"  (1.549 m), weight 52.2 kg (115  lb).Body mass index is 21.73 kg/m.  See Md's SRA   Have you used any form of tobacco in the last 30 days? (Cigarettes, Smokeless Tobacco, Cigars, and/or Pipes): Yes  Has this patient used any form of tobacco in the last 30 days? (Cigarettes, Smokeless Tobacco, Cigars, and/or Pipes):Yes, provided with Nicotine patch prescription to assist with smoking cessation.  Blood Alcohol level:  Lab Results  Component Value Date   ETH 149 (H) 01/13/2017   ETH <5 06/16/2015   Metabolic Disorder Labs:  No results found for: HGBA1C, MPG No results found for: PROLACTIN No results found for: CHOL, TRIG, HDL, CHOLHDL, VLDL, LDLCALC  See Psychiatric Specialty Exam and Suicide Risk Assessment completed by Attending Physician prior to discharge.  Discharge destination:  Home  Is patient on multiple antipsychotic therapies at discharge:  No   Has Patient had three or more failed trials of antipsychotic monotherapy by history:  No  Recommended Plan for Multiple Antipsychotic Therapies: NA  Allergies as of 01/28/2017      Reactions   Erythromycin Hives   Lamictal [lamotrigine] Dermatitis   Sumatriptan Other (See Comments)   Becomes angry      Medication List    STOP taking these medications   acetaminophen 325 MG tablet Commonly known as:  TYLENOL   amoxicillin-clavulanate 875-125 MG tablet Commonly known as:  AUGMENTIN   ondansetron 4 MG disintegrating tablet Commonly known as:  ZOFRAN ODT     TAKE these medications     Indication  escitalopram 20 MG tablet Commonly known as:  LEXAPRO Take 1 tablet (20 mg total) by mouth at bedtime. For depression  Indication:  Major Depressive Disorder   gabapentin 300 MG capsule Commonly known as:  NEURONTIN Take 1 capsule (300 mg total) by mouth 3 (three) times daily. For agitation  Indication:  Agitation, Alcohol Withdrawal Syndrome   hydrOXYzine 25 MG tablet Commonly known as:  ATARAX/VISTARIL Take 1 tablet (25 mg total) by mouth every 6  (six) hours as needed for anxiety.  Indication:  Anxiety Neurosis   lactulose 10 GM/15ML solution Commonly known as:  CHRONULAC Take 15 mLs (10 g total) by mouth 3 (three) times daily. (May purchase from over the counter): For constipation  Indication:  Chronic Constipation   methadone 10 MG/ML solution Commonly known as:  DOLOPHINE Take 16 mLs (160 mg total) by mouth daily. For opioid addiction What changed:  additional instructions  Indication:  Opioid Dependence   multivitamin with minerals Tabs tablet Take 1 tablet by mouth 3 (three) times daily. For low Vitamin What changed:  additional instructions  Indication:  Vitamin supplement   naproxen 250 MG tablet Commonly known as:  NAPROSYN Take 1 tablet (250 mg total) by mouth 2 (two) times daily with a meal. For moderate pain What changed:  additional instructions  Indication:  Moderate pain   nicotine 21 mg/24hr patch Commonly known as:  NICODERM CQ -  dosed in mg/24 hours Place 1 patch (21 mg total) onto the skin daily. For smoking cessation Start taking on:  01/29/2017  Indication:  Nicotine Addiction   pantoprazole 40 MG tablet Commonly known as:  PROTONIX Take 1 tablet (40 mg total) by mouth daily. For acid reflux Start taking on:  01/29/2017  Indication:  Gastroesophageal Reflux Disease   traZODone 100 MG tablet Commonly known as:  DESYREL Take 1 tablet (100 mg total) by mouth at bedtime. For sleep  Indication:  Trouble Sleeping      Follow-up Information    [facility] Follow up on 01/28/2017.   Specialty:  Behavioral Health Why:  Please walk in on Wednesday to be assessed for methadone maintenance program/SAIOP. Walk in hours: Mon, Wed, Fri 12:30-3:00PM. Thank you Contact information: [address] [place] [address] [phone]        [facility] Follow up.   Why:  Please resume services upon discharge. Arrive between 5am-10am daily for methadone  maintenance. Thank you.  Contact information: [address], [place] [address] Phone: [phone] Fax: [phone]         Follow-up recommendations: Activity:  As tolerated Diet: As recommended by your primary care doctor. Keep all scheduled follow-up appointments as recommended.   Comments: Patient is instructed prior to discharge to: Take all medications as prescribed by his/her mental healthcare provider. Report any adverse effects and or reactions from the medicines to his/her outpatient provider promptly. Patient has been instructed & cautioned: To not engage in alcohol and or illegal drug use while on prescription medicines. In the event of worsening symptoms, patient is instructed to call the crisis hotline, 911 and or go to the nearest ED for appropriate evaluation and treatment of symptoms. To follow-up with his/her primary care provider for your other medical issues, concerns and or health care needs.   Signed: Sanjuana Kava, NP, PMHNP, FNP-BC 01/28/2017, 10:04 AM

## 2017-01-28 NOTE — Tx Team (Signed)
Interdisciplinary Treatment and Diagnostic Plan Update  01/28/2017 Time of Session: [place] MRN: [id number]  Principal Diagnosis: Alcohol Use Disorder  Secondary Diagnoses: Principal Problem:   MDD (major depressive disorder), recurrent severe, without psychosis ([place]) Active Problems:   Chronic pain   Alcohol withdrawal ([place])   Long-term current use of methadone for opiate dependence ([place])   Current Medications:  Current Facility-Administered Medications  Medication Dose Route Frequency Provider Last Rate Last Dose  . alum & mag hydroxide-simeth (MAALOX/MYLANTA) 200-200-20 MG/5ML suspension 30 mL  30 mL Oral Q4H PRN Ethelene Hal, NP      . bisacodyl (DULCOLAX) suppository 10 mg  10 mg Rectal Daily PRN Artist Beach, MD   10 mg at 01/22/17 1212  . escitalopram (LEXAPRO) tablet 20 mg  20 mg Oral QHS Derrill Center, NP   20 mg at 01/27/17 2129  . feeding supplement (ENSURE ENLIVE) (ENSURE ENLIVE) liquid 237 mL  237 mL Oral BID BM Hampton Abbot, MD   237 mL at 01/26/17 0911  . gabapentin (NEURONTIN) capsule 300 mg  300 mg Oral TID Derrill Center, NP   300 mg at 01/28/17 0742  . hydrOXYzine (ATARAX/VISTARIL) tablet 25 mg  25 mg Oral Q6H PRN Ethelene Hal, NP   25 mg at 01/28/17 1013  . lactulose (CHRONULAC) 10 GM/15ML solution 10 g  10 g Oral TID Patriciaann Clan E, PA-C   10 g at 01/28/17 0741  . magnesium hydroxide (MILK OF MAGNESIA) suspension 30 mL  30 mL Oral Daily PRN Ethelene Hal, NP      . methadone (DOLOPHINE) tablet 160 mg  160 mg Oral Daily Lindell Spar I, NP   160 mg at 01/28/17 0743  . methocarbamol (ROBAXIN) tablet 500 mg  500 mg Oral Q6H PRN Laverle Hobby, PA-C   500 mg at 01/27/17 1609  . multivitamin with minerals tablet 1 tablet  1 tablet Oral Daily Ethelene Hal, NP   1 tablet at 01/28/17 9563  . naproxen (NAPROSYN) tablet 500 mg  500 mg Oral BID WC Patriciaann Clan E, PA-C   500 mg at 01/28/17 0741  . nicotine  (NICODERM CQ - dosed in mg/24 hours) patch 21 mg  21 mg Transdermal Daily Hampton Abbot, MD   21 mg at 01/28/17 0748  . pantoprazole (PROTONIX) EC tablet 40 mg  40 mg Oral Daily Laverle Hobby, PA-C   40 mg at 01/28/17 8756  . thiamine (VITAMIN B-1) tablet 100 mg  100 mg Oral Daily Ethelene Hal, NP   100 mg at 01/28/17 4332  . traZODone (DESYREL) tablet 100 mg  100 mg Oral QHS Derrill Center, NP   100 mg at 01/27/17 2129   PTA Medications: Prescriptions Prior to Admission  Medication Sig Dispense Refill Last Dose  . acetaminophen (TYLENOL) 325 MG tablet Take 650 mg by mouth every 6 (six) hours as needed for mild pain.   PRN at PRN  . amoxicillin-clavulanate (AUGMENTIN) 875-125 MG tablet Take 1 tablet by mouth every 12 (twelve) hours. (Patient not taking: Reported on 01/13/2017) 20 tablet 0 Not Taking at Unknown time  . ondansetron (ZOFRAN ODT) 4 MG disintegrating tablet Take 1 tablet (4 mg total) by mouth every 8 (eight) hours as needed for nausea or vomiting. (Patient not taking: Reported on 01/13/2017) 10 tablet 0 Not Taking at Unknown time  . [DISCONTINUED] methadone (DOLOPHINE) 10 MG/ML solution Take 160 mg by mouth daily.    01/12/2017 at  am  . [DISCONTINUED] Multiple Vitamin (MULTIVITAMIN WITH MINERALS) TABS tablet Take 1 tablet by mouth 3 (three) times daily. (Patient not taking: Reported on 01/13/2017) 30 tablet 0 Not Taking at Unknown time  . [DISCONTINUED] naproxen (NAPROSYN) 250 MG tablet Take 1 tablet (250 mg total) by mouth 2 (two) times daily with a meal. (Patient not taking: Reported on 01/13/2017) 30 tablet 0 Not Taking at Unknown time    Patient Stressors: Financial difficulties Health problems Legal issue Occupational concerns Substance abuse  Patient Strengths: Capable of independent living Wellsite geologist fund of knowledge Supportive family/friends Work skills  Treatment Modalities: Medication Management, Group therapy, Case management,  1 to 1  session with clinician, Psychoeducation, Recreational therapy.   Physician Treatment Plan for Primary Diagnosis: Alcohol Use Disorder  Medication Management: Evaluate patient's response, side effects, and tolerance of medication regimen.  Therapeutic Interventions: 1 to 1 sessions, Unit Group sessions and Medication administration.  Evaluation of Outcomes: Met  Physician Treatment Plan for Secondary Diagnosis: Principal Problem:   MDD (major depressive disorder), recurrent severe, without psychosis ([facility]) Active Problems:   Chronic pain   Alcohol withdrawal ([facility])   Long-term current use of methadone for opiate dependence ([facility])  Long Term Goal(s): Improvement in symptoms so as ready for discharge Improvement in symptoms so as ready for discharge   Short Term Goals: Ability to identify changes in lifestyle to reduce recurrence of condition will improve Ability to verbalize feelings will improve Ability to disclose and discuss suicidal ideas Ability to demonstrate self-control will improve Ability to identify and develop effective coping behaviors will improve Ability to maintain clinical measurements within normal limits will improve Ability to identify triggers associated with substance abuse/mental health issues will improve     Medication Management: Evaluate patient's response, side effects, and tolerance of medication regimen.  Therapeutic Interventions: 1 to 1 sessions, Unit Group sessions and Medication administration.  Evaluation of Outcomes: Met   RN Treatment Plan for Primary Diagnosis: Alcohol Use Disorder Long Term Goal(s): Knowledge of disease and therapeutic regimen to maintain health will improve  Short Term Goals: Ability to remain free from injury will improve, Ability to verbalize feelings will improve and Ability to disclose and discuss suicidal ideas  Medication Management: RN will administer medications as ordered by provider, will assess and evaluate  patient's response and provide education to patient for prescribed medication. RN will report any adverse and/or side effects to prescribing provider.  Therapeutic Interventions: 1 on 1 counseling sessions, Psychoeducation, Medication administration, Evaluate responses to treatment, Monitor vital signs and CBGs as ordered, Perform/monitor CIWA, COWS, AIMS and Fall Risk screenings as ordered, Perform wound care treatments as ordered.  Evaluation of Outcomes: Met   LCSW Treatment Plan for Primary Diagnosis: Alcohol Use Disorder Long Term Goal(s): Safe transition to appropriate next level of care at discharge, Engage patient in therapeutic group addressing interpersonal concerns.  Short Term Goals: Engage patient in aftercare planning with referrals and resources, Facilitate patient progression through stages of change regarding substance use diagnoses and concerns and Identify triggers associated with mental health/substance abuse issues  Therapeutic Interventions: Assess for all discharge needs, 1 to 1 time with Social worker, Explore available resources and support systems, Assess for adequacy in community support network, Educate family and significant other(s) on suicide prevention, Complete Psychosocial Assessment, Interpersonal group therapy.  Evaluation of Outcomes: Met   Progress in Treatment: Attending groups: Yes Participating in groups: Yes Taking medication as prescribed: Yes. Toleration medication: Yes. Family/Significant other contact made: SPE  completed with pt's husband.  Patient understands diagnosis: Yes. Discussing patient identified problems/goals with staff: Yes. Medical problems stabilized or resolved: Yes. Denies suicidal/homicidal ideation: Yes. Issues/concerns per patient self-inventory: No. Other: n/a  New problem(s) identified: No, Describe:  n/a  New Short Term/Long Term Goal(s): detox; elimination of SI thoughts; medication stabilization; development of  comprehensive mental wellness/sobriety plan.   Discharge Plan or Barriers: Pt declined at [facility]. Pt plans to return home; resume services with current methadone provdier. CSW assessing additional resources that may be helpful.  6/11: Pt plans to return home and resume services at [facility] until [facility] can assess her and get her into methadone program. [facility] pamhlet provided and pt met with peer counselor while in the hospital.   Reason for Continuation of Hospitalization: none  Estimated Length of Stay: discharge today   Attendees: Patient: 01/28/2017 10:48 AM  Physician: Dr. Shea Evans MD 01/28/2017 10:48 AM  Nursing: Samul Dada RN 01/28/2017 10:48 AM  RN Care Manager: Lars Pinks CM 01/28/2017 10:48 AM  Social Worker: Maxie Better, [place] 01/28/2017 10:48 AM  Recreational Therapist: x 01/28/2017 10:48 AM  Other: Lindell Spar NP 01/28/2017 10:48 AM  Other:  01/28/2017 10:48 AM  Other: 01/28/2017 10:48 AM    Scribe for Treatment Team: [place], LCSW 01/28/2017 10:48 AM

## 2017-01-28 NOTE — Progress Notes (Signed)
Patient verbalizes readiness for discharge. Follow up plan explained, AVS, transition record and SRA given along with prescriptions. All belongings returned. Patient verbalizes understanding. Denies SI/HI and assures this writer she will seek assistance should that change. Patient discharged ambulatory and in stable condition to husband.  

## 2017-01-28 NOTE — [facility] Suicide Risk Assessment (Signed)
[facility] Discharge Suicide Risk Assessment   Principal Problem: MDD (major depressive disorder), recurrent severe, without psychosis (HCC) Discharge Diagnoses:  Patient Active Problem List   Diagnosis Date Noted  . Alcohol use disorder, moderate, dependence (HCC) [F10.20] 01/28/2017  . MDD (major depressive disorder), recurrent severe, without psychosis (HCC) [F33.2] 01/14/2017  . 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 Alcohol withdrawal (HCC) [F10.239] 05/29/2015  . Chronic pain [G89.29]   . Fibromyalgia [M79.7]   . 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 Migraines [G43.909] 11/18/2012  . RA (rheumatoid arthritis) (HCC) [M06.9] 11/18/2012    Total Time spent with patient: 30 minutes  Musculoskeletal: Strength & Muscle Tone: within normal limits Gait & Station: normal Patient leans: Right  Psychiatric Specialty Exam: Review of Systems  Psychiatric/Behavioral: Positive for substance abuse. Negative for depression, hallucinations and suicidal ideas. The patient is nervous/anxious (improving).   All other systems reviewed and are negative.   Blood pressure 105/75, pulse 75, temperature 98.2 F (36.8 C), resp. rate 16, height 5\' 1"  (1.549 m), weight 52.2 kg (115 lb).Body mass index is 21.73 kg/m.  General Appearance: Fairly Groomed  ::  Fair  Speech:  Clear and Coherent409  Volume:  Normal  Mood:  anxious about going home since she does not want to relapse , but is coping  Affect:  Appropriate  Thought Process:  Goal Directed and Descriptions of Associations: Intact  Orientation:  Full (Time, Place, and Person)  Thought Content:  Logical  Suicidal Thoughts:  No  Homicidal Thoughts:  No  Memory:  Immediate;   Fair Recent;   Fair Remote;   Fair  Judgement:  Fair  Insight:  Fair  Psychomotor Activity:  Normal  Concentration:  Fair  Recall:  [phone] of Knowledge:Fair  Language: Fair  Akathisia:  No  Handed:  Right   AIMS (if indicated):     Assets:  Communication Skills Desire for Improvement  Sleep:  Number of Hours: 6.25  Cognition: WNL  ADL's:  Intact   Mental Status Per Nursing Assessment::   On Admission:  Suicidal ideation indicated by patient  Demographic Factors:  Caucasian  Loss Factors: NA  Historical Factors: Impulsivity  Risk Reduction Factors:   Positive social support and Positive therapeutic relationship  Continued Clinical Symptoms:  Alcohol/Substance Abuse/Dependencies Previous Psychiatric Diagnoses and Treatments  Cognitive Features That Contribute To Risk:  None    Suicide Risk:  Minimal: No identifiable suicidal ideation.  Patients presenting with no risk factors but with morbid ruminations; may be classified as minimal risk based on the severity of the depressive symptoms  Follow-up Information    [facility] Follow up on 01/28/2017.   Specialty:  Behavioral Health Why:  Please walk in on Wednesday to be assessed for methadone maintenance program/SAIOP. Walk in hours: Mon, Wed, Fri 12:30-3:00PM. Thank you Contact information: [address] [place] [phone]        [facility] Follow up.   Why:  Please resume services upon discharge. Arrive between 5am-10am daily for methadone maintenance. Thank you.  Contact information: [address] [place] Phone: [phone] Fax: [phone]          Plan Of Care/Follow-up recommendations:  Activity:  no restrictions Diet:  regular Tests:  as needed Other:  follow up with aftercare  Iver Fehrenbach, MD 01/28/2017, 11:23 AM

## 2017-01-28 NOTE — Progress Notes (Addendum)
D: Spoke with patient 1:1 who is up and visible in milieu. In dayroom interacting with peers. Patient's affect anxious, nervous with congruen mood. States she is nervous about discharge but "I'm proud of myself for the progress I've made. I just have to take it one step at a time." Rating depression at a 4/10, hopelessness at a 4/10 and anxiety at an 8/10. Rates sleep as good, appetite as fair, energy as normal and concentration as poor.  States goal for today is to "mentally prepare myself for home, think positive as best as possible." Chronic back pain at a 4/10, no other physical problems.   A: Medicated per orders, prn vistaril given for anxiety. Patient on scheduled naproxen for physical discomfort. Emotional support offered and self inventory reviewed. Encouraged completion of Suicide Safety Plan. Discussed POC with MD, SW. Reminded and praised patient for progress and growth. Reviewed effective coping skills patient has utilized.  R: Patient verbalizes understanding of POC. Will reassess effectiveness of vistaril. Has been helpful in the past. Patient denies SI/HI and remains safe on level III obs. Awaiting completion of discharge paperwork. Husband will transport home.

## 2017-01-28 NOTE — Progress Notes (Signed)
CSW faxed letter to [facility] [phone] per patient request, with pt's methadone dosage and admit/discharge dates.  Trula Slade, MSW, LCSW Clinical Social Worker 01/28/2017 4:23 PM

## 2017-02-06 ENCOUNTER — Encounter: Payer: Self-pay | Admitting: Neurology

## 2017-02-06 ENCOUNTER — Ambulatory Visit (INDEPENDENT_AMBULATORY_CARE_PROVIDER_SITE_OTHER): Payer: BLUE CROSS/BLUE SHIELD | Admitting: Neurology

## 2017-02-06 VITALS — BP 165/100 | HR 68 | Resp 18 | Ht 61.0 in | Wt 128.5 lb

## 2017-02-06 DIAGNOSIS — M2550 Pain in unspecified joint: Secondary | ICD-10-CM | POA: Insufficient documentation

## 2017-02-06 DIAGNOSIS — F1120 Opioid dependence, uncomplicated: Secondary | ICD-10-CM | POA: Diagnosis not present

## 2017-02-06 DIAGNOSIS — B182 Chronic viral hepatitis C: Secondary | ICD-10-CM

## 2017-02-06 DIAGNOSIS — R4789 Other speech disturbances: Secondary | ICD-10-CM | POA: Insufficient documentation

## 2017-02-06 DIAGNOSIS — Z820 Family history of epilepsy and other diseases of the nervous system: Secondary | ICD-10-CM | POA: Insufficient documentation

## 2017-02-06 DIAGNOSIS — R269 Unspecified abnormalities of gait and mobility: Secondary | ICD-10-CM

## 2017-02-06 DIAGNOSIS — R413 Other amnesia: Secondary | ICD-10-CM | POA: Insufficient documentation

## 2017-02-06 DIAGNOSIS — G8929 Other chronic pain: Secondary | ICD-10-CM

## 2017-02-06 DIAGNOSIS — F332 Major depressive disorder, recurrent severe without psychotic features: Secondary | ICD-10-CM | POA: Diagnosis not present

## 2017-02-06 NOTE — Progress Notes (Signed)
[facility]  PATIENT: Carla Park DOB: 09/09/1980  REFERRING DOCTOR OR PCP:  Rueben Bash, PA-C ([facility]        [place] Fax [phone]) SOURCE: patient, records from   _________________________________   HISTORICAL  CHIEF COMPLAINT:  Chief Complaint  Patient presents with  . Fatigue    [name] is here for eval of fatigue, difficulty with word finding, forgetfullness over the last yr. She is concerned b/c her mother has a hx. of MS. She has not had an MRI/fim  . Speech Disturbance    HISTORY OF PRESENT ILLNESS:  I had the pleasure seeing you patient, Carla Park, at [facility] for neurologic consultation regarding her right-sided clumsiness, speech disturbance and memory difficulties   She is a 36 year old woman who first noted that she was dropping items out of her right hand a year ago. She also noted some clumsiness with reduced handwriting and would sometimes have numbness in the right arm.   Her arms fatigue very quickly.    At times, she feels she drags her right foot and she feels her balance is off.  The right leg feels mildly weak at times. There is no spasticity. Sometimes, she notes jerking of her right arm but never with altered consciousness per.    She stumbles and has had a couple falls.  She also has had urinary urgency and had one episode of incontinence.   She has noted forgetfulness and word finding difficulty since earlier this year.   She finds that she sometimes cannot, with rightward and will sometimes have paraphasic errors. She has not noted any change in her ability to understand. Additionally she has noted some difficulty with short-term memory.  She feels it is worsening the past month.  She reports that earlier this year, when she began to notice some of her symptoms, she started to drink more as she was worried that she had multiple sclerosis just like her mother..    Last month, she went into  detoxification and felt her verbal fluency worsened much more afterwards.      She never noted issues with word finding or memory in the past.  She also has mild neck pain and notes that the right arm is sometimes numb and painful.        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     She has RA and has joint and muscle aches and pains and she reports that she started to use illegal drugs due to increased pain.     Her husband is also in recovery.    She has depression and anxiety.  She feels Lexapro has helped her some.    She saw a Psychiatrist last month in Detox and is scheduled to see another one in July.    She reports fatigue and insomnia.    Her fatigue is both physical and cognitive.    She has poor sleep with more difficulty falling asleep than staying asleep.    Trazodone has helped her some.  She has chronic Hepatitis C   Her mother (lives in [place]) has recently been diagnosed with MS a few years ago.      REVIEW OF SYSTEMS: Constitutional: No fevers, chills, sweats, or change in appetite.   Notes fatigue and insomnia Eyes: No visual changes, double vision, eye pain Ear, nose and throat: No hearing  loss, ear pain, nasal congestion, sore throat Cardiovascular: No chest pain, palpitations Respiratory: No shortness of breath at rest or with exertion.   No wheezes GastrointestinaI: Has chronic Hep C.   No nausea, vomiting, diarrhea, abdominal pain, fecal incontinence Genitourinary: No dysuria, urinary retention or frequency.  No nocturia. Musculoskeletal: Some neck pain and back pain and shoulder pain Integumentary: No rash, pruritus, skin lesions Neurological: as above Psychiatric: as above Endocrine: No palpitations, diaphoresis, change in appetite, change in weigh or increased thirst Hematologic/Lymphatic: No anemia, purpura, petechiae. Allergic/Immunologic: No itchy/runny eyes, nasal congestion,  recent allergic reactions, rashes  ALLERGIES: Allergies  Allergen Reactions  . Erythromycin Hives  . Lamictal [Lamotrigine] Dermatitis  . Sumatriptan Other (See Comments)    Becomes angry    HOME MEDICATIONS:  Current Outpatient Prescriptions:  .  escitalopram (LEXAPRO) 20 MG tablet, Take 1 tablet (20 mg total) by mouth at bedtime. For depression, Disp: 30 tablet, Rfl: 0 .  gabapentin (NEURONTIN) 300 MG capsule, Take 1 capsule (300 mg total) by mouth 3 (three) times daily. For agitation, Disp: 90 capsule, Rfl: 0 .  hydrOXYzine (ATARAX/VISTARIL) 25 MG tablet, Take 1 tablet (25 mg total) by mouth every 6 (six) hours as needed for anxiety., Disp: 60 tablet, Rfl: 0 .  methadone (DOLOPHINE) 10 MG/ML solution, Take 16 mLs (160 mg total) by mouth daily. For opioid addiction, Disp: , Rfl: 0 .  Multiple Vitamin (MULTIVITAMIN WITH MINERALS) TABS tablet, Take 1 tablet by mouth 3 (three) times daily. For low Vitamin, Disp: 1 tablet, Rfl: 0 .  traZODone (DESYREL) 100 MG tablet, Take 1 tablet (100 mg total) by mouth at bedtime. For sleep, Disp: 30 tablet, Rfl: 0  PAST MEDICAL HISTORY: Past Medical History:  Diagnosis Date  . Arthritis   . Chronic pain   . Fibromyalgia   . Hep C w/ coma, chronic (HCC)   . Hepatitis C   . Vision abnormalities     PAST SURGICAL HISTORY: Past Surgical History:  Procedure Laterality Date  . ABDOMINAL HYSTERECTOMY    . APPENDECTOMY    . CESAREAN SECTION    . CHOLECYSTECTOMY    . KNEE SURGERY      FAMILY HISTORY: Family History  Problem Relation Age of Onset  . Multiple sclerosis Mother   . Hypertension Father     SOCIAL HISTORY:  Social History   Social History  . Marital status: Single    Spouse name: N/A  . Number of children: N/A  . Years of education: N/A   Occupational History  . Not on file.   Social History Main Topics  . Smoking status: Current Every Day Smoker    Packs/day: 1.00    Types: Cigarettes  . Smokeless tobacco: Never  Used  . Alcohol use Yes     Comment: 2-500 ml boxes daily at least  . Drug use: No  . Sexual activity: Yes    Birth control/ protection: Surgical   Other Topics Concern  . Not on file   Social History Narrative  . No narrative on file     PHYSICAL EXAM  Vitals:   02/06/17 1120  BP: (!) 165/100  Pulse: 68  Resp: 18  Weight: 128 lb 8 oz (58.3 kg)  Height: 5\' 1"  (1.549 m)    Body mass index is 24.28 kg/m.   General: The patient is well-developed and well-nourished and in no acute distress  Eyes:  Funduscopic exam shows normal optic discs and retinal vessels.  Neck: The neck  is supple, no carotid bruits are noted.  The neck is nontender.  Cardiovascular: The heart has a regular rate and rhythm with a normal S1 and S2. There were no murmurs, gallops or rubs. Lungs are clear to auscultation.  Skin: Extremities are without significant edema.  Musculoskeletal:  Back is nontender  Neurologic Exam  Mental status: The patient is alert and oriented x 3 at the time of the examination. The patient has apparent normal recent and remote memory, with an apparently normal attention span and concentration ability.   Speech is normal.  Cranial nerves: Extraocular movements are full. Pupils are equal, round, and reactive to light and accomodation.  Visual fields are full.  Facial symmetry is present. She reports decreased left facial sensation to touch/temperature. Facial strength is normal.  Trapezius and sternocleidomastoid strength is normal. No dysarthria is noted.  The tongue is midline, and the patient has symmetric elevation of the soft palate. No obvious hearing deficits are noted.  Motor:  Muscle bulk is normal.   Tone is normal. Strength is  5 / 5 in all 4 extremities.   Sensory: She reports decreased vibratory sensation in the right toes vs left.   Sensory testing is intact to soft touch and temperature sensation in all 4 extremities.  Coordination: Cerebellar testing  reveals good finger-nose-finger and heel-to-shin bilaterally.  Gait and station: Station is normal.   Gait is normal. Tandem gait is mildly widel. Romberg is negative.   Reflexes: Deep tendon reflexes are symmetric and normal in arms.   DTRs are 3+ and symmetric in legs.   No ankle clonus.   Plantar responses are flexor.    DIAGNOSTIC DATA (LABS, IMAGING, TESTING) - I reviewed patient records, labs, notes, testing and imaging myself where available.  Lab Results  Component Value Date   WBC 5.3 01/13/2017   HGB 15.9 (H) 01/13/2017   HCT 47.1 (H) 01/13/2017   MCV 94.6 01/13/2017   PLT 162 01/13/2017      Component Value Date/Time   NA 140 01/23/2017 0604   K 4.0 01/23/2017 0604   CL 102 01/23/2017 0604   CO2 29 01/23/2017 0604   GLUCOSE 82 01/23/2017 0604   BUN 16 01/23/2017 0604   CREATININE 1.00 01/23/2017 0604   CALCIUM 9.3 01/23/2017 0604   PROT 6.6 01/23/2017 0604   ALBUMIN 3.7 01/23/2017 0604   AST 172 (H) 01/23/2017 0604   ALT 278 (H) 01/23/2017 0604   ALKPHOS 129 (H) 01/23/2017 0604   BILITOT 0.5 01/23/2017 0604   GFRNONAA >60 01/23/2017 0604   GFRAA >60 01/23/2017 0604       ASSESSMENT AND PLAN  Verbal fluency disorder  Multiple joint pain - Plan: ANA w/Reflex, Rheumatoid factor, Sedimentation rate  Gait disturbance - Plan: MR BRAIN W WO CONTRAST, MR CERVICAL SPINE W WO CONTRAST  Memory loss  Family history of MS (multiple sclerosis)  Hep C w/ coma, chronic (HCC)  Other chronic pain  MDD (major depressive disorder), recurrent severe, without psychosis (HCC)  Long-term current use of methadone for opiate dependence (HCC)   1.     She notes reduced balance and right sided clumsiness and mild reduced sensation over the past few months.  She has a family history of multiple sclerosis. She also has had some neck pain and right arm pain. We will check an MRI of the brain and cervical spine to determine if her symptoms could be due to multiple sclerosis  or to a myelopathy or other CNS  disorder. 2.    She has a history of rheumatoid arthritis but has never been on any disease modifying therapies. She continues to have chronic pain and joint pain. I will check some laboratory test to help determine if she might have a rheumatologic disorder. Based on results she may need referral to a rheumatologist. 3.     She will continue her medications. She will take gabapentin at bedtime to help her sleep. 4.     If the MRI is normal, her cognitive issues are more likely due to her depression and sleep issues. She is advised to follow-up with psychology with her appointment next month. 5.     She will return to see me in 2 months or sooner if there are new or worsening neurologic symptoms.   Joven Mom A. Epimenio Foot, MD, [facility] 02/06/2017, 11:53 AM Certified in Neurology, Clinical Neurophysiology, Sleep Medicine, Pain Medicine and Neuroimaging  [facility] [address], [address] [place], [place] [address] [phone]

## 2017-02-07 LAB — RHEUMATOID FACTOR: Rhuematoid fact SerPl-aCnc: 257.6 IU/mL — ABNORMAL HIGH (ref 0.0–13.9)

## 2017-02-07 LAB — ANA W/REFLEX: Anti Nuclear Antibody(ANA): NEGATIVE

## 2017-02-07 LAB — SEDIMENTATION RATE: Sed Rate: 8 mm/hr (ref 0–32)

## 2017-02-10 ENCOUNTER — Emergency Department (HOSPITAL_COMMUNITY)
Admission: EM | Admit: 2017-02-10 | Discharge: 2017-02-11 | Disposition: A | Discharge status: Home / Self Care | Payer: BLUE CROSS/BLUE SHIELD | Attending: Emergency Medicine | Admitting: Emergency Medicine

## 2017-02-10 ENCOUNTER — Encounter (HOSPITAL_COMMUNITY): Payer: Self-pay

## 2017-02-10 DIAGNOSIS — F1010 Alcohol abuse, uncomplicated: Secondary | ICD-10-CM | POA: Diagnosis not present

## 2017-02-10 DIAGNOSIS — R45851 Suicidal ideations: Secondary | ICD-10-CM | POA: Insufficient documentation

## 2017-02-10 DIAGNOSIS — F17210 Nicotine dependence, cigarettes, uncomplicated: Secondary | ICD-10-CM | POA: Insufficient documentation

## 2017-02-10 LAB — COMPREHENSIVE METABOLIC PANEL
ALT: 567 U/L — ABNORMAL HIGH (ref 14–54)
AST: 512 U/L — ABNORMAL HIGH (ref 15–41)
Albumin: 4 g/dL (ref 3.5–5.0)
Alkaline Phosphatase: 197 U/L — ABNORMAL HIGH (ref 38–126)
Anion gap: 10 (ref 5–15)
BUN: 15 mg/dL (ref 6–20)
CO2: 23 mmol/L (ref 22–32)
Calcium: 8.8 mg/dL — ABNORMAL LOW (ref 8.9–10.3)
Chloride: 101 mmol/L (ref 101–111)
Creatinine, Ser: 0.81 mg/dL (ref 0.44–1.00)
GFR calc Af Amer: >60 mL/min (ref >60)
GFR calc non Af Amer: >60 mL/min (ref >60)
Glucose, Bld: 66 mg/dL (ref 65–99)
Potassium: 4.1 mmol/L (ref 3.5–5.1)
Sodium: 134 mmol/L — ABNORMAL LOW (ref 135–145)
Total Bilirubin: 0.6 mg/dL (ref 0.3–1.2)
Total Protein: 7.4 g/dL (ref 6.5–8.1)

## 2017-02-10 LAB — CBC
HCT: 40.8 % (ref 36.0–46.0)
Hemoglobin: 14.1 g/dL (ref 12.0–15.0)
MCH: 31.4 pg (ref 26.0–34.0)
MCHC: 34.6 g/dL (ref 30.0–36.0)
MCV: 90.9 fL (ref 78.0–100.0)
Platelets: 142 10*3/uL — ABNORMAL LOW (ref 150–400)
RBC: 4.49 MIL/uL (ref 3.87–5.11)
RDW: 12.2 % (ref 11.5–15.5)
WBC: 6.9 10*3/uL (ref 4.0–10.5)

## 2017-02-10 LAB — RAPID URINE DRUG SCREEN, HOSP PERFORMED
Amphetamines: NOT DETECTED
Barbiturates: NOT DETECTED
Benzodiazepines: NOT DETECTED
Cocaine: NOT DETECTED
Opiates: NOT DETECTED
Tetrahydrocannabinol: NOT DETECTED

## 2017-02-10 LAB — ACETAMINOPHEN LEVEL: Acetaminophen (Tylenol), Serum: <10 ug/mL — ABNORMAL LOW (ref 10–30)

## 2017-02-10 LAB — I-STAT BETA HCG BLOOD, ED (MC, WL, AP ONLY): I-stat hCG, quantitative: <5 m[IU]/mL (ref <5)

## 2017-02-10 LAB — SALICYLATE LEVEL: Salicylate Lvl: <7 mg/dL (ref 2.8–30.0)

## 2017-02-10 LAB — ETHANOL: Alcohol, Ethyl (B): 167 mg/dL — ABNORMAL HIGH (ref <5)

## 2017-02-10 MED ORDER — VITAMIN B-1 100 MG PO TABS
100.0000 mg | ORAL_TABLET | Freq: Every day | ORAL | Status: DC
  Administered 2017-02-10 – 2017-02-11 (×2): 100 mg via ORAL
  Filled 2017-02-10 (×2): qty 1

## 2017-02-10 MED ORDER — LORAZEPAM 1 MG PO TABS
0.0000 mg | ORAL_TABLET | Freq: Four times a day (QID) | ORAL | Status: DC
  Administered 2017-02-10: 2 mg via ORAL
  Administered 2017-02-11: 1 mg via ORAL
  Filled 2017-02-10: qty 1
  Filled 2017-02-10: qty 2

## 2017-02-10 MED ORDER — LORAZEPAM 2 MG/ML IJ SOLN
0.0000 mg | Freq: Four times a day (QID) | INTRAMUSCULAR | Status: DC
Start: 2017-02-10 — End: 2017-02-11

## 2017-02-10 MED ORDER — THIAMINE HCL 100 MG/ML IJ SOLN: 100.0000 mg | Freq: Every day | INTRAMUSCULAR | Status: DC

## 2017-02-10 NOTE — ED Notes (Signed)
PT belongings inventoried and placed in locker #6. Personal belongings include 1 shirt, 1 tank top, 1 sweat pants, 2 shoes. Reviewed policies and procedures with pt. PT calm and cooperative.   While assessing pt she notified me that she was not completely honest in triage when talking about amount of alcohol consumption in front of husband. She reported to me she has been drinking about 2 bottles of wine/ day for a week and a half while husband is at work. She also reports she would have drank more, but it it doesn't take as much with the medication she has been on.

## 2017-02-10 NOTE — ED Notes (Addendum)
Pt wanded at security. Pts husband took all of pts belongings with him: cell phone and two rings and a nose ring.

## 2017-02-10 NOTE — ED Provider Notes (Signed)
[facility] DEPT Provider Note   CSN: [id number] Arrival date & time: 02/10/17  1509     History   Chief Complaint Chief Complaint  Patient presents with  . Suicidal  . Alcohol Problem    HPI Carla Park is a 36 y.o. female.  HPI Patient presents with depression and suicidal thoughts and alcohol abuse. Recently was inpatient at behavioral health. States she got out and around 3 days later began to drink and have the suicidal thoughts came back. Still has a plan to slit her wrists in the bathtub. Has had liver issues. States she's been told it is somewhat from alcohol but more from her hepatitis C. States she is [place] scheduled appointment to see gastroenterology but has not seen them recently. States she drinks about a liter of wine a day. States that she last drank at 8 AM this morning. Also is on methadone for her previous injection drug use.   Past Medical History:  Diagnosis Date  . Arthritis   . Chronic pain   . Fibromyalgia   . Hep C w/ coma, chronic (HCC)   . Hepatitis C   . Vision abnormalities     Patient Active Problem List   Diagnosis Date Noted  . Multiple joint pain 02/06/2017  . 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 Memory loss 02/06/2017  . Family history of MS (multiple sclerosis) 02/06/2017  . Alcohol use disorder, moderate, dependence (HCC) 01/28/2017  . MDD (major depressive disorder), recurrent severe, without psychosis (HCC) 01/14/2017  . 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 Alcohol withdrawal (HCC) 05/29/2015  . Chronic pain   . Fibromyalgia   . Hep C w/ coma, chronic (HCC)   . Migraines 11/18/2012  . RA (rheumatoid arthritis) (HCC) 11/18/2012    Past Surgical History:  Procedure Laterality Date  . ABDOMINAL HYSTERECTOMY    . APPENDECTOMY    . CESAREAN SECTION    . CHOLECYSTECTOMY    . KNEE SURGERY      OB History    No data available       Home Medications    Prior to Admission  medications   Medication Sig Start Date End Date Taking? Authorizing Provider  escitalopram (LEXAPRO) 20 MG tablet Take 1 tablet (20 mg total) by mouth at bedtime. For depression 01/28/17   Armandina Stammer I, NP  gabapentin (NEURONTIN) 300 MG capsule Take 1 capsule (300 mg total) by mouth 3 (three) times daily. For agitation 01/28/17   Armandina Stammer I, NP  hydrOXYzine (ATARAX/VISTARIL) 25 MG tablet Take 1 tablet (25 mg total) by mouth every 6 (six) hours as needed for anxiety. 01/28/17   Armandina Stammer I, NP  methadone (DOLOPHINE) 10 MG/ML solution Take 16 mLs (160 mg total) by mouth daily. For opioid addiction 01/28/17   Armandina Stammer I, NP  Multiple Vitamin (MULTIVITAMIN WITH MINERALS) TABS tablet Take 1 tablet by mouth 3 (three) times daily. For low Vitamin 01/28/17   Armandina Stammer I, NP  traZODone (DESYREL) 100 MG tablet Take 1 tablet (100 mg total) by mouth at bedtime. For sleep 01/28/17   Sanjuana Kava, NP    Family History Family History  Problem Relation Age of Onset  . Multiple sclerosis Mother   . Hypertension Father     Social History Social History  Substance Use Topics  . Smoking status: Current Every Day Smoker    Packs/day: 1.00    Types: Cigarettes  . Smokeless tobacco: Never Used  .  Alcohol use Yes     Comment: 2-500 ml boxes daily at least     Allergies   Erythromycin; Lamictal [lamotrigine]; and Sumatriptan   Review of Systems Review of Systems  Constitutional: Negative for appetite change.  HENT: Negative for congestion.   Respiratory: Negative for chest tightness and shortness of breath.   Gastrointestinal: Negative for abdominal distention and abdominal pain.  Genitourinary: Negative for enuresis.  Musculoskeletal: Negative for back pain.  Hematological: Negative for adenopathy.  Psychiatric/Behavioral: Positive for dysphoric mood and suicidal ideas. Negative for confusion.     Physical Exam Updated Vital Signs BP 125/83 (BP Location: Left Arm)   Pulse 70    Temp 98.3 F (36.8 C) (Oral)   Resp 20   Ht 5\' 3"  (1.6 m)   Wt 58.1 kg (128 lb)   SpO2 98%   BMI 22.67 kg/m   Physical Exam  Constitutional: She appears well-developed.  HENT:  Head: Atraumatic.  Neck: Neck supple.  Cardiovascular: Normal rate.   Pulmonary/Chest: Effort normal.  Abdominal: Soft. She exhibits no distension.  Musculoskeletal: She exhibits no edema.  Neurological: She is alert.  Skin: Skin is warm. Capillary refill takes less than 2 seconds.  Psychiatric:  Patient appears somewhat depressed     ED Treatments / Results  Labs (all labs ordered are listed, but only abnormal results are displayed) Labs Reviewed  COMPREHENSIVE METABOLIC PANEL - Abnormal; Notable for the following:       Result Value   Sodium 134 (*)    Calcium 8.8 (*)    AST 512 (*)    ALT 567 (*)    Alkaline Phosphatase 197 (*)    All other components within normal limits  ETHANOL - Abnormal; Notable for the following:    Alcohol, Ethyl (B) 167 (*)    All other components within normal limits  ACETAMINOPHEN LEVEL - Abnormal; Notable for the following:    Acetaminophen (Tylenol), Serum <10 (*)    All other components within normal limits  CBC - Abnormal; Notable for the following:    Platelets 142 (*)    All other components within normal limits  SALICYLATE LEVEL  RAPID URINE DRUG SCREEN, HOSP PERFORMED  I-STAT BETA HCG BLOOD, ED ([facility], [facility], [facility] ONLY)    EKG  EKG Interpretation None       Radiology No results found.  Procedures Procedures (including critical care time)  Medications Ordered in ED Medications  LORazepam (ATIVAN) injection 0-4 mg (not administered)    Or  LORazepam (ATIVAN) tablet 0-4 mg (not administered)  thiamine (VITAMIN B-1) tablet 100 mg (not administered)    Or  thiamine (B-1) injection 100 mg (not administered)     Initial Impression / Assessment and Plan / ED Course  I have reviewed the triage vital signs and the nursing notes.  Pertinent labs &  imaging results that were available during my care of the patient were reviewed by me and considered in my medical decision making (see chart for details).     Patient with depression and suicidal thoughts. Also alcohol abuse. Recently seen for same. Also has   chronic elevated liver enzymes. She is however awake and appropriate. Does not appear to have hepatic encephalopathy at this time. Still needs GI follow-up. Appears medically cleared. To be seen by TTS. Final Clinical Impressions(s) / ED Diagnoses   Final diagnoses:  Alcohol abuse  Suicidal ideation    New Prescriptions New Prescriptions   No medications on file  Benjiman Core, MD 02/10/17 [phone]

## 2017-02-10 NOTE — ED Triage Notes (Signed)
Pt reports depression and suicidal thoughts with plan to slit her wrists. She states recent attempt within 30 days. She reports alcohol addiction as well. Pts husband reports she told him today she was going to fill the bathtub with hot water and slit her wrists. Pt tearful in triage. Last drink was earlier this morning. She stated she has been sober for 20 days per pt due to discharge from behavorial health.

## 2017-02-10 NOTE — Progress Notes (Signed)
Patient has been referred to the following inpatient treatment facilities: [facility], [facility], [facility], [facility], [facility], [facility], [facility], [facility].  CSW in disposition will continue to seek placement.  Melbourne Abts, LCSWA Disposition staff 02/10/2017 10:47 PM
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
## 2017-02-10 NOTE — ED Notes (Signed)
Pt in wine scrubs in room with sitter. No personal belongings in room with pt and room secured.

## 2017-02-11 MED ORDER — METHADONE HCL 10 MG PO TABS
160.0000 mg | ORAL_TABLET | Freq: Every day | ORAL | Status: DC
  Administered 2017-02-11: 160 mg via ORAL
  Filled 2017-02-11 (×2): qty 16

## 2017-02-11 NOTE — Progress Notes (Signed)
CSW Spoke with Tobi Bastos, RN to discuss what time the pt will be leaving to [facility]. Tobi Bastos, RN stated she was unaware the pt had been accepted to [facility]. Pt to be transferred.  Princess Bruins, MSW, LCSWA CSW Disposition [phone]

## 2017-02-11 NOTE — Progress Notes (Signed)
Patient has been accepted to [facility].  Accepting physician is Dr. Roselyn Reef.  Call report to [phone].  Representative was [employer].  Patient can come after 7 a.m. Irvin Lizama K. Sherlon Handing, LPC-A, [facility]  Counselor 02/11/2017 1:47 AM

## 2017-02-11 NOTE — ED Provider Notes (Signed)
Pt accepted at [facility] by Dr. Roselyn Reef.  The pt is reexamined and is stable for transfer.   Jacalyn Lefevre, MD 02/11/17 1134

## 2017-02-11 NOTE — ED Notes (Signed)
Patient is sleeping  

## 2017-02-19 ENCOUNTER — Telehealth: Payer: Self-pay | Admitting: *Deleted

## 2017-02-19 NOTE — Telephone Encounter (Signed)
LMOM (identified vm) that per RAS, RF is elevated.  Will refer to rheumatology if she likes/fim

## 2017-02-19 NOTE — Telephone Encounter (Signed)
-----   Message from Asa Lente, MD sent at 02/17/2017  5:48 PM EDT ----- Please let her know that the rheumatoid factor was elevated. If she would like we can refer her to a rheumatologist for "joint pain and elevated RF"

## 2017-03-04 ENCOUNTER — Other Ambulatory Visit: Payer: BLUE CROSS/BLUE SHIELD

## 2017-03-20 ENCOUNTER — Telehealth: Payer: Self-pay | Admitting: Neurology

## 2017-03-20 NOTE — Telephone Encounter (Signed)
Patient called office in reference to wanting to reschedule her MRI's she didn't make it to in July due to being in hospital.  Please call

## 2017-03-24 ENCOUNTER — Telehealth: Payer: Self-pay | Admitting: Neurology

## 2017-03-24 DIAGNOSIS — R413 Other amnesia: Secondary | ICD-10-CM

## 2017-03-24 DIAGNOSIS — G3281 Cerebellar ataxia in diseases classified elsewhere: Secondary | ICD-10-CM

## 2017-03-24 NOTE — Telephone Encounter (Signed)
Orders entered

## 2017-03-24 NOTE — Telephone Encounter (Signed)
Patient is calling wanting to reschedule her MRI's that she no showed for in July but since she no showed for them it took the accession numbers. When you get a chance can you please put two new MRI orders in and I will be able to reschedule her with a new accession number.

## 2017-03-25 NOTE — Telephone Encounter (Signed)
Noted, thank you

## 2017-03-25 NOTE — Telephone Encounter (Signed)
I spoke to the patient and she is scheduled to have her MRI's at our [facility] mobile unit on 04/01/17.

## 2017-04-01 ENCOUNTER — Other Ambulatory Visit: Payer: BLUE CROSS/BLUE SHIELD

## 2017-04-15 ENCOUNTER — Ambulatory Visit: Payer: BLUE CROSS/BLUE SHIELD | Admitting: Neurology

## 2017-04-15 ENCOUNTER — Ambulatory Visit (INDEPENDENT_AMBULATORY_CARE_PROVIDER_SITE_OTHER): Payer: BLUE CROSS/BLUE SHIELD

## 2017-04-15 DIAGNOSIS — R413 Other amnesia: Secondary | ICD-10-CM

## 2017-04-15 DIAGNOSIS — G3281 Cerebellar ataxia in diseases classified elsewhere: Secondary | ICD-10-CM | POA: Diagnosis not present

## 2017-04-15 MED ORDER — GADOPENTETATE DIMEGLUMINE 469.01 MG/ML IV SOLN
13.0000 mL | Freq: Once | INTRAVENOUS | Status: AC | PRN
  Administered 2017-04-15: 13 mL via INTRAVENOUS

## 2017-05-14 ENCOUNTER — Ambulatory Visit: Payer: BLUE CROSS/BLUE SHIELD | Admitting: Neurology

## 2017-05-15 ENCOUNTER — Encounter: Payer: Self-pay | Admitting: Neurology

## 2017-05-18 ENCOUNTER — Telehealth: Payer: Self-pay | Admitting: Neurology

## 2017-05-18 DIAGNOSIS — M05741 Rheumatoid arthritis with rheumatoid factor of right hand without organ or systems involvement: Secondary | ICD-10-CM

## 2017-05-18 DIAGNOSIS — M05742 Rheumatoid arthritis with rheumatoid factor of left hand without organ or systems involvement: Principal | ICD-10-CM

## 2017-05-18 NOTE — Telephone Encounter (Signed)
Pt is wanting to be seen sooner 12/10. Please call to work her in if possible, she is wanting to discuss MRI results.

## 2017-05-18 NOTE — Telephone Encounter (Signed)
Spoke with pt. and explained will watch for sooner appt. (she had to [facility] 9/27 appt. due to sick child).  Reviewed MRI results with her. She would like referral to rheumatology for elevated rf, past dx. of RA.  Referral placed in EPIC/fim

## 2017-05-19 ENCOUNTER — Encounter: Payer: Self-pay | Admitting: Neurology

## 2017-05-19 ENCOUNTER — Ambulatory Visit (INDEPENDENT_AMBULATORY_CARE_PROVIDER_SITE_OTHER): Payer: BLUE CROSS/BLUE SHIELD | Admitting: Neurology

## 2017-05-19 VITALS — BP 122/81 | HR 67 | Resp 16 | Ht 63.0 in | Wt 145.5 lb

## 2017-05-19 DIAGNOSIS — M05742 Rheumatoid arthritis with rheumatoid factor of left hand without organ or systems involvement: Secondary | ICD-10-CM

## 2017-05-19 DIAGNOSIS — B1920 Unspecified viral hepatitis C without hepatic coma: Secondary | ICD-10-CM | POA: Diagnosis not present

## 2017-05-19 DIAGNOSIS — G8929 Other chronic pain: Secondary | ICD-10-CM

## 2017-05-19 DIAGNOSIS — M797 Fibromyalgia: Secondary | ICD-10-CM

## 2017-05-19 DIAGNOSIS — M2550 Pain in unspecified joint: Secondary | ICD-10-CM | POA: Diagnosis not present

## 2017-05-19 DIAGNOSIS — M05741 Rheumatoid arthritis with rheumatoid factor of right hand without organ or systems involvement: Secondary | ICD-10-CM | POA: Diagnosis not present

## 2017-05-19 DIAGNOSIS — M5412 Radiculopathy, cervical region: Secondary | ICD-10-CM | POA: Insufficient documentation

## 2017-05-19 DIAGNOSIS — F1120 Opioid dependence, uncomplicated: Secondary | ICD-10-CM | POA: Diagnosis not present

## 2017-05-19 MED ORDER — GABAPENTIN 600 MG PO TABS: 600.0000 mg | ORAL_TABLET | Freq: Three times a day (TID) | ORAL | 5 refills | Status: DC

## 2017-05-19 MED ORDER — ETODOLAC 400 MG PO TABS
400.0000 mg | ORAL_TABLET | Freq: Two times a day (BID) | ORAL | 5 refills | Status: DC
Start: 2017-05-19 — End: 2017-09-16

## 2017-05-19 NOTE — Progress Notes (Signed)
[facility]  PATIENT: Carla Park DOB: 05-25-1981  REFERRING DOCTOR OR PCP:  Rueben Bash, PA-C ([facility]        [place] Fax [phone]) SOURCE: patient, records from   _________________________________   HISTORICAL  CHIEF COMPLAINT:  Chief Complaint  Patient presents with  . Speech Disturbance    Here to discuss MRI results (was concerned about the possibility of MS--mother with hx. of MS).  Has been referred to rheumatology for elevated rf. Sts. is having difficulty with neck pain, bilat hand/arm pain, numbness (right worse than left).  Decreased grip, often drops objects/fim  . Fatigue    HISTORY OF PRESENT ILLNESS:  Carla Park Is a 36 year old woman with neck pain, right arm pain, hand pain and clumsiness.   Update 05/19/2017:  She was diagnosed with RA at age 58 but was never treated.    She has a lot of hand pain.   We checked labs and the RA was markedly elevated.    She has been referred to rheumatology for treatment.  Of note, she is Hep C positive and treatment is being discussed with her.     We discussed her MRI's.   At C5-C6, she does have significant degenerative changes to the right that could lead to right C6 nerve root compression. This is likely causing some of the pain that she is experiencing from the neck to the lower right arm.  We discussed an epidural steroid injection.  She feels her mild clumsiness in the hands is doing about the same. Gabapentin has helped her pain. Meloxicam has also slightly helped her pain.  Brain MRI is normal.     MRI cervical spine shows DJD mostly at C5C6 (agree with impression below)  IMPRESSION:  This MRI of the cervical spine with and without contrast shows the following: 1.    The spinal cord appears normal. 2.    At C5-C6, there are right greater than left disc osteophyte complexes causing moderately severe right and mild-to-moderate left foraminal narrowing and mild spinal stenosis.  There is possible right C6 nerve root compression. 3.    There are milder degenerative changes at C3-C4, C4-C5 and C7-T1 but did not lead to any nerve root compression. 4.    There is a normal enhancement pattern.  __________________________________ From 02/06/2017  She is a 36 year old woman who first noted that she was dropping items out of her right hand a year ago. She also noted some clumsiness with reduced handwriting and would sometimes have numbness in the right arm.   Her arms fatigue very quickly.    At times, she feels she drags her right foot and she feels her balance is off.  The right leg feels mildly weak at times. There is no spasticity. Sometimes, she notes jerking of her right arm but never with altered consciousness per.    She stumbles and has had a couple falls.  She also has had urinary urgency and had one episode of incontinence.   She has noted forgetfulness and word finding difficulty since earlier this year.   She finds that she sometimes cannot, with rightward and will sometimes have paraphasic errors. She has not noted any change in her ability to understand. Additionally she has noted some difficulty with short-term memory.  She feels it is worsening the past month.  She reports that earlier this year, when she began to notice some of her symptoms, she started to drink more as she was  worried that she had multiple sclerosis just like her mother..    Last month, she went into detoxification and felt her verbal fluency worsened much more afterwards.      She never noted issues with word finding or memory in the past.  She also has mild neck pain and notes that the right arm is sometimes numb and painful.        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     She has RA and has joint and muscle aches and pains and she reports that she started to use illegal drugs due to increased pain.     Her husband is also in  recovery.    She has depression and anxiety.  She feels Lexapro has helped her some.    She saw a Psychiatrist last month in [place] and is scheduled to see another one in July.    She reports fatigue and insomnia.    Her fatigue is both physical and cognitive.    She has poor sleep with more difficulty falling asleep than staying asleep.    Trazodone has helped her some.  She has chronic Hepatitis C   Her mother (lives in [place]) has recently been diagnosed with MS a few years ago.      REVIEW OF SYSTEMS: Constitutional: No fevers, chills, sweats, or change in appetite.   Notes fatigue and insomnia Eyes: No visual changes, double vision, eye pain Ear, nose and throat: No hearing loss, ear pain, nasal congestion, sore throat Cardiovascular: No chest pain, palpitations Respiratory: No shortness of breath at rest or with exertion.   No wheezes GastrointestinaI: Has chronic Hep C.   No nausea, vomiting, diarrhea, abdominal pain, fecal incontinence Genitourinary: No dysuria, urinary retention or frequency.  No nocturia. Musculoskeletal: Some neck pain and back pain and shoulder pain Integumentary: No rash, pruritus, skin lesions Neurological: as above Psychiatric: as above Endocrine: No palpitations, diaphoresis, change in appetite, change in weigh or increased thirst Hematologic/Lymphatic: No anemia, purpura, petechiae. Allergic/Immunologic: No itchy/runny eyes, nasal congestion, recent allergic reactions, rashes  ALLERGIES: Allergies  Allergen Reactions  . Erythromycin Hives  . Lamictal [Lamotrigine] Dermatitis  . Sumatriptan Other (See Comments)    Becomes angry    HOME MEDICATIONS:  Current Outpatient Prescriptions:  .  ARIPiprazole (ABILIFY) 10 MG tablet, Take 10 mg by mouth., Disp: , Rfl:  .  busPIRone (BUSPAR) 10 MG tablet, Take 10 mg by mouth., Disp: , Rfl:  .  escitalopram (LEXAPRO) 20 MG tablet, Take 1 tablet (20 mg total) by mouth at bedtime. For depression,  Disp: 30 tablet, Rfl: 0 .  hydrOXYzine (ATARAX/VISTARIL) 25 MG tablet, Take 1 tablet (25 mg total) by mouth every 6 (six) hours as needed for anxiety., Disp: 60 tablet, Rfl: 0 .  methadone (DOLOPHINE) 10 MG/ML solution, Take 16 mLs (160 mg total) by mouth daily. For opioid addiction, Disp: , Rfl: 0 .  Multiple Vitamin (MULTIVITAMIN WITH MINERALS) TABS tablet, Take 1 tablet by mouth 3 (three) times daily. For low Vitamin, Disp: 1 tablet, Rfl: 0 .  tizanidine (ZANAFLEX) 2 MG capsule, Take 2 mg by mouth., Disp: , Rfl:  .  traZODone (DESYREL) 100 MG tablet, Take 1 tablet (100 mg total) by mouth at bedtime. For sleep, Disp: 30 tablet, Rfl: 0 .  etodolac (LODINE) 400 MG tablet, Take 1 tablet (400 mg total) by mouth 2 (two) times daily.,  Disp: 60 tablet, Rfl: 5 .  gabapentin (NEURONTIN) 600 MG tablet, Take 1 tablet (600 mg total) by mouth 3 (three) times daily., Disp: 90 tablet, Rfl: 5  PAST MEDICAL HISTORY: Past Medical History:  Diagnosis Date  . Arthritis   . Chronic pain   . Fibromyalgia   . Hep C w/ coma, chronic (HCC)   . Hepatitis C   . Vision abnormalities     PAST SURGICAL HISTORY: Past Surgical History:  Procedure Laterality Date  . ABDOMINAL HYSTERECTOMY    . APPENDECTOMY    . CESAREAN SECTION    . CHOLECYSTECTOMY    . KNEE SURGERY      FAMILY HISTORY: Family History  Problem Relation Age of Onset  . Multiple sclerosis Mother   . Hypertension Father     SOCIAL HISTORY:  Social History   Social History  . Marital status: Married    Spouse name: N/A  . Number of children: N/A  . Years of education: N/A   Occupational History  . Not on file.   Social History Main Topics  . Smoking status: Current Every Day Smoker    Packs/day: 1.00    Types: Cigarettes  . Smokeless tobacco: Never Used  . Alcohol use Yes     Comment: 2-500 ml boxes daily at least  . Drug use: No  . Sexual activity: Yes    Birth control/ protection: Surgical   Other Topics Concern  . Not  on file   Social History Narrative  . No narrative on file     PHYSICAL EXAM  Vitals:   05/19/17 1300  BP: 122/81  Pulse: 67  Resp: 16  Weight: 145 lb 8 oz (66 kg)  Height: 5\' 3"  (1.6 m)    Body mass index is 25.77 kg/m.   General: The patient is well-developed and well-nourished and in no acute distress   Neck: The neck is supple, no carotid bruits are noted.  The neck is nontender.   Musculoskeletal/Extremity:  Back is nontender.  Her hand joints are warm and there is swelling of the PIPs in several fingers  Neurologic Exam  Mental status: The patient is alert and oriented x 3 at the time of the examination. The patient has apparent normal recent and remote memory, with an apparently normal attention span and concentration ability.   Speech is normal.  Cranial nerves: Extraocular movements are full. Facial strength and sensation is normal. Trapezius strength is normal.. No dysarthria is noted.  The tongue is midline, and the patient has symmetric elevation of the soft palate. No obvious hearing deficits are noted.  Motor:  Muscle bulk is normal.   Tone is normal. Strength is  5 / 5 in all 4 extremities.   Sensory: She reports decreased vibratory sensation in the right toes vs left.   Sensory testing is intact to soft touch and temperature sensation in all 4 extremities.  Coordination: Cerebellar testing reveals good finger-nose-finger and heel-to-shin bilaterally.  Gait and station: Station is normal.   Gait is normal. Tandem gait is mildly wide Romberg is negative.   Reflexes: Deep tendon reflexes are symmetric and normal in arms.   Deep tendon reflexes are mildly increased in the legs. There is no ankle clonus.    DIAGNOSTIC DATA (LABS, IMAGING, TESTING) - I reviewed patient records, labs, notes, testing and imaging myself where available.  Lab Results  Component Value Date   WBC 6.9 02/10/2017   HGB 14.1 02/10/2017   HCT 40.8  02/10/2017   MCV 90.9  02/10/2017   PLT 142 (L) 02/10/2017      Component Value Date/Time   NA 134 (L) 02/10/2017 1532   K 4.1 02/10/2017 1532   CL 101 02/10/2017 1532   CO2 23 02/10/2017 1532   GLUCOSE 66 02/10/2017 1532   BUN 15 02/10/2017 1532   CREATININE 0.81 02/10/2017 1532   CALCIUM 8.8 (L) 02/10/2017 1532   PROT 7.4 02/10/2017 1532   ALBUMIN 4.0 02/10/2017 1532   AST 512 (H) 02/10/2017 1532   ALT 567 (H) 02/10/2017 1532   ALKPHOS 197 (H) 02/10/2017 1532   BILITOT 0.6 02/10/2017 1532   GFRNONAA >60 02/10/2017 1532   GFRAA >60 02/10/2017 1532       ASSESSMENT AND PLAN  Multiple joint pain  Cervical radiculopathy at C6  Rheumatoid arthritis involving both hands with positive rheumatoid factor (HCC)  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    Increase gabapentin to 600 mg by mouth 3 times a day. Change meloxicam to etodolac 400 mg twice a day.  2.    If the change in medication does not help with the right arm pain enough, refer for a epidural steroid injection  (right C6 radic)   3.    She has been referred to rheumatology for probable rheumatoid arthritis. She is hep C positive and treatment decisions may require her to clear the hep C first. She is seeing GI for this issue.  4.   Return to see me in 6 months or sooner if there are new or worsening neurologic symptoms.   Richard A. Epimenio Foot, MD, [facility] 05/19/2017, 1:28 PM Certified in Neurology, Clinical Neurophysiology, Sleep Medicine, Pain Medicine and Neuroimaging  [facility] [address], [address] [place], [place] [address] [phone]

## 2017-07-27 ENCOUNTER — Ambulatory Visit: Payer: BLUE CROSS/BLUE SHIELD | Admitting: Neurology

## 2017-09-12 ENCOUNTER — Emergency Department (HOSPITAL_COMMUNITY)
Admission: EM | Admit: 2017-09-12 | Discharge: 2017-09-12 | Disposition: A | Discharge status: Other Acute Inpatient Hospital | Payer: BLUE CROSS/BLUE SHIELD | Attending: Emergency Medicine | Admitting: Emergency Medicine

## 2017-09-12 ENCOUNTER — Other Ambulatory Visit: Payer: Self-pay

## 2017-09-12 ENCOUNTER — Inpatient Hospital Stay (HOSPITAL_COMMUNITY)
Admission: AD | Admit: 2017-09-12 | Discharge: 2017-09-16 | DRG: 881 | Disposition: A | Discharge status: Home / Self Care | Payer: BLUE CROSS/BLUE SHIELD | Source: Intra-hospital | Attending: Psychiatry | Admitting: Psychiatry

## 2017-09-12 ENCOUNTER — Encounter (HOSPITAL_COMMUNITY): Payer: Self-pay | Admitting: Emergency Medicine

## 2017-09-12 ENCOUNTER — Encounter (HOSPITAL_COMMUNITY): Payer: Self-pay

## 2017-09-12 DIAGNOSIS — F329 Major depressive disorder, single episode, unspecified: Secondary | ICD-10-CM | POA: Diagnosis present

## 2017-09-12 DIAGNOSIS — F17210 Nicotine dependence, cigarettes, uncomplicated: Secondary | ICD-10-CM | POA: Diagnosis present

## 2017-09-12 DIAGNOSIS — F1120 Opioid dependence, uncomplicated: Secondary | ICD-10-CM | POA: Diagnosis present

## 2017-09-12 DIAGNOSIS — Z630 Problems in relationship with spouse or partner: Secondary | ICD-10-CM | POA: Diagnosis not present

## 2017-09-12 DIAGNOSIS — F1020 Alcohol dependence, uncomplicated: Secondary | ICD-10-CM | POA: Diagnosis present

## 2017-09-12 DIAGNOSIS — R45851 Suicidal ideations: Secondary | ICD-10-CM | POA: Insufficient documentation

## 2017-09-12 DIAGNOSIS — F332 Major depressive disorder, recurrent severe without psychotic features: Secondary | ICD-10-CM | POA: Diagnosis not present

## 2017-09-12 DIAGNOSIS — Z818 Family history of other mental and behavioral disorders: Secondary | ICD-10-CM | POA: Diagnosis not present

## 2017-09-12 DIAGNOSIS — Z811 Family history of alcohol abuse and dependence: Secondary | ICD-10-CM | POA: Diagnosis not present

## 2017-09-12 DIAGNOSIS — R110 Nausea: Secondary | ICD-10-CM | POA: Diagnosis not present

## 2017-09-12 DIAGNOSIS — G4700 Insomnia, unspecified: Secondary | ICD-10-CM | POA: Diagnosis not present

## 2017-09-12 DIAGNOSIS — F419 Anxiety disorder, unspecified: Secondary | ICD-10-CM | POA: Diagnosis not present

## 2017-09-12 DIAGNOSIS — Z90710 Acquired absence of both cervix and uterus: Secondary | ICD-10-CM | POA: Diagnosis not present

## 2017-09-12 DIAGNOSIS — R451 Restlessness and agitation: Secondary | ICD-10-CM | POA: Diagnosis not present

## 2017-09-12 DIAGNOSIS — F1010 Alcohol abuse, uncomplicated: Secondary | ICD-10-CM | POA: Diagnosis not present

## 2017-09-12 DIAGNOSIS — Z79899 Other long term (current) drug therapy: Secondary | ICD-10-CM | POA: Insufficient documentation

## 2017-09-12 DIAGNOSIS — Z23 Encounter for immunization: Secondary | ICD-10-CM | POA: Diagnosis not present

## 2017-09-12 DIAGNOSIS — Z915 Personal history of self-harm: Secondary | ICD-10-CM | POA: Diagnosis not present

## 2017-09-12 HISTORY — DX: Opioid abuse, uncomplicated: F11.10

## 2017-09-12 HISTORY — DX: Depression, unspecified: F32.A

## 2017-09-12 HISTORY — DX: Alcohol abuse, uncomplicated: F10.10

## 2017-09-12 HISTORY — DX: Suicide attempt, initial encounter: T14.91XA

## 2017-09-12 HISTORY — DX: Major depressive disorder, single episode, unspecified: F32.9

## 2017-09-12 LAB — SALICYLATE LEVEL: Salicylate Lvl: <7 mg/dL (ref 2.8–30.0)

## 2017-09-12 LAB — COMPREHENSIVE METABOLIC PANEL
ALT: 183 U/L — ABNORMAL HIGH (ref 14–54)
AST: 169 U/L — ABNORMAL HIGH (ref 15–41)
Albumin: 3.8 g/dL (ref 3.5–5.0)
Alkaline Phosphatase: 213 U/L — ABNORMAL HIGH (ref 38–126)
Anion gap: 16 — ABNORMAL HIGH (ref 5–15)
BUN: 9 mg/dL (ref 6–20)
CO2: 23 mmol/L (ref 22–32)
Calcium: 8.9 mg/dL (ref 8.9–10.3)
Chloride: 98 mmol/L — ABNORMAL LOW (ref 101–111)
Creatinine, Ser: 0.74 mg/dL (ref 0.44–1.00)
GFR calc Af Amer: >60 mL/min (ref >60)
GFR calc non Af Amer: >60 mL/min (ref >60)
Glucose, Bld: 89 mg/dL (ref 65–99)
Potassium: 4 mmol/L (ref 3.5–5.1)
Sodium: 137 mmol/L (ref 135–145)
Total Bilirubin: 0.5 mg/dL (ref 0.3–1.2)
Total Protein: 7.1 g/dL (ref 6.5–8.1)

## 2017-09-12 LAB — RAPID URINE DRUG SCREEN, HOSP PERFORMED
Amphetamines: NOT DETECTED
Barbiturates: NOT DETECTED
Benzodiazepines: NOT DETECTED
Cocaine: NOT DETECTED
Opiates: NOT DETECTED
Tetrahydrocannabinol: NOT DETECTED

## 2017-09-12 LAB — CBC
HCT: 45.6 % (ref 36.0–46.0)
Hemoglobin: 15.7 g/dL — ABNORMAL HIGH (ref 12.0–15.0)
MCH: 31.2 pg (ref 26.0–34.0)
MCHC: 34.4 g/dL (ref 30.0–36.0)
MCV: 90.5 fL (ref 78.0–100.0)
Platelets: 141 10*3/uL — ABNORMAL LOW (ref 150–400)
RBC: 5.04 MIL/uL (ref 3.87–5.11)
RDW: 14.5 % (ref 11.5–15.5)
WBC: 7.3 10*3/uL (ref 4.0–10.5)

## 2017-09-12 LAB — ACETAMINOPHEN LEVEL: Acetaminophen (Tylenol), Serum: <10 ug/mL — ABNORMAL LOW (ref 10–30)

## 2017-09-12 LAB — ETHANOL: Alcohol, Ethyl (B): 152 mg/dL — ABNORMAL HIGH (ref <10)

## 2017-09-12 LAB — I-STAT BETA HCG BLOOD, ED (MC, WL, AP ONLY): I-stat hCG, quantitative: <5 m[IU]/mL (ref <5)

## 2017-09-12 MED ORDER — VITAMIN B-1 100 MG PO TABS
100.0000 mg | ORAL_TABLET | Freq: Every day | ORAL | Status: DC
  Administered 2017-09-12: 100 mg via ORAL
  Filled 2017-09-12: qty 1

## 2017-09-12 MED ORDER — LORAZEPAM 2 MG/ML IJ SOLN: 0.0000 mg | Freq: Four times a day (QID) | INTRAMUSCULAR | Status: DC

## 2017-09-12 MED ORDER — VITAMIN B-1 100 MG PO TABS
100.0000 mg | ORAL_TABLET | Freq: Every day | ORAL | Status: DC
  Administered 2017-09-13: 100 mg via ORAL
  Filled 2017-09-12 (×2): qty 1

## 2017-09-12 MED ORDER — LORAZEPAM 2 MG/ML IJ SOLN: 0.0000 mg | Freq: Two times a day (BID) | INTRAMUSCULAR | Status: DC

## 2017-09-12 MED ORDER — ALUM & MAG HYDROXIDE-SIMETH 200-200-20 MG/5ML PO SUSP
30.0000 mL | ORAL | Status: DC | PRN
Start: 2017-09-12 — End: 2017-09-16

## 2017-09-12 MED ORDER — LORAZEPAM 1 MG PO TABS: 1.0000 mg | ORAL_TABLET | Freq: Every day | ORAL | Status: DC

## 2017-09-12 MED ORDER — LORAZEPAM 1 MG PO TABS: 1.0000 mg | ORAL_TABLET | Freq: Two times a day (BID) | ORAL | Status: DC

## 2017-09-12 MED ORDER — ZOLPIDEM TARTRATE 5 MG PO TABS: 5.0000 mg | ORAL_TABLET | Freq: Every evening | ORAL | Status: DC | PRN

## 2017-09-12 MED ORDER — LORAZEPAM 1 MG PO TABS
1.0000 mg | ORAL_TABLET | Freq: Four times a day (QID) | ORAL | Status: DC
  Administered 2017-09-12 – 2017-09-13 (×3): 1 mg via ORAL
  Filled 2017-09-12 (×3): qty 1

## 2017-09-12 MED ORDER — ONDANSETRON HCL 4 MG PO TABS
4.0000 mg | ORAL_TABLET | Freq: Three times a day (TID) | ORAL | Status: DC | PRN
  Administered 2017-09-12: 4 mg via ORAL
  Filled 2017-09-12: qty 1

## 2017-09-12 MED ORDER — ACETAMINOPHEN 325 MG PO TABS: 650.0000 mg | ORAL_TABLET | ORAL | Status: DC | PRN

## 2017-09-12 MED ORDER — ONDANSETRON 4 MG PO TBDP: 4.0000 mg | ORAL_TABLET | Freq: Four times a day (QID) | ORAL | Status: DC | PRN

## 2017-09-12 MED ORDER — MAGNESIUM HYDROXIDE 400 MG/5ML PO SUSP: 30.0000 mL | Freq: Every day | ORAL | Status: DC | PRN

## 2017-09-12 MED ORDER — HYDROXYZINE HCL 25 MG PO TABS
25.0000 mg | ORAL_TABLET | Freq: Four times a day (QID) | ORAL | Status: DC | PRN
  Administered 2017-09-12: 25 mg via ORAL
  Filled 2017-09-12: qty 1

## 2017-09-12 MED ORDER — ALUM & MAG HYDROXIDE-SIMETH 200-200-20 MG/5ML PO SUSP: 30.0000 mL | Freq: Four times a day (QID) | ORAL | Status: DC | PRN

## 2017-09-12 MED ORDER — LORAZEPAM 1 MG PO TABS: 1.0000 mg | ORAL_TABLET | Freq: Three times a day (TID) | ORAL | Status: DC

## 2017-09-12 MED ORDER — LOPERAMIDE HCL 2 MG PO CAPS: 2.0000 mg | ORAL_CAPSULE | ORAL | Status: DC | PRN

## 2017-09-12 MED ORDER — ADULT MULTIVITAMIN W/MINERALS CH
1.0000 | ORAL_TABLET | Freq: Every day | ORAL | Status: DC
  Administered 2017-09-12 – 2017-09-13 (×2): 1 via ORAL
  Filled 2017-09-12 (×4): qty 1

## 2017-09-12 MED ORDER — THIAMINE HCL 100 MG/ML IJ SOLN: 100.0000 mg | Freq: Once | INTRAMUSCULAR | Status: DC

## 2017-09-12 MED ORDER — LORAZEPAM 1 MG PO TABS
0.0000 mg | ORAL_TABLET | Freq: Four times a day (QID) | ORAL | Status: DC
  Administered 2017-09-12: 1 mg via ORAL
  Filled 2017-09-12: qty 1

## 2017-09-12 MED ORDER — ACETAMINOPHEN 325 MG PO TABS
650.0000 mg | ORAL_TABLET | Freq: Four times a day (QID) | ORAL | Status: DC | PRN
  Administered 2017-09-14 (×2): 650 mg via ORAL
  Filled 2017-09-12 (×2): qty 2

## 2017-09-12 MED ORDER — LORAZEPAM 1 MG PO TABS
1.0000 mg | ORAL_TABLET | Freq: Four times a day (QID) | ORAL | Status: DC | PRN
  Administered 2017-09-12: 1 mg via ORAL
  Filled 2017-09-12: qty 1

## 2017-09-12 MED ORDER — HYDROXYZINE HCL 25 MG PO TABS: 25.0000 mg | ORAL_TABLET | Freq: Three times a day (TID) | ORAL | Status: DC | PRN

## 2017-09-12 MED ORDER — PNEUMOCOCCAL VAC POLYVALENT 25 MCG/0.5ML IJ INJ
0.5000 mL | INJECTION | INTRAMUSCULAR | Status: AC
  Administered 2017-09-14: 0.5 mL via INTRAMUSCULAR

## 2017-09-12 MED ORDER — NICOTINE 21 MG/24HR TD PT24
21.0000 mg | MEDICATED_PATCH | Freq: Every day | TRANSDERMAL | Status: DC
  Administered 2017-09-13 – 2017-09-16 (×4): 21 mg via TRANSDERMAL
  Filled 2017-09-12 (×6): qty 1

## 2017-09-12 MED ORDER — TRAZODONE HCL 50 MG PO TABS
50.0000 mg | ORAL_TABLET | Freq: Every evening | ORAL | Status: DC | PRN
  Administered 2017-09-12 – 2017-09-13 (×2): 50 mg via ORAL
  Filled 2017-09-12 (×2): qty 1

## 2017-09-12 MED ORDER — THIAMINE HCL 100 MG/ML IJ SOLN: 100.0000 mg | Freq: Every day | INTRAMUSCULAR | Status: DC

## 2017-09-12 MED ORDER — LORAZEPAM 1 MG PO TABS: 0.0000 mg | ORAL_TABLET | Freq: Two times a day (BID) | ORAL | Status: DC

## 2017-09-12 MED ORDER — NICOTINE 21 MG/24HR TD PT24
21.0000 mg | MEDICATED_PATCH | Freq: Every day | TRANSDERMAL | Status: DC
  Administered 2017-09-12: 21 mg via TRANSDERMAL
  Filled 2017-09-12: qty 1

## 2017-09-12 NOTE — ED Notes (Signed)
Pt states last dose of Methadone 170mg  was this am at [facility]. States she has been on this dose x 3-4 years. Pt aware last dose in Epic is 160mg  and that RN attempted to contact Clinic to verify med and dosage - no answer d/t closed. Pt also aware has been accepted to [facility] this afternoon and that until dose is verified she may receive 160mg  in am. Voiced agreement and understanding w/tx plan.

## 2017-09-12 NOTE — ED Notes (Signed)
Pt ambulatory to F11 - wearing burgundy paper scrubs. Pt's belongings found at [employer] A nurses' desk - 1 labeled belongings bag - RN to inventory. Sitter w/pt. TTS being performed at this time.

## 2017-09-12 NOTE — ED Notes (Signed)
Pt wanded by security. 

## 2017-09-12 NOTE — Progress Notes (Signed)
D.  Pt pleasant but anxious on approach, experiencing withdrawal at this time.  Pt did attend evening AA group, observed engaged in appropriate interaction with peers on the unit.  Pt denies HI/AVH but does continue to have passive SI.  Pt will contract for safety on the unit.  A.  Support and encouragement offered, medication given as ordered  R.  Pt remains safe on the unit,wll continue to monitor.

## 2017-09-12 NOTE — Progress Notes (Signed)
Pt attended evening AA group. 

## 2017-09-12 NOTE — Progress Notes (Signed)
Per Kenney Houseman, pt has been accepted to [facility] bed 303-01. Accepting provider is Inetta Fermo NP, Attending provider is Dr. Jama Flavors MD. Patient can arrive by 4:00PM Number for report is [phone].   Trula Slade, MSW, LCSW Clinical Social Worker 09/12/2017 12:44 PM

## 2017-09-12 NOTE — BH Assessment (Addendum)
Tele Assessment Note   Patient Name: Carla Park MRN: [id number] Referring Physician: Wynetta Emery, PA-C Location of Patient:  Location of Provider: Behavioral Health TTS Department  Carla Park is an 37 y.o. female who presents voluntarily reporting symptoms of depression and suicidal ideation with a plan to cut her wrist and relapsing on alcohol 1 mo ago. She states that her husband told her 1 mo ago that he was not happy anymore, he wanted to work on himself and his credit, and that he found a place. "He was my best friend for the past 10 years, and now I don't have anyone--my family is supportive, but no one lives here". Pt states that she stopped taking her medications (Lexapro, Buspar, Abilify, Trazodone) when she relapsed, but has remained compliant with her methadone program. Pt has a history of opiate abuse, alcohol abuse and depression. Past attempts include an attempt to cut her wrist last year, and an admission to [facility].  Pt acknowledges symptoms including: trouble sleeping, crying, feeling like it is better if she is not around. PT denies homicidal ideation/ history of violence. Pt denies auditory or visual hallucinations or other psychotic symptoms.  Pt lives with her husband and 30 yo son. Pt denies history of abuse and trauma.  Pt works at [employer]. Pt has poor insight and judgment. Pt's memory is typical. Pt denies legal history. ? Pt's OP history includes treatment in the Methadone clinic at [facility]. IP history includes previous admissions at [facility], [place], [address] [place]. Last admission was last year at [facility]. ? MSE: Pt is casually dressed, alert, oriented x4 with normal speech and normal motor behavior. Eye contact is good. Pt's mood is depressed and affect is depressed and tearful. Affect is congruent with mood. Thought process is coherent and relevant. There is no indication Pt is currently responding to internal stimuli or  experiencing delusional thought content. Pt was cooperative throughout assessment. Pt is currently unable to contract for safety outside the hospital and wants inpatient psychiatric treatment.  Carla Fermo, FNP recommends IP treatment. Per Carla Park, pt is accepted to [facility]. AC will coordinate transfer.     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 Arthritis   . Chronic pain   . Fibromyalgia   . Hep C w/ coma, chronic (HCC)   . Hepatitis C   . Vision abnormalities     Past Surgical History:  Procedure Laterality Date  . ABDOMINAL HYSTERECTOMY    . APPENDECTOMY    . CESAREAN SECTION    . CHOLECYSTECTOMY    . KNEE SURGERY      Family History:  Family History  Problem Relation Age of Onset  . Multiple sclerosis Mother   . Hypertension Father     Social History:  reports that she has been smoking cigarettes.  She has been smoking about 1.00 pack per day. she has never used smokeless tobacco. She reports that she drinks alcohol. She reports that she does not use drugs.  Additional Social History:  Alcohol / Drug Use Pain Medications: methadone maintenance--[facility] Prescriptions: denies Over the Counter: denies History of alcohol / drug use?: Yes Substance #1 Name of Substance 1: alcohol 1 - Age of First Use: 8 1 - Amount (size/oz): 3 or 4 of boxes of wine 1 - Frequency: daily 1 - Duration: past  1 month 1 - Last Use / Amount: today -1 glass Substance #2 Name of Substance 2: opiates 2 - Age of First Use: 37 yr old 2 - Amount (size/oz): unknown 2 - Frequency: daily  2 - Duration: years 2 - Last Use / Amount: currently at a methadone clinic  CIWA: CIWA-Ar BP: 137/89 Pulse Rate: 72 COWS:    Allergies:  Allergies  Allergen Reactions  . Erythromycin Hives  . Lamictal [Lamotrigine] Dermatitis  . Sumatriptan Other  (See Comments)    Becomes angry    Home Medications:  (Not in a hospital admission)  OB/GYN Status:  No LMP recorded. Patient has had a hysterectomy.  General Assessment Data Location of Assessment: [facility] TTS Assessment: In system Is this a Tele or Face-to-Face Assessment?: Tele Assessment Is this an Initial Assessment or a Re-assessment for this encounter?: Initial Assessment Marital status: Separated Is patient pregnant?: No Pregnancy Status: No Living Arrangements: Spouse/significant other, Children Can pt return to current living arrangement?: Yes Admission Status: Voluntary Is patient capable of signing voluntary admission?: Yes Referral Source: Self/Family/Friend Insurance type: BCBS     Crisis Care Plan Living Arrangements: Spouse/significant other, Children Name of Psychiatrist: [facility] Name of Therapist: Elroy Park  Education Status Is patient currently in school?: No Highest grade of school patient has completed: some college  Risk to self with the past 6 months Suicidal Ideation: Yes-Currently Present Has patient been a risk to self within the past 6 months prior to admission? : No Suicidal Intent: No Has patient had any suicidal intent within the past 6 months prior to admission? : No Is patient at risk for suicide?: Yes Suicidal Plan?: Yes-Currently Present Has patient had any suicidal plan within the past 6 months prior to admission? : Yes Specify Current Suicidal Plan: razor blade to wrist Access to Means: Yes Specify Access to Suicidal Means: razor What has been your use of drugs/alcohol within the last 12 months?: see Sa section Previous Attempts/Gestures: Yes How many times?: 1 Other Self Harm Risks: (none known) Triggers for Past Attempts: (SA) Intentional Self Injurious Behavior: None Family Suicide History: No Recent stressful life event(s): (separation) Persecutory voices/beliefs?: No Depression: Yes Depression Symptoms:  Insomnia, Isolating, Fatigue, Loss of interest in usual pleasures, Feeling worthless/self pity Substance abuse history and/or treatment for substance abuse?: Yes Suicide prevention information given to non-admitted patients: Not applicable  Risk to Others within the past 6 months Homicidal Ideation: No Does patient have any lifetime risk of violence toward others beyond the six months prior to admission? : No Thoughts of Harm to Others: No Current Homicidal Intent: No Current Homicidal Plan: No Access to Homicidal Means: No History of harm to others?: No Assessment of Violence: None Noted Does patient have access to weapons?: No Criminal Charges Pending?: No Does patient have a court date: No Is patient on probation?: No  Psychosis Hallucinations: None noted Delusions: None noted  Mental Status Report Appearance/Hygiene: In scrubs Eye Contact: Fair Motor Activity: Unremarkable Speech: Logical/coherent Level of Consciousness: Alert, Crying Mood: Depressed, Anxious Affect: Depressed, Anxious Anxiety Level: Moderate Thought Processes: Coherent, Relevant Judgement: Partial Orientation: Person, Place, Time, Situation Obsessive Compulsive Thoughts/Behaviors: None  Cognitive Functioning Concentration: Poor Memory: Recent Intact, Remote Intact IQ: Average Insight: Fair Appetite: Poor Weight Loss: 5 Weight Gain: 0 Sleep: Decreased Total Hours of Sleep: (a few)  ADLScreening [facility] Assessment Services) Patient's cognitive ability adequate to safely complete daily activities?: Yes Patient able to express need for assistance with ADLs?: Yes Independently  performs ADLs?: Yes (appropriate for developmental age)  Prior Inpatient Therapy Prior Inpatient Therapy: Yes Prior Therapy Dates: 2018 Prior Therapy Facilty/Provider(s): [facility] Reason for Treatment: SA  Prior Outpatient Therapy Prior Outpatient Therapy: Yes Prior Therapy Dates: past 3  years Prior Therapy Facilty/Provider(s): [facility] Reason for Treatment: SA Does patient have an ACCT team?: No Does patient have Intensive In-House Services?  : No Does patient have [facility] services? : No Does patient have P4CC services?: No  ADL Screening (condition at time of admission) Patient's cognitive ability adequate to safely complete daily activities?: Yes Is the patient deaf or have difficulty hearing?: No Does the patient have difficulty seeing, even when wearing glasses/contacts?: No Does the patient have difficulty concentrating, remembering, or making decisions?: No Patient able to express need for assistance with ADLs?: Yes Does the patient have difficulty dressing or bathing?: No Independently performs ADLs?: Yes (appropriate for developmental age) Does the patient have difficulty walking or climbing stairs?: No Weakness of Legs: None Weakness of Arms/Hands: None       Abuse/Neglect Assessment (Assessment to be complete while patient is alone) Abuse/Neglect Assessment Can Be Completed: Yes Physical Abuse: Denies Verbal Abuse: Denies Sexual Abuse: Denies Exploitation of patient/patient's resources: Denies Self-Neglect: Denies Values / Beliefs Cultural Requests During Hospitalization: None Spiritual Requests During Hospitalization: None Consults Spiritual Care Consult Needed: No Social Work Consult Needed: No Merchant navy officer (For Healthcare) Does Patient Have a Medical Advance Directive?: No Would patient like information on creating a medical advance directive?: No - Patient declined    Additional Information 1:1 In Past 12 Months?: No CIRT Risk: No Elopement Risk: No Does patient have medical clearance?: Yes     Disposition:  Disposition Initial Assessment Completed for this Encounter: Yes Disposition of Patient: Inpatient treatment program Type of inpatient treatment program: Adult  This service was provided via telemedicine using a 2-way,  interactive audio and Immunologist.  Names of all persons participating in this telemedicine service and their role in this encounter. Name: Carla Park, Carla Park Role: pt  Name: Barrington Ellison Role: TTS counselor          [facility] 09/12/2017 11:44 AM

## 2017-09-12 NOTE — Progress Notes (Signed)
D Pt is observed standing at the med window, , she is diaphoretic, she is tremulous, she is nervous and anxious. AA SHe says " can you give me my medicine now? She tells this Clinical research associate she has detox'd before. Then she starts crying and she says " I dont' know what happeend..myalgias life just fell apart". R Safety is in place. Pt given pitcher of gatorade as well as scheduled ativan.

## 2017-09-12 NOTE — Tx Team (Signed)
Initial Treatment Plan 09/12/2017 7:29 PM Carla Park QHU:[id number]    PATIENT STRESSORS: Health problems Marital or family conflict Substance abuse   PATIENT STRENGTHS: Capable of independent living Communication skills General fund of knowledge Motivation for treatment/growth Work skills   PATIENT IDENTIFIED PROBLEMS: Depression  Suicidal ideation  Substance abuse  "Get sober"  "Get my head straight"  "Have a plan for the future"            DISCHARGE CRITERIA:  Improved stabilization in mood, thinking, and/or behavior Verbal commitment to aftercare and medication compliance Withdrawal symptoms are absent or subacute and managed without 24-hour nursing intervention  PRELIMINARY DISCHARGE PLAN: Outpatient therapy Medication management  PATIENT/FAMILY INVOLVEMENT: This treatment plan has been presented to and reviewed with the patient, Carla Park.  The patient and family have been given the opportunity to ask questions and make suggestions.  Levin Bacon, RN 09/12/2017, 7:29 PM

## 2017-09-12 NOTE — ED Notes (Signed)
Pt voiced understanding and agreement w/tx plan - signed consent forms - copy faxed to [facility], copy sent to Medical Records, and original placed in envelope for [facility].

## 2017-09-12 NOTE — Progress Notes (Signed)
Carla Park is a 37 year old female being admitted voluntarily 303-[facility] from [facility].  She came to the ED for depression, suicidal ideation and relapse on alcohol.  During [facility] admission, she continues to report suicidal ideation and will contract for safety on the unit.  She reported that her husband recently told her that he wasn't happy and is leaving her and she feels alone since her family lives far away.  She relapsed on alcohol for the past month.  She has multiple medical issues and is worried about her future finances because she doesn't think that she will be able to live on her own.  Oriented her to the unit.  Admission paperwork completed and signed.  Belongings searched and secured in locker # 14, no contraband found.  Skin assessment completed and no skin issues noted.  Q 15 minute checks initiated for safety.  We will continue to monitor the progress towards her goals.

## 2017-09-12 NOTE — ED Notes (Signed)
Graham crackers, peanut butter, and Sprite given as requested for snack.

## 2017-09-12 NOTE — ED Triage Notes (Signed)
Pt. Stated, Carla Park been suicidal for 2-3 weeks and I started drinking and I can't stop. Last drinking was 400 this am. I drank whatever was left.

## 2017-09-12 NOTE — ED Provider Notes (Signed)
[facility] EMERGENCY DEPARTMENT Provider Note   CSN: [id number] Arrival date & time: 09/12/17  0820     History   Chief Complaint Chief Complaint  Patient presents with  . Suicidal  . Alcohol Problem    HPI   Blood pressure 137/89, pulse 72, temperature 98.3 F (36.8 C), temperature source Oral, resp. rate 16, height 5\' 2"  (1.575 m), weight 63.5 kg (140 lb), SpO2 97 %.  Carla Park is a 37 y.o. female complaining of complaining of suicidal ideation with a plantar's to her wrist, she has prior suicide attempts with wrist cutting.  She states that she has been drinking excessively since Christmas, at that time her and her husband have initiated separation and divorce proceedings.  She also stopped taking all of her psychiatric medication at that time.  She was clean from alcohol for 6 months in the preceding months.  She has had auditory hallucinations with alcohol withdrawal.  Her last alcohol intake was at 4 AM today.  She has been drinking daily.  Past Medical History:  Diagnosis Date  . Arthritis   . Chronic pain   . Fibromyalgia   . Hep C w/ coma, chronic (HCC)   . Hepatitis C   . Vision abnormalities     Patient Active Problem List   Diagnosis Date Noted  . Cervical radiculopathy at C6 05/19/2017  . Hepatitis C infection 05/19/2017  . Multiple joint pain 02/06/2017  . 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 Memory loss 02/06/2017  . Family history of MS (multiple sclerosis) 02/06/2017  . Alcohol use disorder, moderate, dependence (HCC) 01/28/2017  . MDD (major depressive disorder), recurrent severe, without psychosis (HCC) 01/14/2017  . 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 Alcohol withdrawal (HCC) 05/29/2015  . Chronic pain   . Fibromyalgia   . Hep C w/ coma, chronic (HCC)   . Migraines 11/18/2012  . RA (rheumatoid arthritis) (HCC) 11/18/2012    Past Surgical History:  Procedure Laterality Date  .  ABDOMINAL HYSTERECTOMY    . APPENDECTOMY    . CESAREAN SECTION    . CHOLECYSTECTOMY    . KNEE SURGERY      OB History    No data available       Home Medications    Prior to Admission medications   Medication Sig Start Date End Date Taking? Authorizing Provider  ARIPiprazole (ABILIFY) 10 MG tablet Take 10 mg by mouth. 04/29/17   [provider]  busPIRone (BUSPAR) 10 MG tablet Take 10 mg by mouth. 04/29/17   [provider]  escitalopram (LEXAPRO) 20 MG tablet Take 1 tablet (20 mg total) by mouth at bedtime. For depression 01/28/17   Armandina Stammer I, NP  etodolac (LODINE) 400 MG tablet Take 1 tablet (400 mg total) by mouth 2 (two) times daily. 05/19/17   Sater, Pearletha Furl, MD  gabapentin (NEURONTIN) 600 MG tablet Take 1 tablet (600 mg total) by mouth 3 (three) times daily. 05/19/17   Sater, Pearletha Furl, MD  hydrOXYzine (ATARAX/VISTARIL) 25 MG tablet Take 1 tablet (25 mg total) by mouth every 6 (six) hours as needed for anxiety. 01/28/17   Armandina Stammer I, NP  methadone (DOLOPHINE) 10 MG/ML solution Take 16 mLs (160 mg total) by mouth daily. For opioid addiction 01/28/17   Armandina Stammer I, NP  Multiple Vitamin (MULTIVITAMIN WITH MINERALS) TABS tablet Take 1 tablet by mouth 3 (three) times daily. For low Vitamin 01/28/17   Nwoko,  Nelda Marseille, NP  tizanidine (ZANAFLEX) 2 MG capsule Take 2 mg by mouth. 04/29/17   [provider]  traZODone (DESYREL) 100 MG tablet Take 1 tablet (100 mg total) by mouth at bedtime. For sleep 01/28/17   Sanjuana Kava, NP    Family History Family History  Problem Relation Age of Onset  . Multiple sclerosis Mother   . Hypertension Father     Social History Social History   Tobacco Use  . Smoking status: Current Every Day Smoker    Packs/day: 1.00    Types: Cigarettes  . Smokeless tobacco: Never Used  Substance Use Topics  . Alcohol use: Yes    Comment: 2-500 ml boxes daily at least  . Drug use: No     Allergies   Erythromycin;  Lamictal [lamotrigine]; and Sumatriptan   Review of Systems Review of Systems  A complete review of systems was obtained and all systems are negative except as noted in the HPI and PMH.   Physical Exam Updated Vital Signs BP 120/70   Pulse 68   Temp 98.3 F (36.8 C) (Oral)   Resp 16   Ht 5\' 2"  (1.575 m)   Wt 63.5 kg (140 lb)   SpO2 97%   BMI 25.61 kg/m   Physical Exam  Constitutional: She is oriented to person, place, and time. She appears well-developed and well-nourished. No distress.  HENT:  Head: Normocephalic and atraumatic.  Mouth/Throat: Oropharynx is clear and moist.  Eyes: Conjunctivae and EOM are normal. Pupils are equal, round, and reactive to light.  Neck: Normal range of motion.  Cardiovascular: Normal rate, regular rhythm and intact distal pulses.  Pulmonary/Chest: Effort normal and breath sounds normal.  Abdominal: Soft. There is no tenderness.  Musculoskeletal: Normal range of motion.  Neurological: She is alert and oriented to person, place, and time.  Skin: She is not diaphoretic.  Longitudinal cut marks to volar left wrist, remote and well-healed  Psychiatric: She has a normal mood and affect. She expresses suicidal ideation. She expresses no homicidal ideation. She expresses suicidal plans. She expresses no homicidal plans.  Tearful, depressed  Nursing note and vitals reviewed.    ED Treatments / Results  Labs (all labs ordered are listed, but only abnormal results are displayed) Labs Reviewed  COMPREHENSIVE METABOLIC PANEL - Abnormal; Notable for the following components:      Result Value   Chloride 98 (*)    AST 169 (*)    ALT 183 (*)    Alkaline Phosphatase 213 (*)    Anion gap 16 (*)    All other components within normal limits  ETHANOL - Abnormal; Notable for the following components:   Alcohol, Ethyl (B) 152 (*)    All other components within normal limits  CBC - Abnormal; Notable for the following components:   Hemoglobin 15.7 (*)      Platelets 141 (*)    All other components within normal limits  ACETAMINOPHEN LEVEL - Abnormal; Notable for the following components:   Acetaminophen (Tylenol), Serum <10 (*)    All other components within normal limits  RAPID URINE DRUG SCREEN, HOSP PERFORMED  SALICYLATE LEVEL  I-STAT BETA HCG BLOOD, ED ([facility], [facility], [facility] ONLY)  I-STAT BETA HCG BLOOD, ED ([facility], [facility], [facility] ONLY)    EKG  EKG Interpretation None       Radiology No results found.  Procedures Procedures (including critical care time)  Medications Ordered in ED Medications  LORazepam (ATIVAN) injection 0-4 mg (  Intravenous See Alternative 09/12/17 1202)    Or  LORazepam (ATIVAN) tablet 0-4 mg (1 mg Oral Given 09/12/17 1202)  LORazepam (ATIVAN) injection 0-4 mg (not administered)    Or  LORazepam (ATIVAN) tablet 0-4 mg (not administered)  thiamine (VITAMIN B-1) tablet 100 mg (100 mg Oral Given 09/12/17 1202)    Or  thiamine (B-1) injection 100 mg ( Intravenous See Alternative 09/12/17 1202)  acetaminophen (TYLENOL) tablet 650 mg (not administered)  zolpidem (AMBIEN) tablet 5 mg (not administered)  ondansetron (ZOFRAN) tablet 4 mg (4 mg Oral Given 09/12/17 1202)  alum & mag hydroxide-simeth (MAALOX/MYLANTA) 200-200-20 MG/5ML suspension 30 mL (not administered)  nicotine (NICODERM CQ - dosed in mg/24 hours) patch 21 mg (21 mg Transdermal Patch Applied 09/12/17 1203)     Initial Impression / Assessment and Plan / ED Course  I have reviewed the triage vital signs and the nursing notes.  Pertinent labs & imaging results that were available during my care of the patient were reviewed by me and considered in my medical decision making (see chart for details).    Vitals:   09/12/17 0832 09/12/17 0834 09/12/17 1146 09/12/17 1201  BP: 137/89  120/70 120/70  Pulse: 72  68 68  Resp: 16  16   Temp: 98.3 F (36.8 C)  98.3 F (36.8 C)   TempSrc: Oral  Oral   SpO2: 97%  97%   Weight:  63.5 kg (140 lb)    Height:  5\' 2"  (1.575  m)      Medications  LORazepam (ATIVAN) injection 0-4 mg ( Intravenous See Alternative 09/12/17 1202)    Or  LORazepam (ATIVAN) tablet 0-4 mg (1 mg Oral Given 09/12/17 1202)  LORazepam (ATIVAN) injection 0-4 mg (not administered)    Or  LORazepam (ATIVAN) tablet 0-4 mg (not administered)  thiamine (VITAMIN B-1) tablet 100 mg (100 mg Oral Given 09/12/17 1202)    Or  thiamine (B-1) injection 100 mg ( Intravenous See Alternative 09/12/17 1202)  acetaminophen (TYLENOL) tablet 650 mg (not administered)  zolpidem (AMBIEN) tablet 5 mg (not administered)  ondansetron (ZOFRAN) tablet 4 mg (4 mg Oral Given 09/12/17 1202)  alum & mag hydroxide-simeth (MAALOX/MYLANTA) 200-200-20 MG/5ML suspension 30 mL (not administered)  nicotine (NICODERM CQ - dosed in mg/24 hours) patch 21 mg (21 mg Transdermal Patch Applied 09/12/17 1203)    Carla Park is 37 y.o. female presenting with suicidal ideation, plan to commit suicide by slitting her wrist she has a prior suicide attempt in the same way.  She has been off of all of her psychiatric medications and drinking heavily since December when her and her husband split up.  No other drug use.  Transaminitis with negative Tylenol level, patient denies any other coingestants.  Patient is medically cleared for psychiatric evaluation will be transferred to the psych ED. TTS consulted, home meds and psych standard holding orders placed.   She states she is on 170 mg of methadone, January has called the methadone center but it is closed, she got her dose this morning.  Kriste Basque will be working tomorrow and she will called the methadone center in the morning to verify her dosage, I have not ordered her any methadone.      Final Clinical Impressions(s) / ED Diagnoses   Final diagnoses:  Alcohol abuse  Suicidal ideation    ED Discharge Orders    None       Kriste Basque Lynetta Mare 09/12/17 1354    09/14/17, MD 09/12/17 1549

## 2017-09-12 NOTE — ED Notes (Signed)
Heather, RN, [facility] - will have someone to call back for report.

## 2017-09-13 DIAGNOSIS — F1120 Opioid dependence, uncomplicated: Secondary | ICD-10-CM

## 2017-09-13 DIAGNOSIS — Z630 Problems in relationship with spouse or partner: Secondary | ICD-10-CM

## 2017-09-13 DIAGNOSIS — Z915 Personal history of self-harm: Secondary | ICD-10-CM

## 2017-09-13 DIAGNOSIS — R110 Nausea: Secondary | ICD-10-CM

## 2017-09-13 DIAGNOSIS — Z818 Family history of other mental and behavioral disorders: Secondary | ICD-10-CM

## 2017-09-13 DIAGNOSIS — Z811 Family history of alcohol abuse and dependence: Secondary | ICD-10-CM

## 2017-09-13 DIAGNOSIS — R45851 Suicidal ideations: Secondary | ICD-10-CM

## 2017-09-13 MED ORDER — GABAPENTIN 100 MG PO CAPS
200.0000 mg | ORAL_CAPSULE | Freq: Three times a day (TID) | ORAL | Status: DC
  Administered 2017-09-13 – 2017-09-15 (×7): 200 mg via ORAL
  Filled 2017-09-13 (×10): qty 2

## 2017-09-13 MED ORDER — HYDROXYZINE HCL 25 MG PO TABS
25.0000 mg | ORAL_TABLET | Freq: Four times a day (QID) | ORAL | Status: DC | PRN
  Administered 2017-09-13 – 2017-09-15 (×5): 25 mg via ORAL
  Filled 2017-09-13 (×5): qty 1

## 2017-09-13 MED ORDER — THIAMINE HCL 100 MG/ML IJ SOLN: 100.0000 mg | Freq: Once | INTRAMUSCULAR | Status: DC

## 2017-09-13 MED ORDER — LORAZEPAM 1 MG PO TABS
1.0000 mg | ORAL_TABLET | Freq: Four times a day (QID) | ORAL | Status: DC | PRN
  Administered 2017-09-13 – 2017-09-14 (×4): 1 mg via ORAL
  Filled 2017-09-13 (×5): qty 1

## 2017-09-13 MED ORDER — DULOXETINE HCL 20 MG PO CPEP
20.0000 mg | ORAL_CAPSULE | Freq: Every day | ORAL | Status: DC
Start: 2017-09-13 — End: 2017-09-16
  Administered 2017-09-13 – 2017-09-16 (×4): 20 mg via ORAL
  Filled 2017-09-13 (×6): qty 1

## 2017-09-13 MED ORDER — ARIPIPRAZOLE 5 MG PO TABS
5.0000 mg | ORAL_TABLET | Freq: Every day | ORAL | Status: DC
  Administered 2017-09-13 – 2017-09-16 (×4): 5 mg via ORAL
  Filled 2017-09-13 (×6): qty 1

## 2017-09-13 MED ORDER — LOPERAMIDE HCL 2 MG PO CAPS: 2.0000 mg | ORAL_CAPSULE | ORAL | Status: DC | PRN

## 2017-09-13 MED ORDER — CLONIDINE HCL 0.1 MG PO TABS
0.1000 mg | ORAL_TABLET | Freq: Two times a day (BID) | ORAL | Status: DC
  Administered 2017-09-13 – 2017-09-15 (×4): 0.1 mg via ORAL
  Filled 2017-09-13 (×8): qty 1

## 2017-09-13 MED ORDER — METHADONE HCL 10 MG PO TABS
40.0000 mg | ORAL_TABLET | Freq: Every day | ORAL | Status: DC
  Administered 2017-09-13: 40 mg via ORAL
  Filled 2017-09-13: qty 4

## 2017-09-13 MED ORDER — ONDANSETRON 4 MG PO TBDP
4.0000 mg | ORAL_TABLET | Freq: Four times a day (QID) | ORAL | Status: DC | PRN
  Administered 2017-09-13: 4 mg via ORAL
  Filled 2017-09-13: qty 1

## 2017-09-13 MED ORDER — ADULT MULTIVITAMIN W/MINERALS CH
1.0000 | ORAL_TABLET | Freq: Every day | ORAL | Status: DC
  Administered 2017-09-14 – 2017-09-16 (×3): 1 via ORAL
  Filled 2017-09-13 (×4): qty 1

## 2017-09-13 MED ORDER — VITAMIN B-1 100 MG PO TABS
100.0000 mg | ORAL_TABLET | Freq: Every day | ORAL | Status: DC
  Administered 2017-09-14 – 2017-09-16 (×3): 100 mg via ORAL
  Filled 2017-09-13 (×4): qty 1

## 2017-09-13 NOTE — H&P (Addendum)
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 MDD (major depressive disorder) [F32.9] 09/12/2017  . Cervical radiculopathy at C6 [M54.12] 05/19/2017  . Hepatitis C infection [B19.20] 05/19/2017  . Multiple joint pain [M25.50] 02/06/2017  . 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 Memory loss [R41.3] 02/06/2017  . Family history of MS (multiple sclerosis) [Z82.0] 02/06/2017  . Alcohol use disorder, moderate, dependence ([place]) [F10.20] 01/28/2017  . MDD (major depressive disorder), recurrent severe, without psychosis ([place]) [F33.2] 01/14/2017  . Long-term current use of methadone for opiate dependence ([place]) [F11.20] 06/18/2015  . Alcohol withdrawal ([place]) [F10.239] 05/29/2015  . Chronic pain [G89.29]   . Fibromyalgia [M79.7]   . Hep C w/ coma, chronic ([place]) [B18.2]   . Migraines [G43.909] 11/18/2012  . RA (rheumatoid arthritis) ([facility]) [M06.9] 11/18/2012   History of Present Illness: 37 year old female , known to our unit from prior admission. Presented to ED voluntarily on 1/26 for worsening depression, suicidal ideations of cutting herself  , and daily alcohol consumption after relapsing on alcohol about one month ago.  States she has been drinking about 6 glasses of wine per day. Endorses neuro-vegetative symptoms of depression as below. Denies psychotic symptoms. She describes significant psychosocial stressors, mainly marital discord and current separation process . She also reports she has not been taking her psychiatric medications as regularly as before ( Lexapro, Abilify , Buspar).  * of note, patient reports she is on methadone maintenance at [facility] . States she  is on Methadone 170 mrs daily , which she states she is taking  regularly and up to yesterday , when she presented to ED .     Associated Signs/Symptoms: Depression Symptoms:  depressed mood, anhedonia, insomnia, suicidal thoughts with specific plan, anxiety, loss of energy/fatigue, (Hypo) Manic Symptoms:  Irritability  Anxiety Symptoms:  Reports worsening anxiety  Psychotic Symptoms:  Denies  PTSD Symptoms: Does not currently endorse  Total Time spent with patient: 45 minutes  Past Psychiatric History: patient reports history of depression, reports history of prior suicide attempt. Denies history of psychosis , denies history of mania . She had been admitted to our unit in May of 2018, for similar presentation ( worsening depression,suicidal ideations, alcohol abuse) . At the time was diagnosed with MDD without psychotic features . * On said admission her Methadone maintenance dose was confirmed at 160 mgrs daily and she was continued on this medication during the admission.      Is the patient at risk to self? Yes.    Has the patient been a risk to self in the past 6 months? Yes.    Has the patient been a risk to self within the distant past? Yes.    Is the patient a risk to others? No.  Has the patient been a risk to others in the past 6 months? No.  Has the patient been a risk to others within the distant past? No.   Prior Inpatient Therapy:  as above  Prior Outpatient Therapy:  receives methadone maintenance at [facility]   Alcohol Screening: 1. How often do you have a drink containing alcohol?: 4 or more times a week 2. How many drinks containing alcohol do you have on a typical day when you are  drinking?: 7, 8, or 9 3. How often do you have six or more drinks on one occasion?: Daily or almost daily AUDIT-C Score: 11 4. How often during the last year have you found that you were not able to stop drinking once you had started?: Daily or almost daily 5. How often during the  last year have you failed to do what was normally expected from you becasue of drinking?: Daily or almost daily 6. How often during the last year have you needed a first drink in the morning to get yourself going after a heavy drinking session?: Daily or almost daily 7. How often during the last year have you had a feeling of guilt of remorse after drinking?: Daily or almost daily 8. How often during the last year have you been unable to remember what happened the night before because you had been drinking?: Daily or almost daily 9. Have you or someone else been injured as a result of your drinking?: No 10. Has a relative or friend or a doctor or another health worker been concerned about your drinking or suggested you cut down?: Yes, during the last year Alcohol Use Disorder Identification Test Final Score (AUDIT): 35 Intervention/Follow-up: Alcohol Education Substance Abuse History in the last 12 months:  History of alcohol dependence, reports she relapsed about a month ago. History of Opiate Dependence, on Agonist therapy ( Methadone ) . Denies illicit opiate use at present .  Consequences of Substance Abuse: Does not endorse  Previous Psychotropic Medications:  Was on Abilify 10 mgrs QDAY, Lexapro 20 mgrs QDAY, Buspar 10 mgrs QDAY - states she has not been taking her psychiatric medications regularly since her relapse . States she does not think Lexapro is helping her .  She states she was taking Methadone maintenance at 170 mgrs daily, and Neurontin at 600 mgrs TID daily.        ( For pain and and anxiety)  Patient denies having had any side effects or drug drug interactions on above medication combination. States she was tolerating medications well without side effects. Psychological Evaluations:  No  Past Medical History:  Past Medical History:  Diagnosis Date  . Arthritis   . Chronic pain   . Depression   . ETOH abuse   . Fibromyalgia   . Hep C w/ coma, chronic ([place])   . Hepatitis C    . Opiate abuse, continuous ([place])    on Methadone currently  . Suicide attempt ([place])    cut wrist  . Vision abnormalities     Past Surgical History:  Procedure Laterality Date  . ABDOMINAL HYSTERECTOMY    . APPENDECTOMY    . CESAREAN SECTION    . CHOLECYSTECTOMY    . KNEE SURGERY     Family History: parents are alive, separated . Has one sibling  Family History  Problem Relation Age of Onset  . Multiple sclerosis Mother   . Hypertension Father    Family Psychiatric  History:History of depression and of alcohol abuse in family members, no suicides in family . Tobacco Screening: Have you used any form of tobacco in the last 30 days? (Cigarettes, Smokeless Tobacco, Cigars, and/or Pipes): Yes Tobacco use, Select all that apply: 5 or more cigarettes per day Are you interested in Tobacco Cessation Medications?: Yes, will notify MD for an order Counseled patient on smoking cessation including recognizing danger situations, developing coping skills and basic information about quitting provided: Refused/Declined practical counseling Social History: Patient is married, lives  with husband, son . She states there has been increased marital discord recently and that they are now in process of separating .  Social History   Substance and Sexual Activity  Alcohol Use Yes   Comment: 2-500 ml boxes daily at least     Social History   Substance and Sexual Activity  Drug Use No    Additional Social History:  Allergies:   Allergies  Allergen Reactions  . Erythromycin Hives  . Lamictal [Lamotrigine] Dermatitis  . Sumatriptan Other (See Comments)    Becomes angry   Lab Results:  Results for orders placed or performed during the hospital encounter of 09/12/17 (from the past 48 hour(s))  Rapid urine drug screen (hospital performed)     Status: None   Collection Time: 09/12/17  8:36 AM  Result Value Ref Range   Opiates NONE DETECTED NONE DETECTED   Cocaine NONE DETECTED NONE DETECTED    Benzodiazepines NONE DETECTED NONE DETECTED   Amphetamines NONE DETECTED NONE DETECTED   Tetrahydrocannabinol NONE DETECTED NONE DETECTED   Barbiturates NONE DETECTED NONE DETECTED    Comment: (NOTE) DRUG SCREEN FOR MEDICAL PURPOSES ONLY.  IF CONFIRMATION IS NEEDED FOR ANY PURPOSE, NOTIFY LAB WITHIN 5 DAYS. LOWEST DETECTABLE LIMITS FOR URINE DRUG SCREEN Drug Class                     Cutoff (ng/mL) Amphetamine and metabolites    1000 Barbiturate and metabolites    200 Benzodiazepine                 263 Tricyclics and metabolites     300 Opiates and metabolites        300 Cocaine and metabolites        300 THC                            50   Comprehensive metabolic panel     Status: Abnormal   Collection Time: 09/12/17  9:10 AM  Result Value Ref Range   Sodium 137 135 - 145 mmol/L   Potassium 4.0 3.5 - 5.1 mmol/L   Chloride 98 (L) 101 - 111 mmol/L   CO2 23 22 - 32 mmol/L   Glucose, Bld 89 65 - 99 mg/dL   BUN 9 6 - 20 mg/dL   Creatinine, Ser 0.74 0.44 - 1.00 mg/dL   Calcium 8.9 8.9 - 10.3 mg/dL   Total Protein 7.1 6.5 - 8.1 g/dL   Albumin 3.8 3.5 - 5.0 g/dL   AST 169 (H) 15 - 41 U/L   ALT 183 (H) 14 - 54 U/L   Alkaline Phosphatase 213 (H) 38 - 126 U/L   Total Bilirubin 0.5 0.3 - 1.2 mg/dL   GFR calc non Af Amer >60 >60 mL/min   GFR calc Af Amer >60 >60 mL/min    Comment: (NOTE) The eGFR has been calculated using the CKD EPI equation. This calculation has not been validated in all clinical situations. eGFR's persistently <60 mL/min signify possible Chronic Kidney Disease.    Anion gap 16 (H) 5 - 15  Ethanol     Status: Abnormal   Collection Time: 09/12/17  9:10 AM  Result Value Ref Range   Alcohol, Ethyl (B) 152 (H) <10 mg/dL    Comment:        LOWEST DETECTABLE LIMIT FOR SERUM ALCOHOL IS 10 mg/dL FOR MEDICAL PURPOSES ONLY   cbc  Status: Abnormal   Collection Time: 09/12/17  9:10 AM  Result Value Ref Range   WBC 7.3 4.0 - 10.5 K/uL   RBC 5.04 3.87 - 5.11  MIL/uL   Hemoglobin 15.7 (H) 12.0 - 15.0 g/dL   HCT 45.6 36.0 - 46.0 %   MCV 90.5 78.0 - 100.0 fL   MCH 31.2 26.0 - 34.0 pg   MCHC 34.4 30.0 - 36.0 g/dL   RDW 14.5 11.5 - 15.5 %   Platelets 141 (L) 150 - 400 K/uL  Acetaminophen level     Status: Abnormal   Collection Time: 09/12/17  9:10 AM  Result Value Ref Range   Acetaminophen (Tylenol), Serum <10 (L) 10 - 30 ug/mL    Comment:        THERAPEUTIC CONCENTRATIONS VARY SIGNIFICANTLY. A RANGE OF 10-30 ug/mL MAY BE AN EFFECTIVE CONCENTRATION FOR MANY PATIENTS. HOWEVER, SOME ARE BEST TREATED AT CONCENTRATIONS OUTSIDE THIS RANGE. ACETAMINOPHEN CONCENTRATIONS >150 ug/mL AT 4 HOURS AFTER INGESTION AND >50 ug/mL AT 12 HOURS AFTER INGESTION ARE OFTEN ASSOCIATED WITH TOXIC REACTIONS.   Salicylate level     Status: None   Collection Time: 09/12/17  9:10 AM  Result Value Ref Range   Salicylate Lvl <0.3 2.8 - 30.0 mg/dL  I-Stat beta hCG blood, ED     Status: None   Collection Time: 09/12/17  9:14 AM  Result Value Ref Range   I-stat hCG, quantitative <5.0 <5 mIU/mL   Comment 3            Comment:   GEST. AGE      CONC.  (mIU/mL)   <=1 WEEK        5 - 50     2 WEEKS       50 - 500     3 WEEKS       100 - 10,000     4 WEEKS     1,000 - 30,000        FEMALE AND NON-PREGNANT FEMALE:     LESS THAN 5 mIU/mL     Blood Alcohol level:  Lab Results  Component Value Date   ETH 152 (H) 09/12/2017   ETH 167 (H) 47/42/5956    Metabolic Disorder Labs:  No results found for: HGBA1C, MPG No results found for: PROLACTIN No results found for: CHOL, TRIG, HDL, CHOLHDL, VLDL, LDLCALC  Current Medications: Current Facility-Administered Medications  Medication Dose Route Frequency Provider Last Rate Last Dose  . acetaminophen (TYLENOL) tablet 650 mg  650 mg Oral Q6H PRN Okonkwo, Justina A, NP      . alum & mag hydroxide-simeth (MAALOX/MYLANTA) 200-200-20 MG/5ML suspension 30 mL  30 mL Oral Q4H PRN Okonkwo, Justina A, NP      . ARIPiprazole  (ABILIFY) tablet 5 mg  5 mg Oral Daily Cobos, Myer Peer, MD   5 mg at 09/13/17 1017  . DULoxetine (CYMBALTA) DR capsule 20 mg  20 mg Oral Daily Cobos, Myer Peer, MD   20 mg at 09/13/17 1017  . gabapentin (NEURONTIN) capsule 200 mg  200 mg Oral TID Cobos, Myer Peer, MD      . hydrOXYzine (ATARAX/VISTARIL) tablet 25 mg  25 mg Oral Q6H PRN Cobos, Fernando A, MD      . loperamide (IMODIUM) capsule 2-4 mg  2-4 mg Oral PRN Cobos, Myer Peer, MD      . LORazepam (ATIVAN) tablet 1 mg  1 mg Oral Q6H PRN Cobos, Myer Peer, MD      .  magnesium hydroxide (MILK OF MAGNESIA) suspension 30 mL  30 mL Oral Daily PRN Okonkwo, Justina A, NP      . methadone (DOLOPHINE) tablet 40 mg  40 mg Oral Daily Cobos, Myer Peer, MD   40 mg at 09/13/17 1017  . multivitamin with minerals tablet 1 tablet  1 tablet Oral Daily Cobos, Fernando A, MD      . nicotine (NICODERM CQ - dosed in mg/24 hours) patch 21 mg  21 mg Transdermal Daily Cobos, Myer Peer, MD   21 mg at 09/13/17 8182  . ondansetron (ZOFRAN-ODT) disintegrating tablet 4 mg  4 mg Oral Q6H PRN Cobos, Myer Peer, MD   4 mg at 09/13/17 1017  . pneumococcal 23 valent vaccine (PNU-IMMUNE) injection 0.5 mL  0.5 mL Intramuscular Tomorrow-1000 Cobos, Fernando A, MD      . thiamine (B-1) injection 100 mg  100 mg Intramuscular Once Cobos, Myer Peer, MD      . Derrill Memo ON 09/14/2017] thiamine (VITAMIN B-1) tablet 100 mg  100 mg Oral Daily Cobos, Myer Peer, MD      . traZODone (DESYREL) tablet 50 mg  50 mg Oral QHS PRN Lu Duffel, Justina A, NP   50 mg at 09/12/17 2109   PTA Medications: Medications Prior to Admission  Medication Sig Dispense Refill Last Dose  . ARIPiprazole (ABILIFY) 10 MG tablet Take 10 mg by mouth.   Taking  . busPIRone (BUSPAR) 10 MG tablet Take 10 mg by mouth.   Taking  . escitalopram (LEXAPRO) 20 MG tablet Take 1 tablet (20 mg total) by mouth at bedtime. For depression 30 tablet 0 Taking  . etodolac (LODINE) 400 MG tablet Take 1 tablet (400 mg total) by  mouth 2 (two) times daily. 60 tablet 5   . gabapentin (NEURONTIN) 600 MG tablet Take 1 tablet (600 mg total) by mouth 3 (three) times daily. 90 tablet 5   . hydrOXYzine (ATARAX/VISTARIL) 25 MG tablet Take 1 tablet (25 mg total) by mouth every 6 (six) hours as needed for anxiety. 60 tablet 0 Taking  . methadone (DOLOPHINE) 10 MG/ML solution Take 16 mLs (160 mg total) by mouth daily. For opioid addiction  0 Taking  . Multiple Vitamin (MULTIVITAMIN WITH MINERALS) TABS tablet Take 1 tablet by mouth 3 (three) times daily. For low Vitamin 1 tablet 0 Taking  . tizanidine (ZANAFLEX) 2 MG capsule Take 2 mg by mouth.   Taking  . traZODone (DESYREL) 100 MG tablet Take 1 tablet (100 mg total) by mouth at bedtime. For sleep 30 tablet 0 Taking    Musculoskeletal: Strength & Muscle Tone: within normal limits subtle distal tremors, no diaphoresis, no acute distress  Gait & Station: normal Patient leans: N/A  Psychiatric Specialty Exam: Physical Exam  Review of Systems  Constitutional: Negative.   HENT: Negative.   Eyes: Negative.   Respiratory: Negative.   Cardiovascular: Negative.   Gastrointestinal: Positive for nausea. Negative for vomiting.  Genitourinary: Negative.   Musculoskeletal:       Reports chronic pain  Skin: Negative.   Neurological: Negative for seizures.       No history of seizures   Endo/Heme/Allergies: Negative.   Psychiatric/Behavioral: Positive for depression, substance abuse and suicidal ideas.  All other systems reviewed and are negative.   Blood pressure (!) 133/91, pulse 81, temperature 98.2 F (36.8 C), temperature source Oral, resp. rate 18, height '5\' 2"'$  (1.575 m), weight 64.4 kg (142 lb), SpO2 100 %.Body mass index is 25.97 kg/m.  General Appearance:  Fairly Groomed  Eye Contact:  Good  Speech:  Normal Rate  Volume:  Decreased  Mood:  Depressed  Affect:  sad, constricted, anxious   Thought Process:  Linear and Descriptions of Associations: Intact  Orientation:   Other:  fully alert and attentive   Thought Content:  no hallucinations, no delusions, not internally preoccupied   Suicidal Thoughts:  No currently denies suicidal plan or intention, and contracts for safety on unit   Homicidal Thoughts:  No  Memory:  recent and remote grossly intact   Judgement:  Fair  Insight:  Fair  Psychomotor Activity:  Normal- subtle distal tremors, no diaphoresis or overt psychomotor restlessness   Concentration:  Concentration: Good and Attention Span: Good  Recall:  Good  Fund of Knowledge:  Good  Language:  Good  Akathisia:  Negative  Handed:  Right  AIMS (if indicated):     Assets:  Desire for Improvement Resilience  ADL's:  Intact  Cognition:  WNL  Sleep:  Number of Hours: 2    Treatment Plan Summary: Daily contact with patient to assess and evaluate symptoms and progress in treatment, Medication management, Plan inpatient treatment  and medications as below  Observation Level/Precautions:  15 minute checks  Laboratory:  as needed   Psychotherapy:  [place] , group therapy   Medications: * I have reviewed medication issues at length with patient , pharmacist, RN staff . Have also discussed case with Dr. Dwyane Dee, Medical Director.  Patient reports she has been on Methadone x several years and does not want to detox off opiates. Patient reports she is on Methadone Maintenance at 170 mgrs QDAY through [facility]- it is currently closed ( Sunday)  so dose cannot be confirmed . Patient does not appear on [place] Opiate Registry , but as reviewed with staff this may be because her methadone is provided via [facility] rather than prescribed directly to her . Her UDS is negative, including for opiates, but as reviewed with Lab, Methadone not tested for on UDS panel used .  Patient did receive Methadone maintenance during her prior admission last year , following dose confirmation, and was discharged back to [facility] management.   Based on above, it was determined with team to  provide Methadone 40 mgrs QDAY today as ongoing maintenance and in order to minimize risk of Opiate WDL until dose can be confirmed via her [facility] clinic in AM. - this confirmed , discussed with Dr. Dwyane Dee , Medical Director .  Will continue Ativan detox Protocol ( PRN rather than standing ) to minimize symptoms of alcohol WDL.  We discussed antidepressant medication options- patient states she does not feel Lexapro is working . Interested in [place] , which she has not been on before, for management of depression and pain.  Start Cymbalta 20 mgrs QDAY - titrate gradually as tolerated  Continue Abilify at 5 mgrs QDAY for antidepressant augmentation, and continue Neurontin at 200 mgrs TID.     Consultations:  As needed   Discharge Concerns: -   Estimated LOS: 5 days   Other:  RN staff to contact [facility] tomorrow in AM to confirm Methadone dose    Physician Treatment Plan for Primary Diagnosis:  MDD, severe, no psychotic symptoms Long Term Goal(s): Improvement in symptoms so as ready for discharge  Short Term Goals: Ability to identify changes in lifestyle to reduce recurrence of condition will improve and Ability to maintain clinical measurements within normal limits will improve  Physician Treatment Plan for Secondary Diagnosis:  Alcohol Dependence  Long Term Goal(s): Improvement in symptoms so as ready for discharge  Short Term Goals: Ability to identify triggers associated with substance abuse/mental health issues will improve  I certify that inpatient services furnished can reasonably be expected to improve the patient's condition.    Jenne Campus, MD 1/27/201910:42 AM   Addendum- 09/13/17 2,20 PM - patient also reports she had been prescribed clonidine (0.2 mgrs BID PRN)  which she had been taking on most days . Denies having had side effects.. Will continue Clonidine 0.1 mgrs BID to minimize risk of rebound hypertension. Gabriel Earing MD

## 2017-09-13 NOTE — Progress Notes (Signed)
Pt attended therapeutic relaxation group with the [facility].  

## 2017-09-13 NOTE — [facility] Group Notes (Signed)
[facility] LCSW Group Therapy Note  Date/Time:  09/13/2017  10:00-11:00AM  Type of Therapy and Topic:  Group Therapy:  Music and Mood  Participation Level:  Did Not Attend   Description of Group: In this process group, members listened to a variety of genres of music and identified that different types of music evoke different responses.  Patients were encouraged to identify music that was soothing for them and music that was energizing for them.  Patients discussed how this knowledge can help with wellness and recovery in various ways including managing depression and anxiety as well as encouraging healthy sleep habits.    Therapeutic Goals: 1. Patients will explore the impact of different varieties of music on mood 2. Patients will verbalize the thoughts they have when listening to different types of music 3. Patients will identify music that is soothing to them as well as music that is energizing to them 4. Patients will discuss how to use this knowledge to assist in maintaining wellness and recovery 5. Patients will explore the use of music as a coping skill  Summary of Patient Progress:  n/a  Therapeutic Modalities: Solution Focused Brief Therapy Activity   Ambrose Mantle, LCSW

## 2017-09-13 NOTE — Progress Notes (Signed)
Pt attended orientation and daily goal group. During this group staff went over the treatment agreement, rules for the unit, and discussed how to set a daily goal.  
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
## 2017-09-13 NOTE — Progress Notes (Signed)
Patient ID: Carla Park, female   DOB: Jun 20, 1981, 37 y.o.   MRN: [id number]  Patient's BP continues to elevate, Clinical research associate informed MD Cobos, new order of Clonidine 0.1 mg BID obtained from MD Cobos.

## 2017-09-13 NOTE — Progress Notes (Signed)
Patient ID: Carla Park, female   DOB: 1981/01/20, 37 y.o.   MRN: [id number]  MD Cobos was notified of patient's latest BP- 186/87. No new orders received.
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
## 2017-09-13 NOTE — Progress Notes (Signed)
Patient ID: Carla Park, female   DOB: 1981/06/30, 37 y.o.   MRN: [id number]  DAR: Pt. Denies HI and A/V Hallucinations. She reports SI, she is able to contract for safety. She reports that her sleep last night was poor, her appetite is poor, her energy level is low, and her concentration is poor. She rates her depression level is 9/10, her hopelessness is 9/10, and her anxiety level is 8/10. She reports withdrawal symptoms including cravings, cramping, agitation, nausea, tremors, and headaches. Support and encouragement provided to the patient. Scheduled medications administered to patient per physician's orders. Patient seen in the milieu throughout the day. She presents with sad affect and intermittently crying. She reports not feeling well today, this is seen in her vital signs, affect, and mood. She reports her goal is, "stabilizing." Q15 minute checks are maintained for safety.

## 2017-09-14 DIAGNOSIS — F419 Anxiety disorder, unspecified: Secondary | ICD-10-CM

## 2017-09-14 DIAGNOSIS — R451 Restlessness and agitation: Secondary | ICD-10-CM

## 2017-09-14 DIAGNOSIS — G4700 Insomnia, unspecified: Secondary | ICD-10-CM

## 2017-09-14 DIAGNOSIS — F1020 Alcohol dependence, uncomplicated: Secondary | ICD-10-CM

## 2017-09-14 DIAGNOSIS — F17210 Nicotine dependence, cigarettes, uncomplicated: Secondary | ICD-10-CM

## 2017-09-14 DIAGNOSIS — F332 Major depressive disorder, recurrent severe without psychotic features: Secondary | ICD-10-CM

## 2017-09-14 LAB — LIPID PANEL
Cholesterol: 213 mg/dL — ABNORMAL HIGH (ref 0–200)
HDL: 86 mg/dL (ref >40)
LDL Cholesterol: 105 mg/dL — ABNORMAL HIGH (ref 0–99)
Total CHOL/HDL Ratio: 2.5 RATIO
Triglycerides: 108 mg/dL (ref <150)
VLDL: 22 mg/dL (ref 0–40)

## 2017-09-14 LAB — TSH: TSH: 9.69 u[IU]/mL — ABNORMAL HIGH (ref 0.350–4.500)

## 2017-09-14 LAB — HEMOGLOBIN A1C
Hgb A1c MFr Bld: 4.8 % (ref 4.8–5.6)
Mean Plasma Glucose: 91.06 mg/dL

## 2017-09-14 LAB — T4, FREE: Free T4: 0.79 ng/dL (ref 0.61–1.12)

## 2017-09-14 MED ORDER — TRAZODONE HCL 100 MG PO TABS
100.0000 mg | ORAL_TABLET | Freq: Every day | ORAL | Status: DC
  Administered 2017-09-14 – 2017-09-15 (×2): 100 mg via ORAL
  Filled 2017-09-14 (×4): qty 1

## 2017-09-14 MED ORDER — METHADONE HCL 5 MG PO TABS
170.0000 mg | ORAL_TABLET | Freq: Every day | ORAL | Status: DC
  Administered 2017-09-14 – 2017-09-15 (×2): 170 mg via ORAL
  Filled 2017-09-14: qty 34

## 2017-09-14 MED ORDER — METHADONE HCL 10 MG PO TABS
ORAL_TABLET | ORAL | Status: AC
  Filled 2017-09-14: qty 17

## 2017-09-14 MED ORDER — LEVOTHYROXINE SODIUM 25 MCG PO TABS
25.0000 ug | ORAL_TABLET | Freq: Every day | ORAL | Status: DC
  Administered 2017-09-15 – 2017-09-16 (×2): 25 ug via ORAL
  Filled 2017-09-14 (×3): qty 1

## 2017-09-14 NOTE — Progress Notes (Signed)
Patient has been accepted to [facility] for 12pm on Wed. Pt will ask her husband to pack her bag and drive her there at discharge on Wed. MD made aware.  Trula Slade, MSW, LCSW Clinical Social Worker 09/14/2017 3:32 PM

## 2017-09-14 NOTE — Progress Notes (Signed)
CSW spoke with Janelle Floor in Admissions at [facility] [place] in attempt to change pt's admission date from Tues, 09/15/17 to Wed or Thursday per patient request. Janelle Floor requested clinicals be faxed because patient will have to be reevaluated for admission due to this hospitalization. Clinicals faxed 09/14/2017 11:19 AM   Trula Slade, MSW, LCSW Clinical Social Worker 09/14/2017 11:19 AM

## 2017-09-14 NOTE — Tx Team (Signed)
Interdisciplinary Treatment and Diagnostic Plan Update  09/14/2017 Time of Session: 0830AM Carla Park MRN: [id number]  Principal Diagnosis: MDD   Secondary Diagnoses: Active Problems:   MDD (major depressive disorder)   Current Medications:  Current Facility-Administered Medications  Medication Dose Route Frequency Provider Last Rate Last Dose  . methadone (DOLOPHINE) 10 MG tablet           . acetaminophen (TYLENOL) tablet 650 mg  650 mg Oral Q6H PRN Okonkwo, Justina A, NP      . alum & mag hydroxide-simeth (MAALOX/MYLANTA) 200-200-20 MG/5ML suspension 30 mL  30 mL Oral Q4H PRN Okonkwo, Justina A, NP      . ARIPiprazole (ABILIFY) tablet 5 mg  5 mg Oral Daily Cobos, Rockey Situ, MD   5 mg at 09/14/17 0805  . cloNIDine (CATAPRES) tablet 0.1 mg  0.1 mg Oral BID Cobos, Rockey Situ, MD   0.1 mg at 09/14/17 0805  . DULoxetine (CYMBALTA) DR capsule 20 mg  20 mg Oral Daily Cobos, Rockey Situ, MD   20 mg at 09/14/17 0805  . gabapentin (NEURONTIN) capsule 200 mg  200 mg Oral TID Cobos, Rockey Situ, MD   200 mg at 09/14/17 0805  . hydrOXYzine (ATARAX/VISTARIL) tablet 25 mg  25 mg Oral Q6H PRN Cobos, Rockey Situ, MD   25 mg at 09/13/17 2211  . loperamide (IMODIUM) capsule 2-4 mg  2-4 mg Oral PRN Cobos, Rockey Situ, MD      . LORazepam (ATIVAN) tablet 1 mg  1 mg Oral Q6H PRN Cobos, Rockey Situ, MD   1 mg at 09/13/17 2326  . magnesium hydroxide (MILK OF MAGNESIA) suspension 30 mL  30 mL Oral Daily PRN Okonkwo, Justina A, NP      . methadone (DOLOPHINE) tablet 170 mg  170 mg Oral Daily Cobos, Rockey Situ, MD   170 mg at 09/14/17 0809  . multivitamin with minerals tablet 1 tablet  1 tablet Oral Daily Cobos, Rockey Situ, MD   1 tablet at 09/14/17 0805  . nicotine (NICODERM CQ - dosed in mg/24 hours) patch 21 mg  21 mg Transdermal Daily Cobos, Rockey Situ, MD   21 mg at 09/14/17 0805  . ondansetron (ZOFRAN-ODT) disintegrating tablet 4 mg  4 mg Oral Q6H PRN Cobos, Rockey Situ, MD   4 mg at 09/13/17 1017  .  pneumococcal 23 valent vaccine (PNU-IMMUNE) injection 0.5 mL  0.5 mL Intramuscular Tomorrow-1000 Cobos, Fernando A, MD      . thiamine (B-1) injection 100 mg  100 mg Intramuscular Once Cobos, Fernando A, MD      . thiamine (VITAMIN B-1) tablet 100 mg  100 mg Oral Daily Cobos, Rockey Situ, MD   100 mg at 09/14/17 0805  . traZODone (DESYREL) tablet 50 mg  50 mg Oral QHS PRN Beryle Lathe, Justina A, NP   50 mg at 09/13/17 2211   PTA Medications: Medications Prior to Admission  Medication Sig Dispense Refill Last Dose  . ARIPiprazole (ABILIFY) 10 MG tablet Take 10 mg by mouth.   Taking  . busPIRone (BUSPAR) 10 MG tablet Take 10 mg by mouth.   Taking  . escitalopram (LEXAPRO) 20 MG tablet Take 1 tablet (20 mg total) by mouth at bedtime. For depression 30 tablet 0 Taking  . etodolac (LODINE) 400 MG tablet Take 1 tablet (400 mg total) by mouth 2 (two) times daily. 60 tablet 5   . gabapentin (NEURONTIN) 600 MG tablet Take 1 tablet (600 mg total) by mouth 3 (three) times  daily. 90 tablet 5   . hydrOXYzine (ATARAX/VISTARIL) 25 MG tablet Take 1 tablet (25 mg total) by mouth every 6 (six) hours as needed for anxiety. 60 tablet 0 Taking  . methadone (DOLOPHINE) 10 MG/ML solution Take 16 mLs (160 mg total) by mouth daily. For opioid addiction  0 Taking  . Multiple Vitamin (MULTIVITAMIN WITH MINERALS) TABS tablet Take 1 tablet by mouth 3 (three) times daily. For low Vitamin 1 tablet 0 Taking  . tizanidine (ZANAFLEX) 2 MG capsule Take 2 mg by mouth.   Taking  . traZODone (DESYREL) 100 MG tablet Take 1 tablet (100 mg total) by mouth at bedtime. For sleep 30 tablet 0 Taking    Patient Stressors: Health problems Marital or family conflict Substance abuse  Patient Strengths: Capable of independent living Wellsite geologist fund of knowledge Motivation for treatment/growth Work skills  Treatment Modalities: Medication Management, Group therapy, Case management,  1 to 1 session with clinician,  Psychoeducation, Recreational therapy.   Physician Treatment Plan for Primary Diagnosis: MDD  Long Term Goal(s): Improvement in symptoms so as ready for discharge Improvement in symptoms so as ready for discharge   Short Term Goals: Ability to identify changes in lifestyle to reduce recurrence of condition will improve Ability to maintain clinical measurements within normal limits will improve Ability to identify triggers associated with substance abuse/mental health issues will improve  Medication Management: Evaluate patient's response, side effects, and tolerance of medication regimen.  Therapeutic Interventions: 1 to 1 sessions, Unit Group sessions and Medication administration.  Evaluation of Outcomes: Progressing  Physician Treatment Plan for Secondary Diagnosis: Active Problems:   MDD (major depressive disorder)  Long Term Goal(s): Improvement in symptoms so as ready for discharge Improvement in symptoms so as ready for discharge   Short Term Goals: Ability to identify changes in lifestyle to reduce recurrence of condition will improve Ability to maintain clinical measurements within normal limits will improve Ability to identify triggers associated with substance abuse/mental health issues will improve     Medication Management: Evaluate patient's response, side effects, and tolerance of medication regimen.  Therapeutic Interventions: 1 to 1 sessions, Unit Group sessions and Medication administration.  Evaluation of Outcomes: Progressing   RN Treatment Plan for Primary Diagnosis: MDD  Long Term Goal(s): Knowledge of disease and therapeutic regimen to maintain health will improve  Short Term Goals: Ability to remain free from injury will improve, Ability to disclose and discuss suicidal ideas and Ability to identify and develop effective coping behaviors will improve  Medication Management: RN will administer medications as ordered by provider, will assess and evaluate  patient's response and provide education to patient for prescribed medication. RN will report any adverse and/or side effects to prescribing provider.  Therapeutic Interventions: 1 on 1 counseling sessions, Psychoeducation, Medication administration, Evaluate responses to treatment, Monitor vital signs and CBGs as ordered, Perform/monitor CIWA, COWS, AIMS and Fall Risk screenings as ordered, Perform wound care treatments as ordered.  Evaluation of Outcomes: Progressing   LCSW Treatment Plan for Primary Diagnosis: MDD  Long Term Goal(s): Safe transition to appropriate next level of care at discharge, Engage patient in therapeutic group addressing interpersonal concerns.  Short Term Goals: Engage patient in aftercare planning with referrals and resources, Facilitate patient progression through stages of change regarding substance use diagnoses and concerns and Identify triggers associated with mental health/substance abuse issues  Therapeutic Interventions: Assess for all discharge needs, 1 to 1 time with Social worker, Explore available resources and support systems, Assess for  adequacy in community support network, Educate family and significant other(s) on suicide prevention, Complete Psychosocial Assessment, Interpersonal group therapy.  Evaluation of Outcomes: Progressing   Progress in Treatment: Attending groups: Yes. Participating in groups: Yes. Taking medication as prescribed: Yes. Toleration medication: Yes. Family/Significant other contact made: No, will contact:  family member if patient consents. Patient understands diagnosis: Yes. Discussing patient identified problems/goals with staff: Yes. Medical problems stabilized or resolved: Yes. Denies suicidal/homicidal ideation: Yes. Issues/concerns per patient self-inventory: No. Other: n/a   New problem(s) identified: No, Describe:  n/a  New Short Term/Long Term Goal(s): detox, medication management for mood stabilization;  elimination of SI thoughts; development of comprehensive mental wellness/sobriety plan.   Discharge Plan or Barriers: CSW assessing. Pt states that she has a bed at [facility] on Tuesday, 09/15/17.  Reason for Continuation of Hospitalization: Anxiety Depression Medication stabilization Withdrawal symptoms  Estimated Length of Stay: Tuesday, 09/15/17  Attendees: Patient: Carla Park 09/14/2017 9:00 AM  Physician: Dr. Altamese Riverside MD; Dr. Jama Flavors MD 09/14/2017 9:00 AM  Nursing: Foy Guadalajara RN; Patrice RN 09/14/2017 9:00 AM  RN Care Manager: Onnie Boer CM 09/14/2017 9:00 AM  Social Worker: Chartered loss adjuster, LCSW 09/14/2017 9:00 AM  Recreational Therapist: x 09/14/2017 9:00 AM  Other: Armandina Stammer NP; Hillery Jacks NP 09/14/2017 9:00 AM  Other:  09/14/2017 9:00 AM  Other: 09/14/2017 9:00 AM    Scribe for Treatment Team: Ledell Peoples Smart, LCSW 09/14/2017 9:00 AM

## 2017-09-14 NOTE — BHH Group Notes (Signed)
[facility] Mental Health Association Group Therapy 09/14/2017 1:15pm  Type of Therapy: Mental Health Association Presentation  Participation Level: Active  Participation Quality: Attentive  Affect: Appropriate  Cognitive: Oriented  Insight: Developing/Improving  Engagement in Therapy: Engaged  Modes of Intervention: Discussion, Education and Socialization  Summary of Progress/Problems: Mental Health Association (MHA) Speaker came to talk about his personal journey with mental health. The pt processed ways by which to relate to the speaker. MHA speaker provided handouts and educational information pertaining to groups and services offered by the [facility]. Pt was engaged in speaker's presentation and was receptive to resources provided.    [employer], LCSW 09/14/2017 2:02 PM

## 2017-09-14 NOTE — Progress Notes (Signed)
D: Pt was in her room upon initial approach.  Pt presents with anxious, irritable, depressed affect and mood.  She reports her day was "okay."  She discussed how the doctor "decided to extend me a little bit because I'm still having withdrawals from alcohol."  Pt complains of withdrawal symptom of anxiety, sweats and requests Ativan.  Explained to pt that medication is PRN for CIWA over 10, or more severe withdrawal.  Pt was irritable because she did not qualify for Ativan at the time.  Pt denies SI/HI, denies hallucinations.  Pt reports chronic pain of 5/10 with a pain goal of 4/10.   Pt attended evening group.    A: Introduced self to pt.  Actively listened to pt and offered support and encouragement. Medication administered per order.  PRN medication administered for anxiety.  PRN medication for pain was offered, pt declined.  Q15 minute safety checks maintained.  R: Pt is safe on the unit.  Pt is compliant with medications and she reports she will inform staff of needs and concerns.  Pt verbally contracts for safety.  Will continue to monitor and assess.

## 2017-09-14 NOTE — [facility] Group Notes (Addendum)
Pt attended spiritual care group on grief and loss facilitated by chaplain Burnis Kingfisher   Group opened with brief discussion and psycho-social ed around grief and loss in relationships and in relation to self - identifying life patterns, circumstances, changes that cause losses. Established group norm of speaking from own life experience. Group goal of establishing open and affirming space for members to share loss and experience with grief, normalize grief experience and provide psycho social education and grief support.   Carla Park was present and engaged throughout group.  Pulled from group at end to meet with treatment team.  Chaplain followed up with [facility] after group time, as group closed while Carla Park was out of room.  She reported feeling ok.  Stated though she shared a lot in group, she felt she had to "get it out."    Carla Park spoke of recent separation from spouse.  She moved to [facility] from [facility] with spouse and has few connections here.  States that spouse told her he was leaving, but does not want divorce.  She understands that he wants time away.  She resonated with other group members who described the difficulty of lingering feeling of failure.  Carla Park described feeling as though she has failed in this relationship.  Connected with idea of caring for herself in this time.  Stated she typically cares for others, but has difficulty accepting care for herself.  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    Has 38 year old son whom she worries about

## 2017-09-14 NOTE — Plan of Care (Signed)
  Progressing Medication: Compliance with prescribed medication regimen will improve 09/14/2017 1428 - Progressing by Lenord Fellers, RN

## 2017-09-14 NOTE — Progress Notes (Signed)
  Adult Psychoeducational Group Note  Date:  09/14/2017 Time:  4:42 PM  Group Topic/Focus:  BINGO/Relaxation Activity   Participation Level:  Active  Participation Quality:  Appropriate  Affect:  Appropriate  Cognitive:  Appropriate  Insight: Appropriate  Engagement in Group:  Engaged  Modes of Intervention:  Activity  Additional Comments:    Sila Sarsfield L 09/14/2017, 4:42 PM  

## 2017-09-14 NOTE — Progress Notes (Signed)
Patient ID: Carla Park, female   DOB: 05-15-1981, 37 y.o.   MRN: [id number]  Writer verified with [facility] [phone]) that patient is currently on dosage 170 mg of Methadone daily. Her last dosing in house was 09/10/17 but patient has "take homes" until 09/16/17. MD Cobos was notified.

## 2017-09-14 NOTE — Progress Notes (Signed)
Patient ID: Carla Park, female   DOB: 1981-04-07, 37 y.o.   MRN: [id number]  DAR: Pt. Denies SI/HI and A/V Hallucinations. She reports that her sleep last night was poor, her appetite is fair, her energy level is low, and her concentration is poor. She rates her depression level 4/10, her hopelessness level is 4/10, and her anxiety level is 5/10. Patient does report some generalized pain and a headache. She reports withdrawal symptoms including tremors, chilling, cravings, chilling, and agitation. PRN Ativan was administered to her based on her CIWA score prior to lunch. Support and encouragement provided to the patient. Scheduled medications administered to patient per physician's orders. Writer overrode pyxis this morning because it was going to give the Methadone dosage in 5 mg increments equalling 34 pills. Writer overrode with Donovan Kail RN verifying. Patient is seen in the milieu and is cooperative with staff. Q15 minute checks are maintained for safety.

## 2017-09-14 NOTE — Progress Notes (Signed)
Recreation Therapy Notes  Date: 09/14/17 Time: 0930 Location: 300 Hall Dayroom  Group Topic: Stress Management  Goal Area(s) Addresses:  Patient will verbalize importance of using healthy stress management.  Patient will identify positive emotions associated with healthy stress management.   Intervention: Stress Management  Activity : Meditation.  LRT played Park body scan meditation from the Calm app. The meditation allowed patients to take inventory of any sensations they may or may not have been feeling.  Education:  Stress Management, Discharge Planning.   Education Outcome: Acknowledges edcuation/In group clarification offered/Needs additional education  Clinical Observations/Feedback: Pt did not attend group.     Carla Park, LRT/CTRS         Carla Park 09/14/2017 12:05 PM 

## 2017-09-14 NOTE — Progress Notes (Signed)
Patient attended AA group meeting tonight.  

## 2017-09-14 NOTE — Progress Notes (Signed)
[facility] MD Progress Note  09/14/2017 3:26 PM Carla Park  MRN:  [id number]  Subjective: Carla Park reports, "I came to the hospital because I had relapsed on alcohol about a month ago after a 6 months sobriety & became suicidal. My husband of 10 years told me that he needed a separation from me to work on himself & his credit. He is my best friend. I did not know how to forge a head with life without him. So, I turn to alcohol to cope. Today, I'm kind of emotional. I'm also happy to be going to [facility] treatment"  Objective: 37 year old female , known to our unit from prior admission. Presented to ED voluntarily on 1/26 for worsening depression, suicidal ideations of cutting herself  , and daily alcohol consumption after relapsing on alcohol about one month ago. States she has been drinking about 6 glasses of wine per day. Endorses neuro-vegetative symptoms of depression as below. Denies psychotic symptoms. She describes significant psychosocial stressors, mainly marital discord and current separation process .  Principal Problem: Alcohol use disorder, moderate, dependence (HCC)  Diagnosis:   Patient Active Problem List   Diagnosis Date Noted  . Alcohol use disorder, moderate, dependence (HCC) [F10.20] 01/28/2017    Priority: High  . MDD (major depressive disorder) [F32.9] 09/12/2017  . Cervical radiculopathy at C6 [M54.12] 05/19/2017  . Hepatitis C infection [B19.20] 05/19/2017  . Multiple joint pain [M25.50] 02/06/2017  . 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 Memory loss [R41.3] 02/06/2017  . Family history of MS (multiple sclerosis) [Z82.0] 02/06/2017  . MDD (major depressive disorder), recurrent severe, without psychosis (HCC) [F33.2] 01/14/2017  . 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 Alcohol withdrawal (HCC) [F10.239] 05/29/2015  . Chronic pain [G89.29]   . Fibromyalgia [M79.7]   . 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 Migraines [G43.909] 11/18/2012  . RA (rheumatoid arthritis) (HCC) [M06.9] 11/18/2012   Total Time spent with patient: 25 minutes  Past Psychiatric History: See H&P.  Past Medical History:  Past Medical History:  Diagnosis Date  . Arthritis   . Chronic pain   . Depression   . ETOH abuse   . Fibromyalgia   . Hep C w/ coma, chronic (HCC)   . Hepatitis C   . Opiate abuse, continuous (HCC)    on Methadone currently  . Suicide attempt (HCC)    cut wrist  . Vision abnormalities     Past Surgical History:  Procedure Laterality Date  . ABDOMINAL HYSTERECTOMY    . APPENDECTOMY    . CESAREAN SECTION    . CHOLECYSTECTOMY    . KNEE SURGERY     Family History:  Family History  Problem Relation Age of Onset  . Multiple sclerosis Mother   . Hypertension Father    Family Psychiatric  History: See H&P.  Social History:  Social History   Substance and Sexual Activity  Alcohol Use Yes   Comment: 2-500 ml boxes daily at least     Social History   Substance and Sexual Activity  Drug Use No    Social History   Socioeconomic History  . Marital status: Married    Spouse name: None  . Number of children: None  . Years of education: None  . Highest education level: None  Social Needs  . Financial resource strain: None  . Food insecurity - worry: None  . Food insecurity -  inability: None  . Transportation needs - medical: None  . Transportation needs - non-medical: None  Occupational History  . None  Tobacco Use  . Smoking status: Current Every Day Smoker    Packs/day: 1.00    Types: Cigarettes  . Smokeless tobacco: Never Used  Substance and Sexual Activity  . Alcohol use: Yes    Comment: 2-500 ml boxes daily at least  . Drug use: No  . Sexual activity: Yes    Birth control/protection: Surgical  Other Topics Concern  . None  Social History Narrative  . None   Additional Social History:   Sleep: Good  Appetite:  Good  Current  Medications: Current Facility-Administered Medications  Medication Dose Route Frequency Provider Last Rate Last Dose  . acetaminophen (TYLENOL) tablet 650 mg  650 mg Oral Q6H PRN Okonkwo, Justina A, NP   650 mg at 09/14/17 0904  . alum & mag hydroxide-simeth (MAALOX/MYLANTA) 200-200-20 MG/5ML suspension 30 mL  30 mL Oral Q4H PRN Okonkwo, Justina A, NP      . ARIPiprazole (ABILIFY) tablet 5 mg  5 mg Oral Daily Cobos, Rockey Situ, MD   5 mg at 09/14/17 0805  . cloNIDine (CATAPRES) tablet 0.1 mg  0.1 mg Oral BID Cobos, Rockey Situ, MD   0.1 mg at 09/14/17 0805  . DULoxetine (CYMBALTA) DR capsule 20 mg  20 mg Oral Daily Cobos, Rockey Situ, MD   20 mg at 09/14/17 0805  . gabapentin (NEURONTIN) capsule 200 mg  200 mg Oral TID Cobos, Rockey Situ, MD   200 mg at 09/14/17 1114  . hydrOXYzine (ATARAX/VISTARIL) tablet 25 mg  25 mg Oral Q6H PRN Cobos, Rockey Situ, MD   25 mg at 09/14/17 0905  . [START ON 09/15/2017] levothyroxine (SYNTHROID, LEVOTHROID) tablet 25 mcg  25 mcg Oral QAC breakfast Nwoko, Agnes I, NP      . loperamide (IMODIUM) capsule 2-4 mg  2-4 mg Oral PRN Cobos, Rockey Situ, MD      . LORazepam (ATIVAN) tablet 1 mg  1 mg Oral Q6H PRN Cobos, Rockey Situ, MD   1 mg at 09/14/17 1117  . magnesium hydroxide (MILK OF MAGNESIA) suspension 30 mL  30 mL Oral Daily PRN Okonkwo, Justina A, NP      . methadone (DOLOPHINE) 10 MG tablet           . methadone (DOLOPHINE) tablet 170 mg  170 mg Oral Daily Cobos, Rockey Situ, MD   170 mg at 09/14/17 0809  . multivitamin with minerals tablet 1 tablet  1 tablet Oral Daily Cobos, Rockey Situ, MD   1 tablet at 09/14/17 0805  . nicotine (NICODERM CQ - dosed in mg/24 hours) patch 21 mg  21 mg Transdermal Daily Cobos, Rockey Situ, MD   21 mg at 09/14/17 0805  . ondansetron (ZOFRAN-ODT) disintegrating tablet 4 mg  4 mg Oral Q6H PRN Cobos, Rockey Situ, MD   4 mg at 09/13/17 1017  . thiamine (B-1) injection 100 mg  100 mg Intramuscular Once Cobos, Fernando A, MD      . thiamine  (VITAMIN B-1) tablet 100 mg  100 mg Oral Daily Cobos, Rockey Situ, MD   100 mg at 09/14/17 0805  . traZODone (DESYREL) tablet 50 mg  50 mg Oral QHS PRN Beryle Lathe, Justina A, NP   50 mg at 09/13/17 2211   Lab Results:  Results for orders placed or performed during the hospital encounter of 09/12/17 (from the past 48 hour(s))  Lipid panel  Status: Abnormal   Collection Time: 09/14/17  6:22 AM  Result Value Ref Range   Cholesterol 213 (H) 0 - 200 mg/dL   Triglycerides 176 <160 mg/dL   HDL 86 >73 mg/dL   Total CHOL/HDL Ratio 2.5 RATIO   VLDL 22 0 - 40 mg/dL   LDL Cholesterol 710 (H) 0 - 99 mg/dL    Comment:        Total Cholesterol/HDL:CHD Risk Coronary Heart Disease Risk Table                     Men   Women  1/2 Average Risk   3.4   3.3  Average Risk       5.0   4.4  2 X Average Risk   9.6   7.1  3 X Average Risk  23.4   11.0        Use the calculated Patient Ratio above and the CHD Risk Table to determine the patient's CHD Risk.        ATP III CLASSIFICATION (LDL):  <100     mg/dL   Optimal  626-948  mg/dL   Near or Above                    Optimal  130-159  mg/dL   Borderline  546-270  mg/dL   High  >350     mg/dL   Very High Performed at [facility], [address], [place], [place] [address]   Hemoglobin A1c     Status: None   Collection Time: 09/14/17  6:22 AM  Result Value Ref Range   Hgb A1c MFr Bld 4.8 4.8 - 5.6 %    Comment: (NOTE) Pre diabetes:          5.7%-6.4% Diabetes:              >6.4% Glycemic control for   <7.0% adults with diabetes    Mean Plasma Glucose 91.06 mg/dL    Comment: Performed at [facility], [address] [place], [place] [address]  TSH     Status: Abnormal   Collection Time: 09/14/17  6:22 AM  Result Value Ref Range   TSH 9.690 (H) 0.350 - 4.500 uIU/mL    Comment: Performed by a 3rd Generation assay with a functional sensitivity of <=0.01 uIU/mL. Performed at [facility], [address] [place], [place] [address]    Blood Alcohol level:  Lab Results  Component Value Date   ETH 152 (H) 09/12/2017   ETH 167 (H) 02/10/2017   Metabolic Disorder Labs: Lab Results  Component Value Date   HGBA1C 4.8 09/14/2017   MPG 91.06 09/14/2017   No results found for: PROLACTIN Lab Results  Component Value Date   CHOL 213 (H) 09/14/2017   TRIG 108 09/14/2017   HDL 86 09/14/2017   CHOLHDL 2.5 09/14/2017   VLDL 22 09/14/2017   LDLCALC 105 (H) 09/14/2017   Physical Findings:  AIMS: Facial and Oral Movements Muscles of Facial Expression: None, normal Lips and Perioral Area: None, normal Jaw: None, normal Tongue: None, normal,Extremity Movements Upper (arms, wrists, hands, fingers): None, normal Lower (legs, knees, ankles, toes): None, normal, Trunk Movements Neck, shoulders, hips: None, normal, Overall Severity Severity of abnormal movements (highest score from questions above): None, normal Incapacitation due to abnormal movements: None, normal Patient's awareness of abnormal movements (rate only patient's report): No Awareness, Dental Status Current problems with teeth and/or  dentures?: No Does patient usually wear dentures?: No  CIWA:  CIWA-Ar Total: 12 COWS:     Musculoskeletal: Strength & Muscle Tone: within normal limits Gait & Station: normal Patient leans: N/A  Psychiatric Specialty Exam: Physical Exam  Constitutional: She is oriented to person, place, and time. She appears well-developed and well-nourished.  HENT:  Head: Normocephalic and atraumatic.  Eyes: Conjunctivae are normal. Pupils are equal, round, and reactive to light.  Neck: Normal range of motion. Neck supple.  Cardiovascular: Normal rate and regular rhythm.  Respiratory: Effort normal and breath sounds normal.  GI: Soft.  Musculoskeletal: Normal range of motion.  Neurological: She is alert and oriented to person, place, and time.  Skin: Skin is warm and dry.  Psychiatric:  As  above    ROS denies feeling sedated, denies chest pain, no dyspnea  Blood pressure (!) 146/101, pulse 78, temperature 97.8 F (36.6 C), temperature source Oral, resp. rate 20, height 5\' 2"  (1.575 m), weight 64.4 kg (142 lb), SpO2 100 %.Body mass index is 25.97 kg/m.  General Appearance: Fairly Groomed  Eye Contact:  Good  Speech:  Normal Rate  Volume:  Decreased  Mood: Depressed  Affect:  sad, constricted, anxious   Thought Process:  Linear and Descriptions of Associations: Intact  Orientation:  Other:  fully alert and attentive   Thought Content:  no hallucinations, no delusions, not internally preoccupied   Suicidal Thoughts:  No currently denies suicidal plan or intention, and contracts for safety on unit   Homicidal Thoughts:  No  Memory:  recent and remote grossly intact   Judgement:  Fair  Insight:  Fair  Psychomotor Activity:  Normal- subtle distal tremors, no diaphoresis or overt psychomotor restlessness   Concentration:  Concentration: Good and Attention Span: Good  Recall:  Good  Fund of Knowledge:  Good  Language:  Good  Akathisia:  Negative  Handed:  Right  AIMS (if indicated):     Assets:  Desire for Improvement Resilience  ADL's:  Intact  Cognition:  WNL  Sleep:  Number of Hours: 5.25     Treatment Plan Summary: Patient is currently improving compared to admission,  presenting with improving range of affect, however, tearful during this assessment for the what she thought was the loss of her relationship. Denies SI. She is tolerating medications well.  Continue inpatient hospitalization.  Will continue today 09/14/2017 plan as below except where it is noted.  Mood control.    - Continue Abilify 5 mg po daily.  Depression.    - Continue Cymbalta 20 mg po daily.   Agitation/anxiety.     - Continue Neurontin 200 mg po TID.     - Continue Hydroxyzine 25 mg prn Q 6 hours.  Nicotine withdrawal.     - Continue Nicotine patch 21 mg transdermally Q 24  hours.  Insomnia.     - Increased Trazodone from 50 mg po prn to 100 mg po routinely Q hs.  Continue all other home medications for the other medical issues per her outpatient provider's recommendation.  Treatment team working on disposition planning options.      - Continue to encourage group and milieu participation to work on coping skills and symptom reduction    09/16/2017, NP, PMHNP, FNP-BC. 09/14/2017, 3:26 PM  Patient ID: Cynai Skeens, female   DOB: [birth year], 37 y.o.   MRN: [id number]

## 2017-09-15 LAB — T3, FREE: T3, Free: 4 pg/mL (ref 2.0–4.4)

## 2017-09-15 MED ORDER — GABAPENTIN 300 MG PO CAPS
300.0000 mg | ORAL_CAPSULE | Freq: Three times a day (TID) | ORAL | Status: DC
  Filled 2017-09-15 (×7): qty 1

## 2017-09-15 MED ORDER — GABAPENTIN 300 MG PO CAPS: 300.0000 mg | ORAL_CAPSULE | Freq: Three times a day (TID) | ORAL | 0 refills | Status: DC

## 2017-09-15 MED ORDER — GABAPENTIN 300 MG PO CAPS: 600.0000 mg | ORAL_CAPSULE | Freq: Three times a day (TID) | ORAL | 0 refills | Status: DC

## 2017-09-15 MED ORDER — LEVOTHYROXINE SODIUM 25 MCG PO TABS: 25.0000 ug | ORAL_TABLET | Freq: Every day | ORAL | 0 refills | Status: DC

## 2017-09-15 MED ORDER — GABAPENTIN 300 MG PO CAPS
600.0000 mg | ORAL_CAPSULE | Freq: Three times a day (TID) | ORAL | Status: DC
  Administered 2017-09-15 – 2017-09-16 (×2): 600 mg via ORAL
  Filled 2017-09-15 (×6): qty 2

## 2017-09-15 MED ORDER — GABAPENTIN 100 MG PO CAPS
ORAL_CAPSULE | ORAL | Status: AC
  Filled 2017-09-15: qty 1

## 2017-09-15 MED ORDER — CLONIDINE HCL 0.1 MG PO TABS: 0.1000 mg | ORAL_TABLET | Freq: Three times a day (TID) | ORAL | 0 refills | Status: DC

## 2017-09-15 MED ORDER — HYDROXYZINE HCL 25 MG PO TABS
ORAL_TABLET | ORAL | Status: AC
  Filled 2017-09-15: qty 1

## 2017-09-15 MED ORDER — CLONIDINE HCL 0.1 MG PO TABS: 0.1000 mg | ORAL_TABLET | Freq: Two times a day (BID) | ORAL | 0 refills | Status: DC

## 2017-09-15 MED ORDER — HYDROXYZINE HCL 25 MG PO TABS
25.0000 mg | ORAL_TABLET | Freq: Once | ORAL | Status: AC
  Administered 2017-09-15: 25 mg via ORAL

## 2017-09-15 MED ORDER — GABAPENTIN 100 MG PO CAPS
100.0000 mg | ORAL_CAPSULE | Freq: Once | ORAL | Status: AC
  Administered 2017-09-15: 100 mg via ORAL

## 2017-09-15 MED ORDER — TRAZODONE HCL 100 MG PO TABS: 100.0000 mg | ORAL_TABLET | Freq: Every day | ORAL | 0 refills | Status: DC

## 2017-09-15 MED ORDER — ARIPIPRAZOLE 5 MG PO TABS: 5.0000 mg | ORAL_TABLET | Freq: Every day | ORAL | 0 refills | Status: DC

## 2017-09-15 MED ORDER — DULOXETINE HCL 20 MG PO CPEP: 20.0000 mg | ORAL_CAPSULE | Freq: Every day | ORAL | 0 refills | Status: DC

## 2017-09-15 MED ORDER — HYDROXYZINE HCL 50 MG PO TABS: ORAL_TABLET | ORAL | 0 refills | Status: DC

## 2017-09-15 MED ORDER — METHADONE HCL 10 MG/ML PO CONC
160.0000 mg | Freq: Every day | ORAL | 0 refills | Status: DC
Start: 2017-09-15 — End: 2018-01-27

## 2017-09-15 MED ORDER — ADULT MULTIVITAMIN W/MINERALS CH: 1.0000 | ORAL_TABLET | Freq: Three times a day (TID) | ORAL | 0 refills | Status: DC

## 2017-09-15 MED ORDER — CLONIDINE HCL 0.1 MG PO TABS
0.1000 mg | ORAL_TABLET | Freq: Three times a day (TID) | ORAL | Status: DC
  Administered 2017-09-15 (×2): 0.1 mg via ORAL
  Filled 2017-09-15 (×7): qty 1

## 2017-09-15 MED ORDER — HYDROXYZINE HCL 50 MG PO TABS
50.0000 mg | ORAL_TABLET | Freq: Four times a day (QID) | ORAL | Status: DC | PRN
  Administered 2017-09-15: 50 mg via ORAL
  Filled 2017-09-15: qty 1

## 2017-09-15 MED ORDER — NICOTINE 21 MG/24HR TD PT24: 21.0000 mg | MEDICATED_PATCH | Freq: Every day | TRANSDERMAL | 0 refills | Status: DC

## 2017-09-15 NOTE — Plan of Care (Signed)
Patient has been attending groups and other unit activities. Patient has been taking medications as prescribed. No injury, either self inflicted or otherwise has been noted, or reported this shift.

## 2017-09-15 NOTE — [facility] Group Notes (Signed)
[facility] Group Notes:  (Nursing/MHT/Case Management/Adjunct)  Date:  09/15/2017  Time:  4:20 PM  Type of Therapy:  Psychoeducational Skills  Participation Level:  Active  Participation Quality:  Appropriate  Affect:  Appropriate  Cognitive:  Appropriate  Insight:  Appropriate  Engagement in Group:  Engaged  Modes of Intervention:  Activity and Education  Summary of Progress/Problems:  Nurse taught participants how to use deep breathing and short guided imagery sessions, on their own, specifically how these pertain to small time spans and stressful situations, in their every day lives.  Almira Bar 09/15/2017, 4:20 PM

## 2017-09-15 NOTE — Progress Notes (Signed)
Adult Psychoeducational Group Note  Date:  09/15/2017 Time:  6:23 PM  Group Topic/Focus:  Recovery Goals:   The focus of this group is to identify appropriate goals for recovery and establish a plan to achieve them.  Participation Level:  Active  Participation Quality:  Appropriate  Affect:  Appropriate  Cognitive:  Appropriate  Insight: Appropriate  Engagement in Group:  Engaged  Modes of Intervention:  Discussion  Additional Comments:  Pt did attend group and actively participated in defining ways to prevent relapse.  Wynema Birch D 09/15/2017, 6:23 PM

## 2017-09-15 NOTE — BHH Suicide Risk Assessment (Signed)
[facility] Discharge Suicide Risk Assessment   Principal Problem: MDD (major depressive disorder) Discharge Diagnoses:  Patient Active Problem List   Diagnosis Date Noted  . MDD (major depressive disorder) [F32.9] 09/12/2017  . Cervical radiculopathy at C6 [M54.12] 05/19/2017  . Hepatitis C infection [B19.20] 05/19/2017  . Multiple joint pain [M25.50] 02/06/2017  . 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 Memory loss [R41.3] 02/06/2017  . Family history of MS (multiple sclerosis) [Z82.0] 02/06/2017  . Alcohol use disorder, moderate, dependence (HCC) [F10.20] 01/28/2017  . MDD (major depressive disorder), recurrent severe, without psychosis (HCC) [F33.2] 01/14/2017  . 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 Alcohol withdrawal (HCC) [F10.239] 05/29/2015  . Chronic pain [G89.29]   . Fibromyalgia [M79.7]   . 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 Migraines [G43.909] 11/18/2012  . RA (rheumatoid arthritis) (HCC) [M06.9] 11/18/2012    Total Time spent with patient: 30 minutes  Musculoskeletal: Strength & Muscle Tone: within normal limits Gait & Station: normal Patient leans: Backward  Psychiatric Specialty Exam: Review of Systems  Constitutional: Negative for chills and fever.  Respiratory: Negative for cough and shortness of breath.   Cardiovascular: Negative for chest pain.  Gastrointestinal: Negative for abdominal pain, heartburn, nausea and vomiting.  Psychiatric/Behavioral: Negative for depression, hallucinations and suicidal ideas. The patient is nervous/anxious. The patient does not have insomnia.     Blood pressure (!) 147/96, pulse 91, temperature 98.2 F (36.8 C), temperature source Oral, resp. rate 16, height 5\' 2"  (1.575 m), weight 64.4 kg (142 lb), SpO2 100 %.Body mass index is 25.97 kg/m.  General Appearance: Casual and Fairly Groomed  ::  Good  Speech:  Clear and Coherent and Normal Rate  Volume:  Normal   Mood:  Anxious  Affect:  Appropriate, Congruent, Constricted and Tearful  Thought Process:  Coherent and Goal Directed  Orientation:  Full (Time, Place, and Person)  Thought Content:  Logical  Suicidal Thoughts:  No  Homicidal Thoughts:  No  Memory:  Immediate;   Fair Recent;   Fair Remote;   Fair  Judgement:  Fair  Insight:  Fair  Psychomotor Activity:  Normal  Concentration:  Fair  Recall:  [phone] of Knowledge:Fair  Language: Fair  Akathisia:  No  Handed:    AIMS (if indicated):     Assets:  Communication Skills Physical Health Resilience  Sleep:  Number of Hours: 6.75  Cognition: WNL  ADL's:  Intact   Mental Status Per Nursing Assessment::   On Admission:  Self-harm thoughts  Demographic Factors:  Caucasian, Low socioeconomic status and Unemployed  Loss Factors: Loss of significant relationship and Financial problems/change in socioeconomic status  Historical Factors: Impulsivity  Risk Reduction Factors:   Positive social support, Positive therapeutic relationship and Positive coping skills or problem solving skills  Continued Clinical Symptoms:  Depression:   Comorbid alcohol abuse/dependence Alcohol/Substance Abuse/Dependencies More than one psychiatric diagnosis Unstable or Poor Therapeutic Relationship Previous Psychiatric Diagnoses and Treatments  Cognitive Features That Contribute To Risk:  None    Suicide Risk:  Minimal: No identifiable suicidal ideation.  Patients presenting with no risk factors but with morbid ruminations; may be classified as minimal risk based on the severity of the depressive symptoms  Follow-up Information    [facility] Follow up on 09/16/2017.   Why:  You have been accepted for treatment on Wed 1/30. Driver will pick you up from your home in the morning  and will call when he is on his way. Thank you.  Contact information: [address] [place] [address] [phone]         Subjective  Data:  Carla Park is a 37 y/o F with history of MDD and alcohol use disorder who was admitted with worsening depression, SI with plan to cut herself, and worsening use of alcohol for the past month after relapse (previous period of sobriety was 6 months). Pt was started on CIWA as well as combination of cymbalta and abilify. She has worked with SW team to arrange for substance use treatment at [facility] [place] starting on 09/16/17.   Today upon evaluation, pt is tearful and anxious. She reports continued anxiety and difficulty with physical discomfort from withdrawal of alcohol. She denies SI/HI/AH/VH. She is sleeping adequately and her appetite is fair. She reports that she had been taking gabapentin 600mg  TID prior to her admission as well as clonidine 0.1mg  QID, and she would like to restart on these medications. She is still in agreement with plan to discharge early in the AM with plan to go to [facility] after she is picked up by her family. She was able to engage in safety planning including plan to return to [facility] or contact emergency services if she feels unable to maintain her own safety or the safety of others. Pt had no further questions, comments, or concerns.   Plan Of Care/Follow-up recommendations:   - Discharge to outpatient level of care in AM (Pt will be picked up by family and taking directly to [facility] [place])   -MDD    - Continue Abilify 5 mg po daily    - Continue Cymbalta 20 mg po daily   -Anxiety     - Change Neurontin 200 mg po TID to Neurontin 600mg  TID      - Change clonidine 0.1mg  BID to clonidine 0.1mg  QID     - Continue Hydroxyzine 50 mg prn Q 6 hours.  -Insomnia.     - ContinueTrazodone100 mg po qhs  Continue all other home medications for the other medical issues per her outpatient provider's recommendation.  Activity:  as tolerated Diet:  normal Tests:  NA Other:  see above for DC plan  [facility], MD 09/15/2017,  3:12 PM

## 2017-09-15 NOTE — Progress Notes (Signed)
D: Pt was in the dayroom upon initial approach.  Pt presents with anxious affect and mood.  She reports she is "stressed out" and her goal is "to prepare myself for [place]."  She reports she feels safe to discharge tomorrow.  She was seen smiling at times while talking to peers.  Pt denies SI/HI, denies hallucinations, denies pain.  Pt has been visible in milieu interacting with peers and staff appropriately.  Pt attended evening group.    A: Introduced self to pt.  Actively listened to pt and offered support and encouragement. Medications administered per order.  Q15 minute safety checks maintained.  R: Pt is safe on the unit.  Pt is compliant with medications.  Pt verbally contracts for safety.  Will continue to monitor and assess.

## 2017-09-15 NOTE — Discharge Summary (Signed)
Physician Discharge Summary Note  Patient:  Carla Park is an 37 y.o., female MRN:  [id number] DOB:  04-Nov-1980 Patient phone:  [phone] (home)  Patient address:   [address] [place] [address],  Total Time spent with patient: Greater than 30 minutres  Date of Admission:  09/12/2017 Date of Discharge: 09-16-17  Reason for Admission: Suicidal ideations with plans to cut her wrist due to worsening depression due to her problem with alcoholism.  Principal Problem: MDD (major depressive disorder)  Discharge Diagnoses: Patient Active Problem List   Diagnosis Date Noted  . Alcohol use disorder, moderate, dependence ([place]) [F10.20] 01/28/2017    Priority: High  . MDD (major depressive disorder) [F32.9] 09/12/2017  . Cervical radiculopathy at C6 [M54.12] 05/19/2017  . Hepatitis C infection [B19.20] 05/19/2017  . Multiple joint pain [M25.50] 02/06/2017  . 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 Memory loss [R41.3] 02/06/2017  . Family history of MS (multiple sclerosis) [Z82.0] 02/06/2017  . MDD (major depressive disorder), recurrent severe, without psychosis ([place]) [F33.2] 01/14/2017  . Long-term current use of methadone for opiate dependence ([place]) [F11.20] 06/18/2015  . Alcohol withdrawal ([place]) [F10.239] 05/29/2015  . Chronic pain [G89.29]   . Fibromyalgia [M79.7]   . Hep C w/ coma, chronic ([place]) [B18.2]   . Migraines [G43.909] 11/18/2012  . RA (rheumatoid arthritis) ([place]) [M06.9] 11/18/2012   Past Psychiatric History: Alcohol use disorder, MDD  Past Medical History:  Past Medical History:  Diagnosis Date  . Arthritis   . Chronic pain   . Depression   . ETOH abuse   . Fibromyalgia   . Hep C w/ coma, chronic ([place])   . Hepatitis C   . Opiate abuse, continuous ([place])    on Methadone currently  . Suicide attempt ([place])    cut wrist  . Vision abnormalities     Past Surgical History:  Procedure Laterality Date  . ABDOMINAL HYSTERECTOMY    . APPENDECTOMY     . CESAREAN SECTION    . CHOLECYSTECTOMY    . KNEE SURGERY     Family History:  Family History  Problem Relation Age of Onset  . Multiple sclerosis Mother   . Hypertension Father    Family Psychiatric  History: See H&P  Social History:  Social History   Substance and Sexual Activity  Alcohol Use Yes   Comment: 2-500 ml boxes daily at least     Social History   Substance and Sexual Activity  Drug Use No    Social History   Socioeconomic History  . Marital status: Married    Spouse name: None  . Number of children: None  . Years of education: None  . Highest education level: None  Social Needs  . Financial resource strain: None  . Food insecurity - worry: None  . Food insecurity - inability: None  . Transportation needs - medical: None  . Transportation needs - non-medical: None  Occupational History  . None  Tobacco Use  . Smoking status: Current Every Day Smoker    Packs/day: 1.00    Types: Cigarettes  . Smokeless tobacco: Never Used  Substance and Sexual Activity  . Alcohol use: Yes    Comment: 2-500 ml boxes daily at least  . Drug use: No  . Sexual activity: Yes    Birth control/protection: Surgical  Other Topics Concern  . None  Social History Narrative  . None   Hospital Course:  (Per admission assessment): Carla Park is a 37 year  old female , known to our unit from prior admission. Presented to ED voluntarily on 1/26 for worsening depression, suicidal ideations of cutting herself , and daily alcohol consumption after relapsing on alcohol about one month ago.  States she has been drinking about 6 glasses of wine per day. Endorses neuro-vegetative symptoms of depression as below. Denies psychotic symptoms.She describes significant psychosocial stressors, mainly marital discord and current separation process. She also reports she has not been taking her psychiatric medications as regularly as before ( Lexapro, Abilify , Buspar). Of note, patient reports she is  on methadone maintenance at [facility]. States she is on Methadone 170 mrs daily , which she states she is taking  regularly and up to yesterday , when she presented to ED .   This is one of several discharge summaries for Carla Park from this hospital alone. She is known on this unit from previous admissions all related to substance abuse issues & the need for mood stabilization treatments due to worsening depression. She was discharged from this hospital last June, 2018 after receiving treatment for worsening depression & suicidal ideations. This time around around, she came to the hospital with similar complaints as the previous times after relapsing on alcohol & started to drink heavily again after 6 months sobriety. She cited marital separation as the trigger. Her BAL on admission was 152 per toxicology test reports.  After the above admission assessment, Carla Park was admitted for alcohol detoxification/mood stabilization treatments. She received a brief Ativan detox regimen. She was also medicated & discharged on; Abilify 5 mg for mood control, Duloxetine 20 mg for depression, Gabapentin 600 mg for agitation/substance withdrawal syndrome, Hydroxyzine 50 mg prn for anxiety, Nicotine patch 21 mg for smoking cessation & Trazodone 100 mg for insomnia. She was resumed on all her pertinent home medications for the other pre-existing medical issues presented. Carla Park tolerated her treatment regimen without any adverse effects or reactions reported. She was enrolled in the group counseling sessions being offered & held on this unit. She learned coping skills.  Carla Park is seen today by her attending psychiatrist for discharge eveluation. She stated that she is pleased that she sought help again. Says she is tolerating her medications well. No withdrawal symptoms. No craving for substances. She is no longer feeling depressed. Describes normal energy and ability to think. Able to focus on task at hand,  getting to be sober again. No suicidal thoughts. No homicidal thoughts. No thoughts of violence. Patient reports normal biological functions.   The nursing staff reports that Ainhoa has been appropriate on the unit. Patient has been interacting well with peers & staff. No behavioral issues. Patient has not voiced any suicidal thoughts. Patient has not been observed to be internally stimulated or pre-occupied. Patient has been adherent to her treatment recommendations. Patient has been tolerating her medication well, denies any adverse reactions or side effects.   Patient was discussed at the team meeting this morning. The team members feel that patient is back to her baseline level of function. The team agrees with plan to discharge patient today to continue mental health care on outpatient basis & further substance abuse treatment at the [place] in [place] as noted below. She was provided with all the necessary information needed to make this appointment without problems. Upon discharge, Amiyah adamantly denies any SIHI, AVH, delusional thoughts, paranoia or substance withdrawal symptoms. She left [facility] with all personal belongings in no apparent distress. Transportation per husband.  Physical Findings:  AIMS: Facial and Oral Movements Muscles of Facial Expression: None, normal Lips and Perioral Area: None, normal Jaw: None, normal Tongue: None, normal,Extremity Movements Upper (arms, wrists, hands, fingers): None, normal Lower (legs, knees, ankles, toes): None, normal, Trunk Movements Neck, shoulders, hips: None, normal, Overall Severity Severity of abnormal movements (highest score from questions above): None, normal Incapacitation due to abnormal movements: None, normal Patient's awareness of abnormal movements (rate only patient's report): No Awareness, Dental Status Current problems with teeth and/or dentures?: No Does patient usually wear dentures?: No  CIWA:  CIWA-Ar Total:  8 COWS:     Musculoskeletal: Strength & Muscle Tone: within normal limits Gait & Station: normal Patient leans: N/A  Psychiatric Specialty Exam: Physical Exam  Constitutional: She is oriented to person, place, and time. She appears well-developed.  HENT:  Head: Normocephalic.  Eyes: Pupils are equal, round, and reactive to light.  Neck: Normal range of motion.  Cardiovascular: Normal rate.  Respiratory: Effort normal.  GI: Soft.  Genitourinary:  Genitourinary Comments: Deferred  Musculoskeletal: Normal range of motion.  Neurological: She is alert and oriented to person, place, and time.  Skin: Skin is warm and dry.    Review of Systems  Constitutional: Negative.   HENT: Negative.   Eyes: Negative.   Respiratory: Negative.   Cardiovascular: Negative.   Gastrointestinal: Negative.   Genitourinary: Negative.   Musculoskeletal: Negative.   Skin: Negative.   Neurological: Negative.   Endo/Heme/Allergies: Negative.   Psychiatric/Behavioral: Positive for depression (Stable). Negative for suicidal ideas.    Blood pressure (!) 147/96, pulse 91, temperature 98.2 F (36.8 C), temperature source Oral, resp. rate 16, height _0  (1.575 m), weight 64.4 kg (142 lb), SpO2 100 %.Body mass index is 25.97 kg/m.  See Md's SRA   Have you used any form of tobacco in the last 30 days? (Cigarettes, Smokeless Tobacco, Cigars, and/or Pipes): Yes  Has this patient used any form of tobacco in the last 30 days? (Cigarettes, Smokeless Tobacco, Cigars, and/or Pipes):Yes, provided with Nicotine patch prescription to assist with smoking cessation.  Blood Alcohol level:  Lab Results  Component Value Date   ETH 152 (H) 09/12/2017   ETH 167 (H) 88/32/5498   Metabolic Disorder Labs:  Lab Results  Component Value Date   HGBA1C 4.8 09/14/2017   MPG 91.06 09/14/2017   No results found for: PROLACTIN Lab Results  Component Value Date   CHOL 213 (H) 09/14/2017   TRIG 108 09/14/2017   HDL 86  09/14/2017   CHOLHDL 2.5 09/14/2017   VLDL 22 09/14/2017   LDLCALC 105 (H) 09/14/2017    See Psychiatric Specialty Exam and Suicide Risk Assessment completed by Attending Physician prior to discharge.  Discharge destination:  Home  Is patient on multiple antipsychotic therapies at discharge:  No   Has Patient had three or more failed trials of antipsychotic monotherapy by history:  No  Recommended Plan for Multiple Antipsychotic Therapies: NA Discharge Instructions    Diet - low sodium heart healthy   Complete by:  As directed    Increase activity slowly   Complete by:  As directed      Allergies as of 09/15/2017      Reactions   Erythromycin Hives   Lamictal [lamotrigine] Dermatitis   Sumatriptan Other (See Comments)   Becomes angry      Medication List    STOP taking these medications   busPIRone 10 MG tablet Commonly known as:  BUSPAR   escitalopram 20 MG tablet Commonly  known as:  LEXAPRO   etodolac 400 MG tablet Commonly known as:  LODINE   gabapentin 600 MG tablet Commonly known as:  NEURONTIN Replaced by:  gabapentin 300 MG capsule   tizanidine 2 MG capsule Commonly known as:  ZANAFLEX     TAKE these medications     Indication  ARIPiprazole 5 MG tablet Commonly known as:  ABILIFY Take 1 tablet (5 mg total) by mouth daily. For mood control Start taking on:  09/16/2017 What changed:    medication strength  how much to take  when to take this  additional instructions  Indication:  Mood control   cloNIDine 0.1 MG tablet Commonly known as:  CATAPRES Take 1 tablet (0.1 mg total) by mouth 4 (four) times daily - after meals and at bedtime. Anxiety & high blood pressure  Indication:  High Blood Pressure Disorder, Anxiety   DULoxetine 20 MG capsule Commonly known as:  CYMBALTA Take 1 capsule (20 mg total) by mouth daily. For depression Start taking on:  09/16/2017  Indication:  Major Depressive Disorder   gabapentin 300 MG capsule Commonly  known as:  NEURONTIN Take 2 capsules (600 mg total) by mouth 3 (three) times daily. For agitation Start taking on:  09/16/2017 Replaces:  gabapentin 600 MG tablet  Indication:  Agitation   hydrOXYzine 50 MG tablet Commonly known as:  ATARAX/VISTARIL Take 1 tablet (50 mg) by mouth every 6 hours as needed: For anxiety What changed:    medication strength  how much to take  how to take this  when to take this  reasons to take this  additional instructions  Indication:  Feeling Anxious   levothyroxine 25 MCG tablet Commonly known as:  SYNTHROID, LEVOTHROID Take 1 tablet (25 mcg total) by mouth daily before breakfast. For low thyroid function Start taking on:  09/16/2017  Indication:  Underactive Thyroid   methadone 10 MG/ML solution Commonly known as:  DOLOPHINE Take 16 mLs (160 mg total) by mouth daily. For opioid addiction  Indication:  Opioid Dependence   multivitamin with minerals Tabs tablet Take 1 tablet by mouth 3 (three) times daily. For low Vitamin  Indication:  Vitamin supplement   nicotine 21 mg/24hr patch Commonly known as:  NICODERM CQ - dosed in mg/24 hours Place 1 patch (21 mg total) onto the skin daily. For smoking cessation Start taking on:  09/16/2017  Indication:  Nicotine Addiction   traZODone 100 MG tablet Commonly known as:  DESYREL Take 1 tablet (100 mg total) by mouth at bedtime. For sleep  Indication:  [place] Follow up on 09/16/2017.   Why:  You have been accepted for treatment on Wed 1/30. Driver will pick you up from your home in the morning and will call when he is on his way. Thank you.  Contact information: [address] [place] [address] [phone]          Follow-up recommendations: Activity:  As tolerated Diet: As recommended by your primary care doctor. Keep all scheduled follow-up appointments as recommended.   Comments: Patient is instructed prior to  discharge to: Take all medications as prescribed by his/her mental healthcare provider. Report any adverse effects and or reactions from the medicines to his/her outpatient provider promptly. Patient has been instructed & cautioned: To not engage in alcohol and or illegal drug use while on prescription medicines. In the event of worsening symptoms, patient is instructed to  call the crisis hotline, 911 and or go to the nearest ED for appropriate evaluation and treatment of symptoms. To follow-up with his/her primary care provider for your other medical issues, concerns and or health care needs.   Signed: Lindell Spar, NP, PMHNP, FNP-BC 09/15/2017, 4:46 PM   Patient seen, Suicide Assessment Completed.  Disposition Plan Reviewed   Davielle Lingelbach is a 37 y/o F with history of MDD and alcohol use disorder who was admitted with worsening depression, SI with plan to cut herself, and worsening use of alcohol for the past month after relapse (previous period of sobriety was 6 months). Pt was started on CIWA as well as combination of cymbalta and abilify. She has worked with SW team to arrange for substance use treatment at [facility] [place] starting on 09/16/17.   Today upon evaluation, pt is tearful and anxious. She reports continued anxiety and difficulty with physical discomfort from withdrawal of alcohol. She denies SI/HI/AH/VH. She is sleeping adequately and her appetite is fair. She reports that she had been taking gabapentin 623m TID prior to her admission as well as clonidine 0.145mQID, and she would like to restart on these medications. She is still in agreement with plan to discharge early in the AM [facility]. She was able to engage in safety planning [facility] if she feels unable to maintain her own safety or the safety of others. Pt had no further questions, comments, or  concerns.  Plan Of Care/Follow-up recommendations:   - Discharge to outpatient level of care in AM (Pt will be picked up by family and [facility] [place]   -MDD - Continue Abilify 5 mg po daily - ContinueCymbalta20 mg po daily  -Anxiety - Change Neurontin200 mg poTID to Neurontin 60039mID                 - Change clonidine 0.1mg2mD to clonidine 0.1mg 97m - Continue Hydroxyzine 50 mg prn Q 6 hours.  -Insomnia. - ContinueTrazodone100 mg po qhs  Continueall other home medications for the other medical issues per her outpatient provider's recommendation.  Activity:  as tolerated Diet:  normal Tests:  NA Other:  see above for DC plan  ChrisPennelope Bracken

## 2017-09-15 NOTE — Progress Notes (Signed)
Pt requesting to discharge home to pack (discharge 8am on 1/30 Wed). Per Janelle Floor at [facility], a driver will pick her up late morning on Wed 1/30 from her home (per pt's request).   Trula Slade, MSW, LCSW Clinical Social Worker 09/15/2017 10:55 AM

## 2017-09-15 NOTE — [facility]HH Suicide Risk Assessment (Signed)
B[facility] INPATIENT:  Family/Significant Other Suicide Prevention Education  Suicide Prevention Education:  Patient Refusal for Family/Significant Other Suicide Prevention Education: The patient Carla Park has refused to provide written consent for family/significant other to be provided Family/Significant Other Suicide Prevention Education during admission and/or prior to discharge.  Physician notified.  SPE completed with pt, as pt refused to consent to family contact. SPI pamphlet provided to pt and pt was encouraged to share information with support network, ask questions, and talk about any concerns relating to SPE. Pt denies access to guns/firearms and verbalized understanding of information provided. Mobile Crisis information also provided to pt.   Genova Kiner N Smart LCSW 09/15/2017, 12:12 PM

## 2017-09-15 NOTE — [facility] Group Notes (Signed)
LCSW Group Therapy Note   09/15/2017 1:15pm   Type of Therapy and Topic:  Group Therapy:  Overcoming Obstacles   Participation Level:  Active   Description of Group:    In this group patients will be encouraged to explore what they see as obstacles to their own wellness and recovery. They will be guided to discuss their thoughts, feelings, and behaviors related to these obstacles. The group will process together ways to cope with barriers, with attention given to specific choices patients can make. Each patient will be challenged to identify changes they are motivated to make in order to overcome their obstacles. This group will be process-oriented, with patients participating in exploration of their own experiences as well as giving and receiving support and challenge from other group members.   Therapeutic Goals: 1. Patient will identify personal and current obstacles as they relate to admission. 2. Patient will identify barriers that currently interfere with their wellness or overcoming obstacles.  3. Patient will identify feelings, thought process and behaviors related to these barriers. 4. Patient will identify two changes they are willing to make to overcome these obstacles:      Summary of Patient Progress   Carla Park was attentive and engaged during today's processing group. She shared that her biggest obstacle involves "staying calm when I have to go home to pack my things tomorrow." PT reports that she and her husband are separating and she is going to [facility] to pursue further residential treatment. "it will also be hard to be away from my son, but I know I need to do this for myself and for him." Dimitra continues to show progress in the group setting with improving insight.    Therapeutic Modalities:   Cognitive Behavioral Therapy Solution Focused Therapy Motivational Interviewing Relapse Prevention Therapy  Ledell Peoples Smart, LCSW 09/15/2017 3:05 PM

## 2017-09-15 NOTE — Progress Notes (Signed)
  [facility] Adult Case Management Discharge Plan :  Will you be returning to the same living situation after discharge:  No. Pt going home for a few hours to pack bags. [facility] driver will pick her up at home in late morning.  At discharge, do you have transportation home?: Yes,  husband will pick her up at 8am on Wed 1/30.  Do you have the ability to pay for your medications: Yes,  bcbs insurance  Release of information consent forms completed and submitted to medical records by CSW.   Patient to Follow up at: Follow-up Information    [facility] Follow up on 09/16/2017.   Why:  You have been accepted for treatment on Wed 1/30. Driver will pick you up from your home in the morning and will call when he is on his way. Thank you.  Contact information: [address] [place] [address] [phone]           Next level of care provider has access to [facility] Link:no  Safety Planning and Suicide Prevention discussed: Yes,  SPE completed with pt; pt declined to consent to family contact. SPI pamphlet and Mobile Crisis information provided to pt.   Have you used any form of tobacco in the last 30 days? (Cigarettes, Smokeless Tobacco, Cigars, and/or Pipes): Yes  Has patient been referred to the Quitline?: Patient refused referral  Patient has been referred for addiction treatment: Yes  [employer], LCSW 09/15/2017, 12:12 PM

## 2017-09-16 MED ORDER — METHADONE HCL 10 MG PO TABS
170.0000 mg | ORAL_TABLET | Freq: Every day | ORAL | Status: DC
  Administered 2017-09-16: 170 mg via ORAL
  Filled 2017-09-16: qty 17

## 2017-09-16 NOTE — Progress Notes (Signed)
Discharge note:  Patient discharged home per MD order.  Patient received all personal belongings from unit and locker.  She denies any thoughts of self harm.  Reviewed AVS/transition record and she indicated understanding.  She will follow up with [facility].  Patient left ambulatory with her husband.  A representative with [facility] will pick patient up at her home.

## 2017-09-16 NOTE — Plan of Care (Signed)
  Completed/Met Activity: Interest or engagement in activities will improve 09/16/2017 0832 - Completed/Met by Joice Lofts, RN Sleeping patterns will improve 09/16/2017 [phone] - Completed/Met by Joice Lofts, RN Education: Knowledge of [place] Education information/materials will improve 09/16/2017 [phone] - Completed/Met by Joice Lofts, RN Emotional status will improve 09/16/2017 0832 - Completed/Met by Joice Lofts, RN Mental status will improve 09/16/2017 0832 - Completed/Met by Joice Lofts, RN Verbalization of understanding the information provided will improve 09/16/2017 0832 - Completed/Met by Joice Lofts, RN Coping: Ability to verbalize frustrations and anger appropriately will improve 09/16/2017 0832 - Completed/Met by Joice Lofts, RN Ability to demonstrate self-control will improve 09/16/2017 0832 - Completed/Met by Joice Lofts, [place] Behavior/Discharge Planning: Identification of resources available to assist in meeting health care needs will improve 09/16/2017 0832 - Completed/Met by Joice Lofts, RN Compliance with treatment plan for underlying cause of condition will improve 09/16/2017 0832 - Completed/Met by Joice Lofts, RN Physical Regulation: Ability to maintain clinical measurements within normal limits will improve 09/16/2017 0832 - Completed/Met by Joice Lofts, RN Safety: Periods of time without injury will increase 09/16/2017 0832 - Completed/Met by Joice Lofts, RN Education: Ability to make informed decisions regarding treatment will improve 09/16/2017 0832 - Completed/Met by Joice Lofts, RN Coping: Ability to cope will improve 09/16/2017 0832 - Completed/Met by Joice Lofts, [place] Behavior/Discharge Planning: Identification of resources available to assist in meeting health care needs will improve 09/16/2017 0832 - Completed/Met by Joice Lofts, RN Medication: Compliance with prescribed medication regimen will improve 09/16/2017 0832 - Completed/Met by Joice Lofts, RN Self-Concept: Ability to disclose and discuss suicidal ideas will improve 09/16/2017 0832 - Completed/Met by Joice Lofts, RN Ability to verbalize positive feelings about self will improve 09/16/2017 0832 - Completed/Met by Joice Lofts, RN Education: Knowledge of disease or condition will improve 09/16/2017 [phone] - Completed/Met by Joice Lofts, RN Understanding of discharge needs will improve 09/16/2017 0832 - Completed/Met by [place], [place] Behavior/Discharge Planning: Ability to identify changes in lifestyle to reduce recurrence of condition will improve 09/16/2017 7034 - Completed/Met by Joice Lofts, RN Identification of resources available to assist in meeting health care needs will improve 09/16/2017 0832 - Completed/Met by Joice Lofts, RN Physical Regulation: Complications related to the disease process, condition or treatment will be avoided or minimized 09/16/2017 0832 - Completed/Met by Joice Lofts, RN Safety: Ability to remain free from injury will improve 09/16/2017 0832 - Completed/Met by Joice Lofts, RN Spiritual Needs Ability to function at adequate level 09/16/2017 0832 - Completed/Met by Joice Lofts, RN

## 2017-11-17 ENCOUNTER — Ambulatory Visit: Payer: BLUE CROSS/BLUE SHIELD | Admitting: Neurology

## 2017-11-17 ENCOUNTER — Encounter: Payer: Self-pay | Admitting: Neurology

## 2018-01-21 ENCOUNTER — Inpatient Hospital Stay (HOSPITAL_COMMUNITY)
Admission: AD | Admit: 2018-01-21 | Discharge: 2018-01-27 | DRG: 885 | Disposition: A | Discharge status: Home / Self Care | Payer: BLUE CROSS/BLUE SHIELD | Source: Intra-hospital | Attending: Psychiatry | Admitting: Psychiatry

## 2018-01-21 ENCOUNTER — Other Ambulatory Visit: Payer: Self-pay

## 2018-01-21 ENCOUNTER — Emergency Department (HOSPITAL_COMMUNITY)
Admission: EM | Admit: 2018-01-21 | Discharge: 2018-01-21 | Disposition: A | Discharge status: Other Acute Inpatient Hospital | Payer: BLUE CROSS/BLUE SHIELD | Attending: Emergency Medicine | Admitting: Emergency Medicine

## 2018-01-21 ENCOUNTER — Encounter (HOSPITAL_COMMUNITY): Payer: Self-pay

## 2018-01-21 DIAGNOSIS — Z79899 Other long term (current) drug therapy: Secondary | ICD-10-CM | POA: Diagnosis not present

## 2018-01-21 DIAGNOSIS — R45851 Suicidal ideations: Secondary | ICD-10-CM | POA: Insufficient documentation

## 2018-01-21 DIAGNOSIS — F17210 Nicotine dependence, cigarettes, uncomplicated: Secondary | ICD-10-CM | POA: Diagnosis not present

## 2018-01-21 DIAGNOSIS — R450 Nervousness: Secondary | ICD-10-CM | POA: Diagnosis not present

## 2018-01-21 DIAGNOSIS — Z820 Family history of epilepsy and other diseases of the nervous system: Secondary | ICD-10-CM | POA: Diagnosis not present

## 2018-01-21 DIAGNOSIS — K5900 Constipation, unspecified: Secondary | ICD-10-CM | POA: Diagnosis not present

## 2018-01-21 DIAGNOSIS — F1020 Alcohol dependence, uncomplicated: Secondary | ICD-10-CM

## 2018-01-21 DIAGNOSIS — F1121 Opioid dependence, in remission: Secondary | ICD-10-CM | POA: Diagnosis present

## 2018-01-21 DIAGNOSIS — F332 Major depressive disorder, recurrent severe without psychotic features: Secondary | ICD-10-CM | POA: Diagnosis present

## 2018-01-21 DIAGNOSIS — F1120 Opioid dependence, uncomplicated: Secondary | ICD-10-CM | POA: Diagnosis not present

## 2018-01-21 DIAGNOSIS — F10239 Alcohol dependence with withdrawal, unspecified: Secondary | ICD-10-CM | POA: Diagnosis present

## 2018-01-21 DIAGNOSIS — R251 Tremor, unspecified: Secondary | ICD-10-CM | POA: Insufficient documentation

## 2018-01-21 DIAGNOSIS — Z811 Family history of alcohol abuse and dependence: Secondary | ICD-10-CM | POA: Diagnosis not present

## 2018-01-21 DIAGNOSIS — G4700 Insomnia, unspecified: Secondary | ICD-10-CM | POA: Diagnosis present

## 2018-01-21 DIAGNOSIS — Z818 Family history of other mental and behavioral disorders: Secondary | ICD-10-CM | POA: Diagnosis not present

## 2018-01-21 DIAGNOSIS — F1010 Alcohol abuse, uncomplicated: Secondary | ICD-10-CM | POA: Diagnosis not present

## 2018-01-21 DIAGNOSIS — F419 Anxiety disorder, unspecified: Secondary | ICD-10-CM | POA: Diagnosis present

## 2018-01-21 DIAGNOSIS — E039 Hypothyroidism, unspecified: Secondary | ICD-10-CM | POA: Diagnosis present

## 2018-01-21 DIAGNOSIS — Z8249 Family history of ischemic heart disease and other diseases of the circulatory system: Secondary | ICD-10-CM

## 2018-01-21 DIAGNOSIS — R454 Irritability and anger: Secondary | ICD-10-CM | POA: Diagnosis not present

## 2018-01-21 DIAGNOSIS — Z630 Problems in relationship with spouse or partner: Secondary | ICD-10-CM | POA: Diagnosis not present

## 2018-01-21 DIAGNOSIS — Z915 Personal history of self-harm: Secondary | ICD-10-CM | POA: Insufficient documentation

## 2018-01-21 LAB — RAPID URINE DRUG SCREEN, HOSP PERFORMED
Amphetamines: NOT DETECTED
Barbiturates: NOT DETECTED
Benzodiazepines: NOT DETECTED
Cocaine: NOT DETECTED
Opiates: NOT DETECTED
Tetrahydrocannabinol: NOT DETECTED

## 2018-01-21 LAB — COMPREHENSIVE METABOLIC PANEL
ALT: 131 U/L — ABNORMAL HIGH (ref 14–54)
AST: 126 U/L — ABNORMAL HIGH (ref 15–41)
Albumin: 4.1 g/dL (ref 3.5–5.0)
Alkaline Phosphatase: 243 U/L — ABNORMAL HIGH (ref 38–126)
Anion gap: 12 (ref 5–15)
BUN: 15 mg/dL (ref 6–20)
CO2: 29 mmol/L (ref 22–32)
Calcium: 9.2 mg/dL (ref 8.9–10.3)
Chloride: 94 mmol/L — ABNORMAL LOW (ref 101–111)
Creatinine, Ser: 0.99 mg/dL (ref 0.44–1.00)
GFR calc Af Amer: >60 mL/min (ref >60)
GFR calc non Af Amer: >60 mL/min (ref >60)
Glucose, Bld: 82 mg/dL (ref 65–99)
Potassium: 4.2 mmol/L (ref 3.5–5.1)
Sodium: 135 mmol/L (ref 135–145)
Total Bilirubin: 1.1 mg/dL (ref 0.3–1.2)
Total Protein: 7.7 g/dL (ref 6.5–8.1)

## 2018-01-21 LAB — CBC
HCT: 48.1 % — ABNORMAL HIGH (ref 36.0–46.0)
Hemoglobin: 16 g/dL — ABNORMAL HIGH (ref 12.0–15.0)
MCH: 30.4 pg (ref 26.0–34.0)
MCHC: 33.3 g/dL (ref 30.0–36.0)
MCV: 91.4 fL (ref 78.0–100.0)
Platelets: 146 10*3/uL — ABNORMAL LOW (ref 150–400)
RBC: 5.26 MIL/uL — ABNORMAL HIGH (ref 3.87–5.11)
RDW: 14.5 % (ref 11.5–15.5)
WBC: 7.2 10*3/uL (ref 4.0–10.5)

## 2018-01-21 LAB — I-STAT BETA HCG BLOOD, ED (MC, WL, AP ONLY): I-stat hCG, quantitative: <5 m[IU]/mL (ref <5)

## 2018-01-21 LAB — ETHANOL: Alcohol, Ethyl (B): <10 mg/dL (ref <10)

## 2018-01-21 LAB — ACETAMINOPHEN LEVEL: Acetaminophen (Tylenol), Serum: <10 ug/mL — ABNORMAL LOW (ref 10–30)

## 2018-01-21 LAB — SALICYLATE LEVEL: Salicylate Lvl: <7 mg/dL (ref 2.8–30.0)

## 2018-01-21 MED ORDER — LORAZEPAM 1 MG PO TABS: 1.0000 mg | ORAL_TABLET | Freq: Every day | ORAL | Status: DC

## 2018-01-21 MED ORDER — LORAZEPAM 2 MG/ML IJ SOLN: 0.0000 mg | Freq: Two times a day (BID) | INTRAMUSCULAR | Status: DC

## 2018-01-21 MED ORDER — LORAZEPAM 2 MG/ML IJ SOLN: 0.0000 mg | Freq: Four times a day (QID) | INTRAMUSCULAR | Status: DC

## 2018-01-21 MED ORDER — LORAZEPAM 1 MG PO TABS: 1.0000 mg | ORAL_TABLET | Freq: Four times a day (QID) | ORAL | Status: DC | PRN

## 2018-01-21 MED ORDER — LORAZEPAM 1 MG PO TABS: 0.0000 mg | ORAL_TABLET | Freq: Two times a day (BID) | ORAL | Status: DC

## 2018-01-21 MED ORDER — HYDROXYZINE HCL 50 MG PO TABS
50.0000 mg | ORAL_TABLET | Freq: Four times a day (QID) | ORAL | Status: DC | PRN
  Administered 2018-01-22 – 2018-01-26 (×2): 50 mg via ORAL
  Filled 2018-01-21 (×3): qty 1

## 2018-01-21 MED ORDER — ONDANSETRON 4 MG PO TBDP: 4.0000 mg | ORAL_TABLET | Freq: Four times a day (QID) | ORAL | Status: AC | PRN

## 2018-01-21 MED ORDER — ALUM & MAG HYDROXIDE-SIMETH 200-200-20 MG/5ML PO SUSP: 30.0000 mL | ORAL | Status: DC | PRN

## 2018-01-21 MED ORDER — GABAPENTIN 300 MG PO CAPS
600.0000 mg | ORAL_CAPSULE | Freq: Three times a day (TID) | ORAL | Status: DC
  Administered 2018-01-22 – 2018-01-27 (×17): 600 mg via ORAL
  Filled 2018-01-21 (×24): qty 2

## 2018-01-21 MED ORDER — DULOXETINE HCL 20 MG PO CPEP
20.0000 mg | ORAL_CAPSULE | Freq: Every day | ORAL | Status: DC
  Administered 2018-01-22 – 2018-01-25 (×4): 20 mg via ORAL
  Filled 2018-01-21 (×6): qty 1

## 2018-01-21 MED ORDER — THIAMINE HCL 100 MG/ML IJ SOLN: 100.0000 mg | Freq: Every day | INTRAMUSCULAR | Status: DC

## 2018-01-21 MED ORDER — LORAZEPAM 1 MG PO TABS: 1.0000 mg | ORAL_TABLET | Freq: Two times a day (BID) | ORAL | Status: DC

## 2018-01-21 MED ORDER — LEVOTHYROXINE SODIUM 25 MCG PO TABS
25.0000 ug | ORAL_TABLET | Freq: Every day | ORAL | Status: DC
  Administered 2018-01-22 – 2018-01-27 (×6): 25 ug via ORAL
  Filled 2018-01-21 (×9): qty 1

## 2018-01-21 MED ORDER — LORAZEPAM 1 MG PO TABS
0.0000 mg | ORAL_TABLET | Freq: Four times a day (QID) | ORAL | Status: DC
  Administered 2018-01-21: 1 mg via ORAL
  Filled 2018-01-21: qty 1

## 2018-01-21 MED ORDER — MAGNESIUM HYDROXIDE 400 MG/5ML PO SUSP
30.0000 mL | Freq: Every day | ORAL | Status: DC | PRN
  Administered 2018-01-22: 30 mL via ORAL
  Filled 2018-01-21: qty 30

## 2018-01-21 MED ORDER — VITAMIN B-1 100 MG PO TABS
100.0000 mg | ORAL_TABLET | Freq: Every day | ORAL | Status: DC
  Administered 2018-01-22 – 2018-01-27 (×6): 100 mg via ORAL
  Filled 2018-01-21 (×8): qty 1

## 2018-01-21 MED ORDER — LOPERAMIDE HCL 2 MG PO CAPS: 2.0000 mg | ORAL_CAPSULE | ORAL | Status: AC | PRN

## 2018-01-21 MED ORDER — CLONIDINE HCL 0.1 MG PO TABS
0.1000 mg | ORAL_TABLET | Freq: Three times a day (TID) | ORAL | Status: DC
  Administered 2018-01-22 – 2018-01-27 (×22): 0.1 mg via ORAL
  Filled 2018-01-21 (×30): qty 1

## 2018-01-21 MED ORDER — METHADONE HCL 10 MG/ML PO CONC: 170.0000 mg | Freq: Every day | ORAL | Status: DC

## 2018-01-21 MED ORDER — ACETAMINOPHEN 325 MG PO TABS: 650.0000 mg | ORAL_TABLET | Freq: Four times a day (QID) | ORAL | Status: DC | PRN

## 2018-01-21 MED ORDER — LORAZEPAM 1 MG PO TABS
1.0000 mg | ORAL_TABLET | Freq: Four times a day (QID) | ORAL | Status: DC
  Administered 2018-01-22 (×2): 1 mg via ORAL
  Filled 2018-01-21 (×2): qty 1

## 2018-01-21 MED ORDER — LORAZEPAM 1 MG PO TABS: 1.0000 mg | ORAL_TABLET | Freq: Three times a day (TID) | ORAL | Status: DC

## 2018-01-21 MED ORDER — ADULT MULTIVITAMIN W/MINERALS CH
1.0000 | ORAL_TABLET | Freq: Every day | ORAL | Status: DC
  Administered 2018-01-22 – 2018-01-27 (×6): 1 via ORAL
  Filled 2018-01-21 (×8): qty 1

## 2018-01-21 MED ORDER — ETODOLAC 400 MG PO TABS
400.0000 mg | ORAL_TABLET | Freq: Two times a day (BID) | ORAL | Status: DC
  Administered 2018-01-22: 400 mg via ORAL
  Filled 2018-01-21 (×3): qty 1

## 2018-01-21 MED ORDER — VITAMIN B-1 100 MG PO TABS
100.0000 mg | ORAL_TABLET | Freq: Every day | ORAL | Status: DC
  Administered 2018-01-21: 100 mg via ORAL
  Filled 2018-01-21: qty 1

## 2018-01-21 MED ORDER — TRAZODONE HCL 100 MG PO TABS
100.0000 mg | ORAL_TABLET | Freq: Every day | ORAL | Status: DC
  Administered 2018-01-22 – 2018-01-26 (×6): 100 mg via ORAL
  Filled 2018-01-21 (×9): qty 1

## 2018-01-21 MED ORDER — ARIPIPRAZOLE 5 MG PO TABS
5.0000 mg | ORAL_TABLET | Freq: Every day | ORAL | Status: DC
  Administered 2018-01-22: 5 mg via ORAL
  Filled 2018-01-21 (×2): qty 1
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
## 2018-01-21 NOTE — ED Provider Notes (Signed)
[facility] EMERGENCY DEPARTMENT Provider Note   CSN: [id number] Arrival date & time: 01/21/18  1001   History   Chief Complaint Chief Complaint  Patient presents with  . Withdrawal  . Suicidal    HPI Carla Park is a 37 y.o. female.  The history is provided by the patient.  Mental Health Problem  Presenting symptoms: depression and suicidal thoughts   Presenting symptoms: no hallucinations   Degree of incapacity (severity):  Severe Onset quality:  Gradual Duration:  2 days Timing:  Constant Progression:  Worsening Chronicity:  Recurrent Context: alcohol use and stressful life event (separating from husband)   Treatment compliance:  Some of the time Time since last psychoactive medication taken:  1 day Relieved by:  Nothing Worsened by:  Alcohol (drinks alcohol daily, last drink >24 hours ago) Associated symptoms: no abdominal pain and no chest pain   Risk factors: hx of mental illness and hx of suicide attempts     Past Medical History:  Diagnosis Date  . Arthritis   . Chronic pain   . Depression   . ETOH abuse   . Fibromyalgia   . Hep C w/ coma, chronic (HCC)   . Hepatitis C   . Opiate abuse, continuous (HCC)    on Methadone currently  . Suicide attempt (HCC)    cut wrist  . Vision abnormalities     Patient Active Problem List   Diagnosis Date Noted  . MDD (major depressive disorder) 09/12/2017  . Cervical radiculopathy at C6 05/19/2017  . Hepatitis C infection 05/19/2017  . Multiple joint pain 02/06/2017  . 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 Memory loss 02/06/2017  . Family history of MS (multiple sclerosis) 02/06/2017  . Alcohol use disorder, moderate, dependence (HCC) 01/28/2017  . MDD (major depressive disorder), recurrent severe, without psychosis (HCC) 01/14/2017  . 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 Alcohol withdrawal (HCC) 05/29/2015  . Chronic pain   . Fibromyalgia   . Hep C  w/ coma, chronic (HCC)   . Migraines 11/18/2012  . RA (rheumatoid arthritis) (HCC) 11/18/2012    Past Surgical History:  Procedure Laterality Date  . ABDOMINAL HYSTERECTOMY    . APPENDECTOMY    . CESAREAN SECTION    . CHOLECYSTECTOMY    . KNEE SURGERY       OB History   None      Home Medications    Prior to Admission medications   Medication Sig Start Date End Date Taking? Authorizing Provider  ARIPiprazole (ABILIFY) 5 MG tablet Take 1 tablet (5 mg total) by mouth daily. For mood control 09/16/17  Yes Nwoko, Nicole Kindred I, NP  cloNIDine (CATAPRES) 0.1 MG tablet Take 1 tablet (0.1 mg total) by mouth 4 (four) times daily - after meals and at bedtime. Anxiety & high blood pressure 09/15/17  Yes Nwoko, Agnes I, NP  DULoxetine (CYMBALTA) 20 MG capsule Take 1 capsule (20 mg total) by mouth daily. For depression 09/16/17  Yes Nwoko, Nicole Kindred I, NP  etodolac (LODINE) 400 MG tablet Take 400 mg by mouth 2 (two) times daily. 10/19/17  Yes [provider]  gabapentin (NEURONTIN) 300 MG capsule Take 2 capsules (600 mg total) by mouth 3 (three) times daily. For agitation 09/16/17  Yes Nwoko, Agnes I, NP  HUMIRA PEN 40 MG/0.4ML PNKT Inject 0.4 mLs into the muscle every 14 (fourteen) days. 01/07/18  Yes [provider]  hydrOXYzine (ATARAX/VISTARIL) 50 MG tablet Take 1  tablet (50 mg) by mouth every 6 hours as needed: For anxiety 09/15/17  Yes Armandina Stammer I, NP  levothyroxine (SYNTHROID, LEVOTHROID) 25 MCG tablet Take 1 tablet (25 mcg total) by mouth daily before breakfast. For low thyroid function 09/16/17  Yes Nwoko, Agnes I, NP  methadone (DOLOPHINE) 10 MG/ML solution Take 16 mLs (160 mg total) by mouth daily. For opioid addiction Patient taking differently: Take 170 mg by mouth daily. For opioid addiction 09/15/17  Yes Armandina Stammer I, NP  traZODone (DESYREL) 100 MG tablet Take 1 tablet (100 mg total) by mouth at bedtime. For sleep 09/15/17  Yes Armandina Stammer I, NP  Multiple Vitamin (MULTIVITAMIN  WITH MINERALS) TABS tablet Take 1 tablet by mouth 3 (three) times daily. For low Vitamin Patient not taking: Reported on 01/21/2018 09/15/17   Armandina Stammer I, NP  nicotine (NICODERM CQ - DOSED IN MG/24 HOURS) 21 mg/24hr patch Place 1 patch (21 mg total) onto the skin daily. For smoking cessation Patient not taking: Reported on 01/21/2018 09/16/17   Sanjuana Kava, NP    Family History Family History  Problem Relation Age of Onset  . Multiple sclerosis Mother   . Hypertension Father     Social History Social History   Tobacco Use  . Smoking status: Current Every Day Smoker    Packs/day: 1.00    Types: Cigarettes  . Smokeless tobacco: Never Used  Substance Use Topics  . Alcohol use: Yes    Comment: 2-500 ml boxes daily at least  . Drug use: No     Allergies   Erythromycin; Lamictal [lamotrigine]; and Sumatriptan   Review of Systems Review of Systems  Constitutional: Negative for chills and fever.  HENT: Negative for ear pain and sore throat.   Eyes: Negative for pain and visual disturbance.  Respiratory: Negative for cough and shortness of breath.   Cardiovascular: Negative for chest pain and palpitations.  Gastrointestinal: Positive for nausea. Negative for abdominal pain and vomiting.  Genitourinary: Negative for dysuria and hematuria.  Musculoskeletal: Negative for arthralgias and back pain.  Skin: Negative for color change and rash.  Neurological: Positive for tremors. Negative for seizures and syncope.  Psychiatric/Behavioral: Positive for dysphoric mood and suicidal ideas. Negative for hallucinations.  All other systems reviewed and are negative.    Physical Exam Updated Vital Signs BP (!) 142/93   Pulse 82   Temp 99.2 F (37.3 C) (Oral)   Resp 16   Ht 5\' 3"  (1.6 m)   Wt 63.5 kg (140 lb)   SpO2 97%   BMI 24.80 kg/m   Physical Exam  Constitutional: She is oriented to person, place, and time. She appears well-developed and well-nourished. No distress.    HENT:  Head: Normocephalic and atraumatic.  Mouth/Throat: Oropharynx is clear and moist.  Eyes: Conjunctivae and EOM are normal.  Neck: Normal range of motion. Neck supple.  Cardiovascular: Normal rate, regular rhythm and intact distal pulses. Exam reveals no friction rub.  No murmur heard. Pulmonary/Chest: Effort normal and breath sounds normal. No stridor. No respiratory distress. She has no wheezes.  Abdominal: Soft. She exhibits no distension. There is no tenderness. There is no guarding.  Musculoskeletal: She exhibits no edema.  Neurological: She is alert and oriented to person, place, and time. She displays tremor (mild). No cranial nerve deficit.  Skin: Skin is warm and dry.  Healed scar to anterior left wrist  Psychiatric: She exhibits a depressed mood. She expresses suicidal ideation. She expresses suicidal plans.  Nursing note and vitals reviewed.    ED Treatments / Results  Labs (all labs ordered are listed, but only abnormal results are displayed) Labs Reviewed  COMPREHENSIVE METABOLIC PANEL - Abnormal; Notable for the following components:      Result Value   Chloride 94 (*)    AST 126 (*)    ALT 131 (*)    Alkaline Phosphatase 243 (*)    All other components within normal limits  ACETAMINOPHEN LEVEL - Abnormal; Notable for the following components:   Acetaminophen (Tylenol), Serum <10 (*)    All other components within normal limits  CBC - Abnormal; Notable for the following components:   RBC 5.26 (*)    Hemoglobin 16.0 (*)    HCT 48.1 (*)    Platelets 146 (*)    All other components within normal limits  ETHANOL  SALICYLATE LEVEL  RAPID URINE DRUG SCREEN, HOSP PERFORMED  I-STAT BETA HCG BLOOD, ED ([facility], [facility], [facility] ONLY)    EKG None  Radiology No results found.  Procedures Procedures (including critical care time)  Medications Ordered in ED Medications  LORazepam (ATIVAN) injection 0-4 mg ( Intravenous See Alternative 01/21/18 1802)    Or  LORazepam  (ATIVAN) tablet 0-4 mg (1 mg Oral Given 01/21/18 1802)  LORazepam (ATIVAN) injection 0-4 mg (has no administration in time range)    Or  LORazepam (ATIVAN) tablet 0-4 mg (has no administration in time range)  thiamine (VITAMIN B-1) tablet 100 mg (100 mg Oral Given 01/21/18 1802)    Or  thiamine (B-1) injection 100 mg ( Intravenous See Alternative 01/21/18 1802)     Initial Impression / Assessment and Plan / ED Course  I have reviewed the triage vital signs and the nursing notes.  Pertinent labs & imaging results that were available during my care of the patient were reviewed by me and considered in my medical decision making (see chart for details).     Diane Hanel is a 37 y.o. female with PMHx of depression, EtOH abuse who p/w SI x 2 days, plans to kill herself by slitting her wrists. Similar suicide attempt ~1 year ago. Reviewed and confirmed nursing documentation for past medical history, family history, social history. VS afebrile, wnl. Exam remarkable for mild tremors, otherwise wnl. No h/o withdrawal seizures, no concern for DTs at this time.   PO ativan and thiamin given per CIWA protocol. On reexam, pt resting comfortably. Beta hcg neg. UDS neg. CMP with elevated transaminitis, stable. CBC unremarkable. APAP, salicylate neg. EtOH level neg. Medically clear.  Consulted psychiatry, who evaluated patient and will admit.    Old records reviewed. Labs reviewed by me and used in the medical decision making. Admitted to psychiatry.   Final Clinical Impressions(s) / ED Diagnoses   Final diagnoses:  Suicidal ideations    ED Discharge Orders    None       Diannia Ruder, MD 01/21/18 4782    Gwyneth Sprout, MD 01/23/18 2255

## 2018-01-21 NOTE — ED Notes (Signed)
Pt to go to [facility], room 406, after 2230 tonight. Nira Conn, NP accepting, Dr. Jola Babinski attending. Report at [phone]. [facility] to transport.

## 2018-01-21 NOTE — ED Notes (Signed)
Pt has been wanded and changed into maroon scrubs. Pt's significant other will take belongings home

## 2018-01-21 NOTE — Progress Notes (Signed)
Pt accepted to [facility], room #406. Nira Conn, NP is the accepting provider. Dr. Jola Babinski is the attending provider.   Call report to [phone]. [email or web address] notified.  Pt is voluntary and can be transported by [facility].   Pt is scheduled to arrive at [facility] at 10:30pm.   Wells Guiles, LCSW, LCAS Disposition CSW [facility] [phone]

## 2018-01-21 NOTE — ED Notes (Signed)
[facility] called. Awaiting medical clearance and pt will be able to come over to [facility] for a bed. MD in to see pt.

## 2018-01-21 NOTE — ED Notes (Signed)
[facility] here to transport pt to [facility]. Pt ambulatory. Stable. No complaints.

## 2018-01-21 NOTE — ED Triage Notes (Signed)
Pt states she is here for alcohol withdrawal. Pt's last drink was yesterday morning. Pt drinks two bottles of mouth wash. Pt feeling shaking all over, abd cramping, states last night she was hearing voices. Pt also feeling SI. Hx of previous SI attempts. No plan at this time.

## 2018-01-22 DIAGNOSIS — F332 Major depressive disorder, recurrent severe without psychotic features: Principal | ICD-10-CM

## 2018-01-22 DIAGNOSIS — F1120 Opioid dependence, uncomplicated: Secondary | ICD-10-CM

## 2018-01-22 DIAGNOSIS — G4700 Insomnia, unspecified: Secondary | ICD-10-CM

## 2018-01-22 DIAGNOSIS — F10239 Alcohol dependence with withdrawal, unspecified: Secondary | ICD-10-CM

## 2018-01-22 DIAGNOSIS — F17210 Nicotine dependence, cigarettes, uncomplicated: Secondary | ICD-10-CM

## 2018-01-22 DIAGNOSIS — E039 Hypothyroidism, unspecified: Secondary | ICD-10-CM

## 2018-01-22 DIAGNOSIS — Z818 Family history of other mental and behavioral disorders: Secondary | ICD-10-CM

## 2018-01-22 DIAGNOSIS — F419 Anxiety disorder, unspecified: Secondary | ICD-10-CM

## 2018-01-22 DIAGNOSIS — F1020 Alcohol dependence, uncomplicated: Secondary | ICD-10-CM

## 2018-01-22 DIAGNOSIS — Z811 Family history of alcohol abuse and dependence: Secondary | ICD-10-CM

## 2018-01-22 DIAGNOSIS — R45851 Suicidal ideations: Secondary | ICD-10-CM

## 2018-01-22 LAB — LIPID PANEL
Cholesterol: 222 mg/dL — ABNORMAL HIGH (ref 0–200)
HDL: 119 mg/dL (ref >40)
LDL Cholesterol: 88 mg/dL (ref 0–99)
Total CHOL/HDL Ratio: 1.9 RATIO
Triglycerides: 77 mg/dL (ref <150)
VLDL: 15 mg/dL (ref 0–40)

## 2018-01-22 LAB — HEMOGLOBIN A1C
Hgb A1c MFr Bld: 4.6 % — ABNORMAL LOW (ref 4.8–5.6)
Mean Plasma Glucose: 85.32 mg/dL

## 2018-01-22 LAB — TSH: TSH: 6.755 u[IU]/mL — ABNORMAL HIGH (ref 0.350–4.500)

## 2018-01-22 MED ORDER — NICOTINE 21 MG/24HR TD PT24
21.0000 mg | MEDICATED_PATCH | Freq: Every day | TRANSDERMAL | Status: DC
  Administered 2018-01-22 – 2018-01-27 (×6): 21 mg via TRANSDERMAL
  Filled 2018-01-22 (×7): qty 1

## 2018-01-22 MED ORDER — CHLORDIAZEPOXIDE HCL 25 MG PO CAPS
25.0000 mg | ORAL_CAPSULE | Freq: Four times a day (QID) | ORAL | Status: DC | PRN
  Administered 2018-01-22 – 2018-01-26 (×7): 25 mg via ORAL
  Filled 2018-01-22 (×7): qty 1

## 2018-01-22 MED ORDER — ARIPIPRAZOLE 10 MG PO TABS
10.0000 mg | ORAL_TABLET | Freq: Every day | ORAL | Status: DC
  Administered 2018-01-23 – 2018-01-27 (×5): 10 mg via ORAL
  Filled 2018-01-22 (×8): qty 1

## 2018-01-22 MED ORDER — METHADONE HCL 10 MG PO TABS
170.0000 mg | ORAL_TABLET | Freq: Every day | ORAL | Status: DC
  Administered 2018-01-22 – 2018-01-27 (×6): 170 mg via ORAL
  Filled 2018-01-22 (×6): qty 17

## 2018-01-22 MED ORDER — ETODOLAC 200 MG PO CAPS
400.0000 mg | ORAL_CAPSULE | Freq: Two times a day (BID) | ORAL | Status: DC
  Administered 2018-01-22 – 2018-01-27 (×10): 400 mg via ORAL
  Filled 2018-01-22 (×14): qty 2

## 2018-01-22 MED ORDER — NICOTINE POLACRILEX 2 MG MT GUM: 2.0000 mg | CHEWING_GUM | OROMUCOSAL | Status: DC | PRN

## 2018-01-22 NOTE — Tx Team (Signed)
Interdisciplinary Treatment and Diagnostic Plan Update  01/22/2018 Time of Session: [place] MRN: [id number]  Principal Diagnosis: MDD, recurrent, severe  Secondary Diagnoses: Active Problems:   Severe recurrent major depression without psychotic features (HCC)   Current Medications:  Current Facility-Administered Medications  Medication Dose Route Frequency Provider Last Rate Last Dose  . acetaminophen (TYLENOL) tablet 650 mg  650 mg Oral Q6H PRN Lindon Romp A, NP      . alum & mag hydroxide-simeth (MAALOX/MYLANTA) 200-200-20 MG/5ML suspension 30 mL  30 mL Oral Q4H PRN Lindon Romp A, NP      . ARIPiprazole (ABILIFY) tablet 5 mg  5 mg Oral Daily Lindon Romp A, NP   5 mg at 01/22/18 0807  . cloNIDine (CATAPRES) tablet 0.1 mg  0.1 mg Oral TID PC & HS Lindon Romp A, NP   0.1 mg at 01/22/18 0807  . DULoxetine (CYMBALTA) DR capsule 20 mg  20 mg Oral Daily Lindon Romp A, NP   20 mg at 01/22/18 0813  . etodolac (LODINE) tablet 400 mg  400 mg Oral BID Lindon Romp A, NP   400 mg at 01/22/18 0813  . gabapentin (NEURONTIN) capsule 600 mg  600 mg Oral TID Lindon Romp A, NP   600 mg at 01/22/18 0808  . hydrOXYzine (ATARAX/VISTARIL) tablet 50 mg  50 mg Oral Q6H PRN Lindon Romp A, NP      . levothyroxine (SYNTHROID, LEVOTHROID) tablet 25 mcg  25 mcg Oral QAC breakfast Lindon Romp A, NP   25 mcg at 01/22/18 0631  . loperamide (IMODIUM) capsule 2-4 mg  2-4 mg Oral PRN Lindon Romp A, NP      . LORazepam (ATIVAN) tablet 1 mg  1 mg Oral Q6H PRN Lindon Romp A, NP      . LORazepam (ATIVAN) tablet 1 mg  1 mg Oral QID Lindon Romp A, NP   1 mg at 01/22/18 1287   Followed by  . [START ON 01/23/2018] LORazepam (ATIVAN) tablet 1 mg  1 mg Oral TID Rozetta Nunnery, NP       Followed by  . [START ON 01/24/2018] LORazepam (ATIVAN) tablet 1 mg  1 mg Oral BID Rozetta Nunnery, NP       Followed by  . [START ON 01/25/2018] LORazepam (ATIVAN) tablet 1 mg  1 mg Oral Daily Lindon Romp A, NP      .  magnesium hydroxide (MILK OF MAGNESIA) suspension 30 mL  30 mL Oral Daily PRN Lindon Romp A, NP      . methadone (DOLOPHINE) tablet 170 mg  170 mg Oral Daily Sharma Covert, MD      . multivitamin with minerals tablet 1 tablet  1 tablet Oral Daily Lindon Romp A, NP   1 tablet at 01/22/18 0807  . nicotine polacrilex (NICORETTE) gum 2 mg  2 mg Oral PRN Sharma Covert, MD      . ondansetron (ZOFRAN-ODT) disintegrating tablet 4 mg  4 mg Oral Q6H PRN Lindon Romp A, NP      . thiamine (VITAMIN B-1) tablet 100 mg  100 mg Oral Daily Lindon Romp A, NP   100 mg at 01/22/18 0807  . traZODone (DESYREL) tablet 100 mg  100 mg Oral QHS Lindon Romp A, NP   100 mg at 01/22/18 0033   PTA Medications: Medications Prior to Admission  Medication Sig Dispense Refill Last Dose  . ARIPiprazole (ABILIFY) 5 MG tablet Take 1 tablet (5 mg total) by  mouth daily. For mood control 30 tablet 0 01/20/2018 at Unknown time  . cloNIDine (CATAPRES) 0.1 MG tablet Take 1 tablet (0.1 mg total) by mouth 4 (four) times daily - after meals and at bedtime. Anxiety & high blood pressure 120 tablet 0 01/21/2018 at Unknown time  . DULoxetine (CYMBALTA) 20 MG capsule Take 1 capsule (20 mg total) by mouth daily. For depression 30 capsule 0 01/20/2018 at Unknown time  . etodolac (LODINE) 400 MG tablet Take 400 mg by mouth 2 (two) times daily.  2 01/20/2018 at Unknown time  . gabapentin (NEURONTIN) 300 MG capsule Take 2 capsules (600 mg total) by mouth 3 (three) times daily. For agitation 180 capsule 0 01/21/2018 at Unknown time  . HUMIRA PEN 40 MG/0.4ML PNKT Inject 0.4 mLs into the muscle every 14 (fourteen) days.   01/20/2018 at Unknown time  . hydrOXYzine (ATARAX/VISTARIL) 50 MG tablet Take 1 tablet (50 mg) by mouth every 6 hours as needed: For anxiety 120 tablet 0 prn at unk  . levothyroxine (SYNTHROID, LEVOTHROID) 25 MCG tablet Take 1 tablet (25 mcg total) by mouth daily before breakfast. For low thyroid function 30 tablet 0 01/21/2018 at 0700   . methadone (DOLOPHINE) 10 MG/ML solution Take 16 mLs (160 mg total) by mouth daily. For opioid addiction (Patient taking differently: Take 170 mg by mouth daily. For opioid addiction)  0 01/21/2018 at 0800  . Multiple Vitamin (MULTIVITAMIN WITH MINERALS) TABS tablet Take 1 tablet by mouth 3 (three) times daily. For low Vitamin (Patient not taking: Reported on 01/21/2018) 60 tablet 0 Not Taking at Unknown time  . nicotine (NICODERM CQ - DOSED IN MG/24 HOURS) 21 mg/24hr patch Place 1 patch (21 mg total) onto the skin daily. For smoking cessation (Patient not taking: Reported on 01/21/2018) 28 patch 0 Not Taking at Unknown time  . traZODone (DESYREL) 100 MG tablet Take 1 tablet (100 mg total) by mouth at bedtime. For sleep 30 tablet 0 01/20/2018 at Unknown time    Patient Stressors: Financial difficulties Marital or family conflict Substance abuse  Patient Strengths: Capable of independent living Curator fund of knowledge Motivation for treatment/growth Work skills  Treatment Modalities: Medication Management, Group therapy, Case management,  1 to 1 session with clinician, Psychoeducation, Recreational therapy.   Physician Treatment Plan for Primary Diagnosis: MDD, recurrent, severe  Medication Management: Evaluate patient's response, side effects, and tolerance of medication regimen.  Therapeutic Interventions: 1 to 1 sessions, Unit Group sessions and Medication administration.  Evaluation of Outcomes: Not Met  Physician Treatment Plan for Secondary Diagnosis: Active Problems:   Severe recurrent major depression without psychotic features ([place])  Medication Management: Evaluate patient's response, side effects, and tolerance of medication regimen.  Therapeutic Interventions: 1 to 1 sessions, Unit Group sessions and Medication administration.  Evaluation of Outcomes: Not Met   RN Treatment Plan for Primary Diagnosis: MDD, recurrent, severe Long Term Goal(s):  Knowledge of disease and therapeutic regimen to maintain health will improve  Short Term Goals: Ability to remain free from injury will improve, Ability to disclose and discuss suicidal ideas and Ability to identify and develop effective coping behaviors will improve  Medication Management: RN will administer medications as ordered by provider, will assess and evaluate patient's response and provide education to patient for prescribed medication. RN will report any adverse and/or side effects to prescribing provider.  Therapeutic Interventions: 1 on 1 counseling sessions, Psychoeducation, Medication administration, Evaluate responses to treatment, Monitor vital signs and CBGs as ordered, Perform/monitor  CIWA, COWS, AIMS and Fall Risk screenings as ordered, Perform wound care treatments as ordered.  Evaluation of Outcomes: Not Met   LCSW Treatment Plan for Primary Diagnosis: MDD, recurrent, severe Long Term Goal(s): Safe transition to appropriate next level of care at discharge, Engage patient in therapeutic group addressing interpersonal concerns.  Short Term Goals: Engage patient in aftercare planning with referrals and resources, Facilitate patient progression through stages of change regarding substance use diagnoses and concerns and Identify triggers associated with mental health/substance abuse issues  Therapeutic Interventions: Assess for all discharge needs, 1 to 1 time with Social worker, Explore available resources and support systems, Assess for adequacy in community support network, Educate family and significant other(s) on suicide prevention, Complete Psychosocial Assessment, Interpersonal group therapy.  Evaluation of Outcomes: Not Met   Progress in Treatment: Attending groups: No. New to unit. Continuing to assess.  Participating in groups: No. Taking medication as prescribed: Yes. Toleration medication: Yes. Family/Significant other contact made: No, will contact:  family  member if pt consents to collateral contact.  Patient understands diagnosis: Yes. Discussing patient identified problems/goals with staff: Yes. Medical problems stabilized or resolved: Yes. Denies suicidal/homicidal ideation: Yes. Issues/concerns per patient self-inventory: No. Other: n/a   New problem(s) identified: No, Describe:  n/a  New Short Term/Long Term Goal(s): detox, medication management for mood stabilization; elimination of SI thoughts; development of comprehensive mental wellness/sobriety plan.   Patient Goals:  "To get sober and into a recovery program. I need medication help with my anxiety and depression."   Discharge Plan or Barriers: CSW assessing for appropriate referrals. [facility] pamphlet, Mobile Crisis information, and AA/NA information provided to patient for additional community support and resources.   Reason for Continuation of Hospitalization: Anxiety Depression Medication stabilization Suicidal ideation Withdrawal symptoms  Estimated Length of Stay: Monday, 01/25/18  Attendees: Patient: Carla Park  01/22/2018 9:18 AM  Physician: Dr. Nancy Fetter MD; Dr. Mallie Darting MD 01/22/2018 9:18 AM  Nursing: Estill Bamberg RN; Danika RN 01/22/2018 9:18 AM  RN Care Manager: Rhunette Croft 01/22/2018 9:18 AM  Social Worker: Janice Norrie LCSW 01/22/2018 9:18 AM  Recreational Therapist: x 01/22/2018 9:18 AM  Other: Lindell Spar NP 01/22/2018 9:18 AM  Other:  01/22/2018 9:18 AM  Other: 01/22/2018 9:18 AM    Scribe for Treatment Team: Avelina Laine, LCSW 01/22/2018 9:18 AM

## 2018-01-22 NOTE — Progress Notes (Signed)
D: Patient alert and oriented. Affect/mood: Depressed, anxious. Patient endorses passive SI, though maintains that she can remain safe on the unit. Denies pain. Patient has been tearful during interactions on the unit today, sharing that she feels depressed and as though she has let people in her life down. Support and encouragement given during this time. Patient appeared tremulous earlier this afternoon, at which time patient received librium.  A: Scheduled medications administered to patient per MD order. Support and encouragement provided. Routine safety checks conducted every 15 minutes. Patient informed to notify staff with problems or concerns. Encouraged to notify staff if feelings of harm toward self or others arise. Patient agrees.  R: No adverse drug reactions noted. Patient contracts for safety at this time. Patient compliant with medications and treatment plan. Patient receptive, calm, and cooperative. Patient interacts well with others on the unit. Patient remains safe at this time. Will continue to monitor.

## 2018-01-22 NOTE — Tx Team (Signed)
Initial Treatment Plan 01/22/2018 2:38 AM Carla Park IBB:[id number]    PATIENT STRESSORS: Financial difficulties Marital or family conflict Substance abuse   PATIENT STRENGTHS: Capable of independent living Wellsite geologist fund of knowledge Motivation for treatment/growth Work skills   PATIENT IDENTIFIED PROBLEMS: Depression  Suicidal ideation  Substance abuse  "Get depression and anxiety back under control"  "Not just get sober, get into recovery"             DISCHARGE CRITERIA:  Improved stabilization in mood, thinking, and/or behavior Verbal commitment to aftercare and medication compliance Withdrawal symptoms are absent or subacute and managed without 24-hour nursing intervention  PRELIMINARY DISCHARGE PLAN: Outpatient therapy Medication management  PATIENT/FAMILY INVOLVEMENT: This treatment plan has been presented to and reviewed with the patient, [name].  The patient and family have been given the opportunity to ask questions and make suggestions.  Levin Bacon, RN 01/22/2018, 2:38 AM

## 2018-01-22 NOTE — BHH Suicide Risk Assessment (Signed)
[facility] Admission Suicide Risk Assessment   Nursing information obtained from:  Patient Demographic factors:  Caucasian Current Mental Status:  Suicidal ideation indicated by patient Loss Factors:  Loss of significant relationship Historical Factors:  Prior suicide attempts, Family history of mental illness or substance abuse Risk Reduction Factors:  Employed  Total Time spent with patient: 1 hour Principal Problem: MDD (major depressive disorder), recurrent severe, without psychosis (HCC) Diagnosis:   Patient Active Problem List   Diagnosis Date Noted  . Alcohol use disorder, severe, dependence (HCC) [F10.20] 01/22/2018  . MDD (major depressive disorder) [F32.9] 09/12/2017  . Cervical radiculopathy at C6 [M54.12] 05/19/2017  . Hepatitis C infection [B19.20] 05/19/2017  . Multiple joint pain [M25.50] 02/06/2017  . 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 Memory loss [R41.3] 02/06/2017  . Family history of MS (multiple sclerosis) [Z82.0] 02/06/2017  . Alcohol use disorder, moderate, dependence (HCC) [F10.20] 01/28/2017  . MDD (major depressive disorder), recurrent severe, without psychosis (HCC) [F33.2] 01/14/2017  . 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 Alcohol withdrawal (HCC) [F10.239] 05/29/2015  . Chronic pain [G89.29]   . Fibromyalgia [M79.7]   . 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 Migraines [G43.909] 11/18/2012  . RA (rheumatoid arthritis) (HCC) [M06.9] 11/18/2012   Subjective Data: Carla Park is a 37 y/o F with history of MDD and alcohol use disorder who was admitted voluntarily from [facility] where he was brought in by her husband with worsening depression, SI with plan to cut her wrists, anxiety, and relapse of alcohol use. Pt shares recent stressor of homelessness after being asked to leave her halfway house due to alcohol use. Pt was medically cleared and then transferred to [facility] for additional treatment and evaluation.    Upon initial evaluation, pt shares, "My husband and I separated back in February. He's getting his financial situation stable, and I was living in sober living. I slacked off on my meds for two weeks, and then I got more depressed. Things went downhill. It's been rough. I was drinking and I got tested, and I had to leave the house." Pt reports that when faced with stressor of homelessness she had worsening of her mood symptoms and was contemplating suicide via cutting her wrists. Pt shares that her husband picked her up and brought her to the hospital. She endorses depressive symptoms of depressed mood, low energy, poor concentration, anhedonia, guilty feelings, and decreased appetite. She denies HI/AH/VH. She denies symptoms of mania, OCD, and PTSD. She reports that she has been drinking for about 2-3 weeks, and she has been consuming about a 20 ounce bottle of mouthwash daily, explaining that she felt it would be harder for the staff at sober living to notice. She denies other illicit substance use.  Discussed with patient about treatment options. She reports that her medications had been helpful prior to stopping them, and she would like to get back on them. Pt agrees to be resumed on gabapentin, abilify, trazodone, cymbalta, and methadone. Discussed with patient about possible transition to long-acting injectable form of abilify, and she would be open to that option. She is interested in speaking with SW team about referral to substance use treatment options. Pt was in agreement with the above plan, and she had no further questions, comments, or concerns.    Continued Clinical Symptoms:  Alcohol Use Disorder Identification Test Final Score (AUDIT): 33 The "Alcohol Use Disorders Identification Test", Guidelines for Use in Primary Care,  Second Edition.  World Science writer [place]). Score between 0-7:  no or low risk or alcohol related problems. Score between 8-15:  moderate risk of alcohol related  problems. Score between 16-19:  high risk of alcohol related problems. Score 20 or above:  warrants further diagnostic evaluation for alcohol dependence and treatment.   CLINICAL FACTORS:   Severe Anxiety and/or Agitation Depression:   Severe Alcohol/Substance Abuse/Dependencies More than one psychiatric diagnosis Unstable or Poor Therapeutic Relationship Previous Psychiatric Diagnoses and Treatments   Musculoskeletal: Strength & Muscle Tone: within normal limits Gait & Station: normal Patient leans: N/A  Psychiatric Specialty Exam: Physical Exam  Nursing note and vitals reviewed.   Review of Systems  Constitutional: Negative for chills and fever.  Respiratory: Negative for cough and shortness of breath.   Cardiovascular: Negative for chest pain.  Gastrointestinal: Negative for abdominal pain, heartburn, nausea and vomiting.  Psychiatric/Behavioral: Positive for depression, substance abuse and suicidal ideas. Negative for hallucinations. The patient is nervous/anxious. The patient does not have insomnia.     Blood pressure (!) 151/110, pulse 99, temperature 99.2 F (37.3 C), temperature source Oral, resp. rate 18, height 5\' 2"  (1.575 m), weight 64.9 kg (143 lb).Body mass index is 26.16 kg/m.  General Appearance: Casual and Fairly Groomed  Eye Contact:  Good  Speech:  Clear and Coherent and Normal Rate  Volume:  Normal  Mood:  Anxious and Depressed  Affect:  Congruent, Depressed and Tearful  Thought Process:  Coherent and Goal Directed  Orientation:  Full (Time, Place, and Person)  Thought Content:  Logical  Suicidal Thoughts:  Yes.  with intent/plan  Homicidal Thoughts:  No  Memory:  Immediate;   Fair Recent;   Fair Remote;   Fair  Judgement:  Poor  Insight:  Lacking  Psychomotor Activity:  Normal  Concentration:  Concentration: Fair  Recall:  of Knowledge:  Fair  Language:  Fair  Akathisia:  No  Handed:    AIMS (if indicated):     Assets:   Resilience Social Support  ADL's:  Intact  Cognition:  WNL  Sleep:  Number of Hours: 4.75   COGNITIVE FEATURES THAT CONTRIBUTE TO RISK:  None    SUICIDE RISK:   Moderate:  Frequent suicidal ideation with limited intensity, and duration, some specificity in terms of plans, no associated intent, good self-control, limited dysphoria/symptomatology, some risk factors present, and identifiable protective factors, including available and accessible social support.  PLAN OF CARE:   -Admit to inpatient level of care  -MDD, recurrent, severe, without psychosis   -Continue cymbalta DR 20mg  po qDay   -Change abilify 5mg  po qDay to abilify 10mg  po qDay  -Alcohol withdrawal   -Continue librium 25mg  po q6h prn withdrawal  -Anxiety   -Continue gabapentin 600mg  po TID   -Continue vistaril 50mg  po q6h prn anxiety   -Continue clonidine 0.1mg  TID with meals and at bedtime    -hypothyroidism   -Continue synthroid [employer] qDay  -opiate dependence   -Continue methadone 170mg  po qDay  -insomnia   -Continue trazodone 100mg  po qhs  -Encourage participation in groups and therapeutic milieu  -Disposition planning will be ongoing  I certify that inpatient services furnished can reasonably be expected to improve the patient's condition.   , MD 01/22/2018, 2:57 PM

## 2018-01-22 NOTE — Progress Notes (Signed)
Carla Park is a 37 year old female being admitted voluntarily to [facility] from [facility].  She came to the ED after relapsing this past month after being sober for 4 months, increasing depression and suicidal ideation.  During [facility] admission, she was pleasant and cooperative.  She continues to voice passive SI and will contract for safety on the unit.  She denies HI or A/V hallucinations.  She feels worthless and that she has let everyone down.  She continues to deal with her failing marriage and that continues to be a main stressor.  She also reports that she is currently homeless since she has been kicked out of her sober living house.  "I don't know what I am going to do."  Oriented her to the unit.  Admission paperwork completed and signed.  Belongings searched and secured in locker # 47, no contraband found.  Skin assessment completed and no skin issues noted.  Q 15 minute checks initiated for safety.  We will continue to monitor the progress towards her goals.
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
## 2018-01-22 NOTE — H&P (Addendum)
Psychiatric Admission Assessment Adult  Patient Identification: Baylea Bulson  MRN:  [id number]  Date of Evaluation:  01/22/2018  Chief Complaint: Relapse on alcohol, worsening depression triggering suicidal ideations.  Principal Diagnosis: Alcohol use disorder, moderated dependence.  Diagnosis:   Patient Active Problem List   Diagnosis Date Noted  . Alcohol use disorder, moderate, dependence (HCC) [F10.20] 01/28/2017    Priority: High  . Severe recurrent major depression without psychotic features (HCC) [F33.2] 01/21/2018  . MDD (major depressive disorder) [F32.9] 09/12/2017  . Cervical radiculopathy at C6 [M54.12] 05/19/2017  . Hepatitis C infection [B19.20] 05/19/2017  . Multiple joint pain [M25.50] 02/06/2017  . 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 Memory loss [R41.3] 02/06/2017  . Family history of MS (multiple sclerosis) [Z82.0] 02/06/2017  . MDD (major depressive disorder), recurrent severe, without psychosis (HCC) [F33.2] 01/14/2017  . 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 Alcohol withdrawal (HCC) [F10.239] 05/29/2015  . Chronic pain [G89.29]   . Fibromyalgia [M79.7]   . 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 Migraines [G43.909] 11/18/2012  . RA (rheumatoid arthritis) (HCC) [M06.9] 11/18/2012   History of Present Illness: This is one of several admission assessment in this [facility] hospital alone for this 37 year old Caucasian female. She is  known to our unit from prior admissions all related to substance use disorder. She presented to ED voluntarily yesterday with husband with complaints of worsening suicidal ideations of 2 days withy plans to cut her wrist after relapsing on alcohol by drinking mouth wash. She has been living in a sober living environment prior to this. Patient reports being on methadone maintenance at the [facility].   During this assessment, Jolayne reports, "My husband took me to the  [place] hospital ED yesterday. We got separated in January of this year, not by my choice. The separation has weighed a ton on me. It has been very difficult for me not to have my husband with me at all times. We still spend time together, but, he still tells me that he is not ready to be together with me again. I was feeling quite lonely, so, I reached out to someone I used to know for an ego boost, just someone to talk to. But, my husband found that out & got very upset with me. I have been living in a sober house for 3 months. I recently relapsed a couple of weeks ago on mouth wash. I got kicked out of the sober living Thursday night. My husband took me to the hospital. I feel like I'm putting a lot of pressure on my family. I feel they will be better off without me. I don't know what to do".  Associated Signs/Symptoms: Depression Symptoms:  depressed mood, insomnia, feelings of worthlessness/guilt, hopelessness,  (Hypo) Manic Symptoms: Impulsivity.  Anxiety Symptoms: Worsening anxiety, excessive worrying.  Psychotic Symptoms: Denies any hallucinations, delusions or paranoia.  PTSD Symptoms: Denies any PTSD symptoms or events.  Total Time spent with patient: 1 hour  Past Psychiatric History: Patient reports history of depression, prior suicide attempt. Denies hx of psychosis or mania . She had been admitted to our unit multiple times already, for similar presentation (worsening depression, suicidal ideations, alcohol/other substance abuse). Was diagnosed with MDD without psychotic features. Currently on the Methadone maintenance treatment. See a psychiatrist/therapist at the [facility].    Is the patient at risk to self? Yes.   (Passive thoughts, no plans).    Has the patient been a risk to self in the past 6 months? Yes.    Has the patient been a risk to self within the distant past? Yes.    Is the patient a risk to others? No.  Has the patient been a risk to others in the past  6 months? No.  Has the patient been a risk to others within the distant past? No.   Prior Inpatient Therapy: Yes, [facility] x multiple times. Prior Outpatient Therapy: Yes, receives methadone maintenance at [facility]   Alcohol Screening: 1. How often do you have a drink containing alcohol?: 4 or more times a week 2. How many drinks containing alcohol do you have on a typical day when you are drinking?: 10 or more 3. How often do you have six or more drinks on one occasion?: Daily or almost daily AUDIT-C Score: 12 4. How often during the last year have you found that you were not able to stop drinking once you had started?: Daily or almost daily 5. How often during the last year have you failed to do what was normally expected from you becasue of drinking?: Weekly 6. How often during the last year have you needed a first drink in the morning to get yourself going after a heavy drinking session?: Weekly 7. How often during the last year have you had a feeling of guilt of remorse after drinking?: Daily or almost daily 8. How often during the last year have you been unable to remember what happened the night before because you had been drinking?: Weekly 9. Have you or someone else been injured as a result of your drinking?: No 10. Has a relative or friend or a doctor or another health worker been concerned about your drinking or suggested you cut down?: Yes, during the last year Alcohol Use Disorder Identification Test Final Score (AUDIT): 33 Intervention/Follow-up: Alcohol Education  Substance Abuse History in the last 12 months:  History of alcohol/opioid dependence. Currently, on agonist therapy (Methadone). Denies illicit opiate use at present .   Consequences of Substance Abuse: Medical Consequences:  Liver damage, Possible death by overdose Legal Consequences:  Arrests, jail time, Loss of driving privilege. Family Consequences:  Family discord, divorce and or separation.  Previous  Psychotropic Medications: Had been on Abilify 10 mg, Lexapro 20 mgrs, Buspar 10 mgr, Methadone maintenance at 170 mg & Neurontin at 600 mgr.  Psychological Evaluations:  No   Past Medical History:  Past Medical History:  Diagnosis Date  . Arthritis   . Chronic pain   . Depression   . ETOH abuse   . Fibromyalgia   . Hep C w/ coma, chronic ([place])   . Hepatitis C   . Opiate abuse, continuous ([place])    on Methadone currently  . Suicide attempt ([place])    cut wrist  . Vision abnormalities     Past Surgical History:  Procedure Laterality Date  . ABDOMINAL HYSTERECTOMY    . APPENDECTOMY    . CESAREAN SECTION    . CHOLECYSTECTOMY    . KNEE SURGERY     Family History: Parents are alive, separated  Has one sibling . Family History  Problem Relation Age of Onset  . Multiple sclerosis Mother   . Hypertension Father    Family Psychiatric  History: Depression & alcohol abuse run in her family, no suicides in the family .  Tobacco Screening: Have you used any form of tobacco in the last 30 days? (Cigarettes, Smokeless Tobacco,  Cigars, and/or Pipes): Yes Tobacco use, Select all that apply: 5 or more cigarettes per day Are you interested in Tobacco Cessation Medications?: Yes, will notify MD for an order Counseled patient on smoking cessation including recognizing danger situations, developing coping skills and basic information about quitting provided: Refused/Declined practical counseling  Social History: Patient is currently separated from husband. Has 1 son. She states there has been increased marital discord recently & that they may now legally divorce.  Social History   Substance and Sexual Activity  Alcohol Use Yes   Comment: 2-500 ml boxes daily at least     Social History   Substance and Sexual Activity  Drug Use No    Additional Social History:  Allergies:   Allergies  Allergen Reactions  . Erythromycin Hives  . Lamictal [Lamotrigine] Dermatitis  . Sumatriptan Other  (See Comments)    Becomes angry   Lab Results:  Results for orders placed or performed during the hospital encounter of 01/21/18 (from the past 48 hour(s))  Hemoglobin A1c     Status: Abnormal   Collection Time: 01/22/18  6:38 AM  Result Value Ref Range   Hgb A1c MFr Bld 4.6 (L) 4.8 - 5.6 %    Comment: (NOTE) Pre diabetes:          5.7%-6.4% Diabetes:              >6.4% Glycemic control for   <7.0% adults with diabetes    Mean Plasma Glucose 85.32 mg/dL    Comment: Performed at [facility], [address] [place], [place] [address]  Lipid panel     Status: Abnormal   Collection Time: 01/22/18  6:38 AM  Result Value Ref Range   Cholesterol 222 (H) 0 - 200 mg/dL   Triglycerides 77 <150 mg/dL   HDL 119 >40 mg/dL   Total CHOL/HDL Ratio 1.9 RATIO   VLDL 15 0 - 40 mg/dL   LDL Cholesterol 88 0 - 99 mg/dL    Comment:        Total Cholesterol/HDL:CHD Risk Coronary Heart Disease Risk Table                     Men   Women  1/2 Average Risk   3.4   3.3  Average Risk       5.0   4.4  2 X Average Risk   9.6   7.1  3 X Average Risk  23.4   11.0        Use the calculated Patient Ratio above and the CHD Risk Table to determine the patient's CHD Risk.        ATP III CLASSIFICATION (LDL):  <100     mg/dL   Optimal  100-129  mg/dL   Near or Above                    Optimal  130-159  mg/dL   Borderline  160-189  mg/dL   High  >190     mg/dL   Very High Performed at [facility], [address] [place], [place] [address]   TSH     Status: Abnormal   Collection Time: 01/22/18  6:38 AM  Result Value Ref Range   TSH 6.755 (H) 0.350 - 4.500 uIU/mL    Comment: Performed by a 3rd Generation assay with a functional sensitivity of <=0.01 uIU/mL. Performed at [facility], [address] [place], [place]   [address]    Blood Alcohol level:  Lab Results  Component Value Date   ETH <10 01/21/2018   ETH 152 (H) 80/88/1103   Metabolic Disorder  Labs:  Lab Results  Component Value Date   HGBA1C 4.6 (L) 01/22/2018   MPG 85.32 01/22/2018   MPG 91.06 09/14/2017   No results found for: PROLACTIN Lab Results  Component Value Date   CHOL 222 (H) 01/22/2018   TRIG 77 01/22/2018   HDL 119 01/22/2018   CHOLHDL 1.9 01/22/2018   VLDL 15 01/22/2018   LDLCALC 88 01/22/2018   LDLCALC 105 (H) 09/14/2017   Current Medications: Current Facility-Administered Medications  Medication Dose Route Frequency Provider Last Rate Last Dose  . acetaminophen (TYLENOL) tablet 650 mg  650 mg Oral Q6H PRN Lindon Romp A, NP      . alum & mag hydroxide-simeth (MAALOX/MYLANTA) 200-200-20 MG/5ML suspension 30 mL  30 mL Oral Q4H PRN Lindon Romp A, NP      . ARIPiprazole (ABILIFY) tablet 5 mg  5 mg Oral Daily Lindon Romp A, NP   5 mg at 01/22/18 0807  . cloNIDine (CATAPRES) tablet 0.1 mg  0.1 mg Oral TID PC & HS Lindon Romp A, NP   0.1 mg at 01/22/18 0807  . DULoxetine (CYMBALTA) DR capsule 20 mg  20 mg Oral Daily Lindon Romp A, NP   20 mg at 01/22/18 0813  . etodolac (LODINE) capsule 400 mg  400 mg Oral BID Sharma Covert, MD      . gabapentin (NEURONTIN) capsule 600 mg  600 mg Oral TID Lindon Romp A, NP   600 mg at 01/22/18 1594  . hydrOXYzine (ATARAX/VISTARIL) tablet 50 mg  50 mg Oral Q6H PRN Lindon Romp A, NP      . levothyroxine (SYNTHROID, LEVOTHROID) tablet 25 mcg  25 mcg Oral QAC breakfast Lindon Romp A, NP   25 mcg at 01/22/18 0631  . loperamide (IMODIUM) capsule 2-4 mg  2-4 mg Oral PRN Lindon Romp A, NP      . LORazepam (ATIVAN) tablet 1 mg  1 mg Oral Q6H PRN Lindon Romp A, NP      . LORazepam (ATIVAN) tablet 1 mg  1 mg Oral QID Lindon Romp A, NP   1 mg at 01/22/18 5859   Followed by  . [START ON 01/23/2018] LORazepam (ATIVAN) tablet 1 mg  1 mg Oral TID Rozetta Nunnery, NP       Followed by  . [START ON 01/24/2018] LORazepam (ATIVAN) tablet 1 mg  1 mg Oral BID Rozetta Nunnery, NP       Followed by  . [START ON 01/25/2018] LORazepam  (ATIVAN) tablet 1 mg  1 mg Oral Daily Lindon Romp A, NP      . magnesium hydroxide (MILK OF MAGNESIA) suspension 30 mL  30 mL Oral Daily PRN Lindon Romp A, NP      . methadone (DOLOPHINE) tablet 170 mg  170 mg Oral Daily Sharma Covert, MD   170 mg at 01/22/18 0929  . multivitamin with minerals tablet 1 tablet  1 tablet Oral Daily Lindon Romp A, NP   1 tablet at 01/22/18 0807  . nicotine (NICODERM CQ - dosed in mg/24 hours) patch 21 mg  21 mg Transdermal Daily Sharma Covert, MD   21 mg at 01/22/18 0954  . ondansetron (ZOFRAN-ODT) disintegrating tablet 4 mg  4 mg Oral Q6H PRN Rozetta Nunnery, NP      .  thiamine (VITAMIN B-1) tablet 100 mg  100 mg Oral Daily Berry, Jason A, NP   100 mg at 01/22/18 0807  . traZODone (DESYREL) tablet 100 mg  100 mg Oral QHS Berry, Jason A, NP   100 mg at 01/22/18 0033   PTA Medications: Medications Prior to Admission  Medication Sig Dispense Refill Last Dose  . ARIPiprazole (ABILIFY) 5 MG tablet Take 1 tablet (5 mg total) by mouth daily. For mood control 30 tablet 0 01/20/2018 at Unknown time  . cloNIDine (CATAPRES) 0.1 MG tablet Take 1 tablet (0.1 mg total) by mouth 4 (four) times daily - after meals and at bedtime. Anxiety & high blood pressure 120 tablet 0 01/21/2018 at Unknown time  . DULoxetine (CYMBALTA) 20 MG capsule Take 1 capsule (20 mg total) by mouth daily. For depression 30 capsule 0 01/20/2018 at Unknown time  . etodolac (LODINE) 400 MG tablet Take 400 mg by mouth 2 (two) times daily.  2 01/20/2018 at Unknown time  . gabapentin (NEURONTIN) 300 MG capsule Take 2 capsules (600 mg total) by mouth 3 (three) times daily. For agitation 180 capsule 0 01/21/2018 at Unknown time  . HUMIRA PEN 40 MG/0.4ML PNKT Inject 0.4 mLs into the muscle every 14 (fourteen) days.   01/20/2018 at Unknown time  . hydrOXYzine (ATARAX/VISTARIL) 50 MG tablet Take 1 tablet (50 mg) by mouth every 6 hours as needed: For anxiety 120 tablet 0 prn at unk  . levothyroxine (SYNTHROID,  LEVOTHROID) 25 MCG tablet Take 1 tablet (25 mcg total) by mouth daily before breakfast. For low thyroid function 30 tablet 0 01/21/2018 at 0700  . methadone (DOLOPHINE) 10 MG/ML solution Take 16 mLs (160 mg total) by mouth daily. For opioid addiction (Patient taking differently: Take 170 mg by mouth daily. For opioid addiction)  0 01/21/2018 at 0800  . Multiple Vitamin (MULTIVITAMIN WITH MINERALS) TABS tablet Take 1 tablet by mouth 3 (three) times daily. For low Vitamin (Patient not taking: Reported on 01/21/2018) 60 tablet 0 Not Taking at Unknown time  . nicotine (NICODERM CQ - DOSED IN MG/24 HOURS) 21 mg/24hr patch Place 1 patch (21 mg total) onto the skin daily. For smoking cessation (Patient not taking: Reported on 01/21/2018) 28 patch 0 Not Taking at Unknown time  . traZODone (DESYREL) 100 MG tablet Take 1 tablet (100 mg total) by mouth at bedtime. For sleep 30 tablet 0 01/20/2018 at Unknown time   Musculoskeletal: Strength & Muscle Tone: within normal limits  Gait & Station: normal Patient leans: N/A  Psychiatric Specialty Exam: Physical Exam  Nursing note and vitals reviewed. Constitutional: She appears well-developed.  HENT:  Head: Normocephalic.  Eyes: Pupils are equal, round, and reactive to light.  Neck: Normal range of motion.  Cardiovascular: Normal rate.  Respiratory: Effort normal.  GI: Soft.  Genitourinary:  Genitourinary Comments: Deferred  Musculoskeletal: Normal range of motion.  Neurological: She is alert.  Skin: Skin is warm.    Review of Systems  Constitutional: Negative.   HENT: Negative.   Eyes: Negative.   Respiratory: Negative.   Cardiovascular: Negative.   Gastrointestinal: Positive for nausea. Negative for vomiting.  Genitourinary: Negative.   Musculoskeletal:       Reports chronic pain  Skin: Negative.   Neurological: Negative for seizures.       No history of seizures   Endo/Heme/Allergies: Negative.   Psychiatric/Behavioral: Positive for depression,  substance abuse and suicidal ideas.  All other systems reviewed and are negative.   Blood pressure (!)   128/96, pulse (!) 107, temperature 99.2 F (37.3 C), temperature source Oral, resp. rate 18, height 5' 2" (1.575 m), weight 64.9 kg (143 lb).Body mass index is 26.16 kg/m.  General Appearance: Fairly Groomed  Eye Contact:  Good  Speech:  Normal Rate  Volume:  Decreased  Mood:  Depressed, Hopeless and Worthless  Affect:  Congruent, Tearful and sad, constricted,  Thought Process:  Coherent, Linear and Descriptions of Associations: Intact  Orientation:  Other:  fully alert, oriented x 4, attentive   Thought Content:  Denies any hallucinations, delusions, not internally preoccupied   Suicidal Thoughts:  Yes.  without intent/plan, contracts for safety on unit   Homicidal Thoughts:  Denies  Memory:  Immediate;   Good Recent;   Good Remote;   Good recent and remote grossly intact   Judgement:  Fair  Insight:  Fair  Psychomotor Activity:  Normal- no tremors, no diaphoresis or overt psychomotor restlessness   Concentration:  Concentration: Good and Attention Span: Good  Recall:  Good  Fund of Knowledge:  Good  Language:  Good  Akathisia:  Negative  Handed:  Right  AIMS (if indicated):     Assets:  Desire for Improvement Resilience  ADL's:  Intact  Cognition:  WNL  Sleep:  Number of Hours: 4.75   Treatment Plan/Recommendations: 1. Admit for crisis management and stabilization, estimated length of stay 3-5 days.   2. Medication management to reduce current symptoms to base line and improve the patient's overall level of functioning: See MAR, Md's SRA & treatment plan.   Observation Level/Precautions:  15 minute checks  Laboratory:  Per ED, UDS clear  Psychotherapy: Group therapy   Medications: See MAR.  Consultations:  As needed   Discharge Concerns: safety, mood stability  Estimated LOS: 2-4 days   Other: Admit to the [facility]-hall.   Physician Treatment Plan for Primary  Diagnosis:  Alcohol use disorder, moderated dependence.  Long Term Goal(s): Improvement in symptoms so as ready for discharge  Short Term Goals: Ability to disclose and discuss suicidal ideas and Ability to demonstrate self-control will improve  Physician Treatment Plan for Secondary Diagnosis: Alcohol Dependence  Long Term Goal(s): Improvement in symptoms so as ready for discharge  Short Term Goals: Ability to identify and develop effective coping behaviors will improve and Compliance with prescribed medications will improve  I certify that inpatient services furnished can reasonably be expected to improve the patient's condition.    Agnes Nwoko, NP, PMHNP, FNP-BC. 6/7/201910:37 AM   I have reviewed NP's Note, assessement, diagnosis and plan, and agree. I have also met with patient and completed suicide risk assessment.  Kandis Jester is a 37 y/o F with history of MDD and alcohol use disorder who was admitted voluntarily from [facility] where he was brought in by her husband with worsening depression, SI with plan to cut her wrists, anxiety, and relapse of alcohol use. Pt shares recent stressor of homelessness after being asked to leave her halfway house due to alcohol use. Pt was medically cleared and then transferred to [facility] for additional treatment and evaluation.   Upon initial evaluation, pt shares, "My husband and I separated back in February. He's getting his financial situation stable, and I was living in sober living. I slacked off on my meds for two weeks, and then I got more depressed. Things went downhill. It's been rough. I was drinking and I got tested, and I had to leave the house." Pt reports that when faced with stressor of homelessness   she had worsening of her mood symptoms and was contemplating suicide via cutting her wrists. Pt shares that her husband picked her up and brought her to the hospital. She endorses depressive symptoms of depressed mood, low energy, poor  concentration, anhedonia, guilty feelings, and decreased appetite. She denies HI/AH/VH. She denies symptoms of mania, OCD, and PTSD. She reports that she has been drinking for about 2-3 weeks, and she has been consuming about a 20 ounce bottle of mouthwash daily, explaining that she felt it would be harder for the staff at sober living to notice. She denies other illicit substance use.  Discussed with patient about treatment options. She reports that her medications had been helpful prior to stopping them, and she would like to get back on them. Pt agrees to be resumed on gabapentin, abilify, trazodone, cymbalta, and methadone. Discussed with patient about possible transition to long-acting injectable form of abilify, and she would be open to that option. She is interested in speaking with SW team about referral to substance use treatment options. Pt was in agreement with the above plan, and she had no further questions, comments, or concerns.   PLAN OF CARE:   -Admit to inpatient level of care  -MDD, recurrent, severe, without psychosis             -Continue cymbalta DR 55m po qDay             -Change abilify 535mpo qDay to abilify 1027mo qDay  -Alcohol withdrawal              -Continue librium 42m13m q6h prn withdrawal  -Anxiety              -Continue gabapentin 600mg103mTID             -Continue vistaril 50mg 105m6h prn anxiety              -Continue clonidine 0.1mg TI23mith meals and at bedtime              -hypothyroidism             -Continue synthroid 42mcg q85m -opiate dependence              -Continue methadone 170mg po 63m  -insomnia              -Continue trazodone 100mg po q14m-Encourage participation in groups and therapeutic milieu  -Disposition planning will be ongoing   ChristopheMaris Berger

## 2018-01-22 NOTE — [facility] Group Notes (Signed)
LCSW Group Therapy Note 01/22/2018 2:16 PM  Type of Therapy and Topic: Group Therapy: Avoiding Self-Sabotaging and Enabling Behaviors  Participation Level: Active  Description of Group:  In this group, patients will learn how to identify obstacles, self-sabotaging and enabling behaviors, as well as: what are they, why do we do them and what needs these behaviors meet. Discuss unhealthy relationships and how to have positive healthy boundaries with those that sabotage and enable. Explore aspects of self-sabotage and enabling in yourself and how to limit these self-destructive behaviors in everyday life.  Therapeutic Goals: 1. Patient will identify one obstacle that relates to self-sabotage and enabling behaviors 2. Patient will identify one personal self-sabotaging or enabling behavior they did prior to admission 3. Patient will state a plan to change the above identified behavior 4. Patient will demonstrate ability to communicate their needs through discussion and/or role play.   Summary of Patient Progress:  Carinne was engaged and participated throughout the group's discussion. Liliann states that her self sabotaging behavior was when she stopped going to her AA meetings.     Therapeutic Modalities:  Cognitive Behavioral Therapy Person-Centered Therapy Motivational Interviewing   Baldo Daub LCSWA Clinical Social Worker

## 2018-01-22 NOTE — Progress Notes (Signed)
EKG completed- Results placed on outside of shadow chart

## 2018-01-22 NOTE — Progress Notes (Signed)
Recreation Therapy Notes  Date: 6.7.19 Time: 0930 Location: [facility] Hall Dayroom  Group Topic: Stress Management  Goal Area(s) Addresses:  Patient will verbalize importance of using healthy stress management.  Patient will identify positive emotions associated with healthy stress management.   Behavioral Response: Engaged  Intervention: Stress Management  Activity :  LRT introduced the stress management technique of meditation.  LRT played that focused on being resilient in the face of struggle.  Patients were to follow along as the meditation played to engage in the activity.  Education:  Stress Management, Discharge Planning.   Education Outcome: Acknowledges edcuation/In group clarification offered/Needs additional education  Clinical Observations/Feedback: Pt attended and participated in the activity.    Caroll Rancher, LRT/CTRS         Lillia Abed, Kyannah Climer A 01/22/2018 11:53 AM

## 2018-01-23 DIAGNOSIS — Z630 Problems in relationship with spouse or partner: Secondary | ICD-10-CM

## 2018-01-23 DIAGNOSIS — K5900 Constipation, unspecified: Secondary | ICD-10-CM

## 2018-01-23 LAB — PROLACTIN: Prolactin: 20.2 ng/mL (ref 4.8–23.3)

## 2018-01-23 MED ORDER — MAGNESIUM CITRATE PO SOLN
1.0000 | Freq: Once | ORAL | Status: AC
  Administered 2018-01-23: 1 via ORAL

## 2018-01-23 NOTE — Progress Notes (Signed)
[facility] Group Notes:  (Nursing/MHT/Case Management/Adjunct)  Date:  01/23/2018  Time:  2045  Type of Therapy:  wrap up group  Participation Level:  Active  Participation Quality:  Appropriate, Attentive, Sharing and Supportive  Affect:  Appropriate  Cognitive:  Appropriate  Insight:  Improving  Engagement in Group:  Engaged  Modes of Intervention:  Clarification, Education and Support  Summary of Progress/Problems:  Carla Park 01/23/2018, 10:08 PM

## 2018-01-23 NOTE — Progress Notes (Signed)
[facility] MD Progress Note  01/23/2018 3:32 PM Carla Park  MRN:  [id number]  Subjective: Patient seen sitting in day room..  Patient is awake alert and oriented x3.  Reports she is not feeling well today. "  Feeling down" patient reports she.  She had a visit from her husband on last night.  States the meeting did not go as anticipated.  States she always seems to burn bridges and is hopeful to have a family session prior to discharge.  Reports she is taking her medications as prescribed and tolerating them well.  Denies suicidal or homicidal ideation during this assessment.  Rates her depression 10 out of 10, with 10 being the worst. patient is requesting medication for constipation.  Reports a good appetite and states she is resting well throughout the night.  Support encouragement reassurance was provided  History: per assessment note-37 year old Caucasian female. She is  known to our unit from prior admissions all related to substance use disorder. She presented to ED voluntarily yesterday with husband with complaints of worsening suicidal ideations of 2 days withy plans to cut her wrist after relapsing on alcohol by drinking mouth wash. She has been living in a sober living environment prior to this. Patient reports being on methadone maintenance at the [facility].   Principal Problem: MDD (major depressive disorder), recurrent severe, without psychosis (HCC) Diagnosis:   Patient Active Problem List   Diagnosis Date Noted  . Alcohol use disorder, severe, dependence (HCC) [F10.20] 01/22/2018  . MDD (major depressive disorder) [F32.9] 09/12/2017  . Cervical radiculopathy at C6 [M54.12] 05/19/2017  . Hepatitis C infection [B19.20] 05/19/2017  . Multiple joint pain [M25.50] 02/06/2017  . 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 Memory loss [R41.3] 02/06/2017  . Family history of MS (multiple sclerosis) [Z82.0] 02/06/2017  . Alcohol use disorder, moderate, dependence (HCC)  [F10.20] 01/28/2017  . MDD (major depressive disorder), recurrent severe, without psychosis (HCC) [F33.2] 01/14/2017  . 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 Alcohol withdrawal (HCC) [F10.239] 05/29/2015  . Chronic pain [G89.29]   . Fibromyalgia [M79.7]   . 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 Migraines [G43.909] 11/18/2012  . RA (rheumatoid arthritis) (HCC) [M06.9] 11/18/2012   Total Time spent with patient: 15 minutes  Past Psychiatric History:   Past Medical History:  Past Medical History:  Diagnosis Date  . Arthritis   . Chronic pain   . Depression   . ETOH abuse   . Fibromyalgia   . Hep C w/ coma, chronic (HCC)   . Hepatitis C   . Opiate abuse, continuous (HCC)    on Methadone currently  . Suicide attempt (HCC)    cut wrist  . Vision abnormalities     Past Surgical History:  Procedure Laterality Date  . ABDOMINAL HYSTERECTOMY    . APPENDECTOMY    . CESAREAN SECTION    . CHOLECYSTECTOMY    . KNEE SURGERY     Family History:  Family History  Problem Relation Age of Onset  . Multiple sclerosis Mother   . Hypertension Father    Family Psychiatric  History: Social History:  Social History   Substance and Sexual Activity  Alcohol Use Yes   Comment: 2-500 ml boxes daily at least     Social History   Substance and Sexual Activity  Drug Use No    Social History   Socioeconomic History  . Marital status: Married  Spouse name: Not on file  . Number of children: Not on file  . Years of education: Not on file  . Highest education level: Not on file  Occupational History  . Not on file  Social Needs  . Financial resource strain: Not on file  . Food insecurity:    Worry: Not on file    Inability: Not on file  . Transportation needs:    Medical: Not on file    Non-medical: Not on file  Tobacco Use  . Smoking status: Current Every Day Smoker    Packs/day: 1.00    Types: Cigarettes  . Smokeless  tobacco: Never Used  Substance and Sexual Activity  . Alcohol use: Yes    Comment: 2-500 ml boxes daily at least  . Drug use: No  . Sexual activity: Yes    Birth control/protection: Surgical  Lifestyle  . Physical activity:    Days per week: Not on file    Minutes per session: Not on file  . Stress: Not on file  Relationships  . Social connections:    Talks on phone: Not on file    Gets together: Not on file    Attends religious service: Not on file    Active member of club or organization: Not on file    Attends meetings of clubs or organizations: Not on file    Relationship status: Not on file  Other Topics Concern  . Not on file  Social History Narrative  . Not on file   Additional Social History:                         Sleep: Fair  Appetite:  Fair  Current Medications: Current Facility-Administered Medications  Medication Dose Route Frequency Provider Last Rate Last Dose  . acetaminophen (TYLENOL) tablet 650 mg  650 mg Oral Q6H PRN Nira Conn A, NP      . alum & mag hydroxide-simeth (MAALOX/MYLANTA) 200-200-20 MG/5ML suspension 30 mL  30 mL Oral Q4H PRN Nira Conn A, NP      . ARIPiprazole (ABILIFY) tablet 10 mg  10 mg Oral Daily Micheal Likens, MD   10 mg at 01/23/18 0807  . chlordiazePOXIDE (LIBRIUM) capsule 25 mg  25 mg Oral QID PRN Armandina Stammer I, NP   25 mg at 01/23/18 1116  . cloNIDine (CATAPRES) tablet 0.1 mg  0.1 mg Oral TID PC & HS Nira Conn A, NP   0.1 mg at 01/23/18 1200  . DULoxetine (CYMBALTA) DR capsule 20 mg  20 mg Oral Daily Nira Conn A, NP   20 mg at 01/23/18 0809  . etodolac (LODINE) capsule 400 mg  400 mg Oral BID Antonieta Pert, MD   400 mg at 01/23/18 0806  . gabapentin (NEURONTIN) capsule 600 mg  600 mg Oral TID Nira Conn A, NP   600 mg at 01/23/18 1201  . hydrOXYzine (ATARAX/VISTARIL) tablet 50 mg  50 mg Oral Q6H PRN Nira Conn A, NP   50 mg at 01/22/18 1643  . levothyroxine (SYNTHROID, LEVOTHROID) tablet  25 mcg  25 mcg Oral QAC breakfast Nira Conn A, NP   25 mcg at 01/23/18 [phone]  . loperamide (IMODIUM) capsule 2-4 mg  2-4 mg Oral PRN Nira Conn A, NP      . magnesium hydroxide (MILK OF MAGNESIA) suspension 30 mL  30 mL Oral Daily PRN Nira Conn A, NP   30 mL at 01/22/18 1202  .  methadone (DOLOPHINE) tablet 170 mg  170 mg Oral Daily Antonieta Pert, MD   170 mg at 01/23/18 0801  . multivitamin with minerals tablet 1 tablet  1 tablet Oral Daily Nira Conn A, NP   1 tablet at 01/23/18 0806  . nicotine (NICODERM CQ - dosed in mg/24 hours) patch 21 mg  21 mg Transdermal Daily Antonieta Pert, MD   21 mg at 01/23/18 0800  . ondansetron (ZOFRAN-ODT) disintegrating tablet 4 mg  4 mg Oral Q6H PRN Nira Conn A, NP      . thiamine (VITAMIN B-1) tablet 100 mg  100 mg Oral Daily Nira Conn A, NP   100 mg at 01/23/18 0806  . traZODone (DESYREL) tablet 100 mg  100 mg Oral QHS Nira Conn A, NP   100 mg at 01/22/18 2208    Lab Results:  Results for orders placed or performed during the hospital encounter of 01/21/18 (from the past 48 hour(s))  Hemoglobin A1c     Status: Abnormal   Collection Time: 01/22/18  6:38 AM  Result Value Ref Range   Hgb A1c MFr Bld 4.6 (L) 4.8 - 5.6 %    Comment: (NOTE) Pre diabetes:          5.7%-6.4% Diabetes:              >6.4% Glycemic control for   <7.0% adults with diabetes    Mean Plasma Glucose 85.32 mg/dL    Comment: Performed at [facility], [address] [place], [place] [address]  Lipid panel     Status: Abnormal   Collection Time: 01/22/18  6:38 AM  Result Value Ref Range   Cholesterol 222 (H) 0 - 200 mg/dL   Triglycerides 77 <767 mg/dL   HDL 341 >93 mg/dL   Total CHOL/HDL Ratio 1.9 RATIO   VLDL 15 0 - 40 mg/dL   LDL Cholesterol 88 0 - 99 mg/dL    Comment:        Total Cholesterol/HDL:CHD Risk Coronary Heart Disease Risk Table                     Men   Women  1/2 Average Risk   3.4   3.3  Average Risk       5.0   4.4  2 X  Average Risk   9.6   7.1  3 X Average Risk  23.4   11.0        Use the calculated Patient Ratio above and the CHD Risk Table to determine the patient's CHD Risk.        ATP III CLASSIFICATION (LDL):  <100     mg/dL   Optimal  790-240  mg/dL   Near or Above                    Optimal  130-159  mg/dL   Borderline  973-532  mg/dL   High  >992     mg/dL   Very High Performed at [facility], [address] [place], [place] [address]   Prolactin     Status: None   Collection Time: 01/22/18  6:38 AM  Result Value Ref Range   Prolactin 20.2 4.8 - 23.3 ng/mL    Comment: (NOTE) Performed At: [facility] [address] [place], [place] [id number] Jolene Schimke MD [id number] Performed at [facility], [address], [place], [place] [address]   TSH  Status: Abnormal   Collection Time: 01/22/18  6:38 AM  Result Value Ref Range   TSH 6.755 (H) 0.350 - 4.500 uIU/mL    Comment: Performed by a 3rd Generation assay with a functional sensitivity of <=0.01 uIU/mL. Performed at [facility], [address], [place], [place] [address]     Blood Alcohol level:  Lab Results  Component Value Date   ETH <10 01/21/2018   ETH 152 (H) 09/12/2017    Metabolic Disorder Labs: Lab Results  Component Value Date   HGBA1C 4.6 (L) 01/22/2018   MPG 85.32 01/22/2018   MPG 91.06 09/14/2017   Lab Results  Component Value Date   PROLACTIN 20.2 01/22/2018   Lab Results  Component Value Date   CHOL 222 (H) 01/22/2018   TRIG 77 01/22/2018   HDL 119 01/22/2018   CHOLHDL 1.9 01/22/2018   VLDL 15 01/22/2018   LDLCALC 88 01/22/2018   LDLCALC 105 (H) 09/14/2017    Physical Findings: AIMS: Facial and Oral Movements Muscles of Facial Expression: None, normal Lips and Perioral Area: None, normal Jaw: None, normal Tongue: None, normal,Extremity Movements Upper (arms, wrists, hands, fingers): None, normal Lower (legs, knees,  ankles, toes): None, normal, Trunk Movements Neck, shoulders, hips: None, normal, Overall Severity Severity of abnormal movements (highest score from questions above): None, normal Incapacitation due to abnormal movements: None, normal Patient's awareness of abnormal movements (rate only patient's report): No Awareness, Dental Status Current problems with teeth and/or dentures?: No Does patient usually wear dentures?: Yes  CIWA:  CIWA-Ar Total: 5 COWS:     Musculoskeletal: Strength & Muscle Tone: within normal limits Gait & Station: normal Patient leans: N/A  Psychiatric Specialty Exam: Physical Exam  Nursing note and vitals reviewed. Constitutional: She is oriented to person, place, and time. She appears well-developed.  Neurological: She is alert and oriented to person, place, and time.  Psychiatric: She has a normal mood and affect. Her behavior is normal.    ROS  Blood pressure (!) 135/103, pulse 97, temperature 98.6 F (37 C), temperature source Oral, resp. rate 18, height 5\' 2"  (1.575 m), weight 64.9 kg (143 lb).Body mass index is 26.16 kg/m.  General Appearance: Casual  Eye Contact:  Fair  Speech:  Clear and Coherent  Volume:  Normal  Mood:  Anxious, Depressed and Dysphoric  Affect:  Depressed and Flat  Thought Process:  Coherent  Orientation:  Full (Time, Place, and Person)  Thought Content:  Hallucinations: None and Rumination  Suicidal Thoughts:  Yes.  with intent/plan  Homicidal Thoughts:  No  Memory:  Immediate;   Fair Recent;   Fair Remote;   Fair  Judgement:  Fair  Insight:  Fair  Psychomotor Activity:  Normal  Concentration:  Concentration: Fair  Recall:  of Knowledge:  Fair  Language:  Fair  Akathisia:  No  Handed:  Right  AIMS (if indicated):     Assets:  Communication Skills Desire for Improvement Resilience Social Support Talents/Skills  ADL's:  Intact  Cognition:  WNL  Sleep:  Number of Hours: 5.75     Treatment Plan  Summary: Daily contact with patient to assess and evaluate symptoms and progress in treatment and Medication management  Continue with current treatment plan on 01/23/2018 as listed below except where noted   Substance abuse:  Continue Librium 25 mg PRN for anxiety and Etoh withdraws  Continue methadone 170 mg PO QD   Mood stablization  Continue with Neurontin 600 mg  Continue Cymbalta 20 mg    Continue  Abilify 10 mg   Insomnia                  Continue with Trazodone 100 mg PO  QHS   Will continue to monitor vitals ,medication compliance and treatment side effects while patient is here.   CSW will continue  working on disposition.  Patient to participate in therapeutic milieu   Oneta Rack, NP 01/23/2018, 3:32 PM

## 2018-01-23 NOTE — Progress Notes (Signed)
D:  Carla Park was up and visible on the unit this evening.  She has been interacting well with peers.  She has been attending groups.  She continues to voice passive SI but will contract for safety on the unit.  She was surprised this evening because her husband came to visit.  "He told me that he doesn't want everything to end but wants me to get better."  She denies any pain or discomfort and appears to be in no physical distress. A:  1:1 with RN for support and encouragement.  Medications as ordered.  Q 15 minute checks maintained for safety.  Encouraged participation in group and unit activities.   R:  [name] remains safe on the unit.  We will continue to monitor the progress towards her goals.

## 2018-01-23 NOTE — Plan of Care (Signed)
Carla Park continues to talk about her relationship with her husband and how he has been back and forth about staying together or separating.  She stated that this just made her feel worse today and "I'm not coping well."  Encouraged her to write about her feelings and work on how she will be able to talk with her husband about her feelings.  We will continue to monitor the progress towards her goals.

## 2018-01-23 NOTE — Progress Notes (Signed)
D. Pt presents with an anxious affect/mood.Pt is calm and cooperative, pleasant during interactions- observed in dayroom interacting well with peers. Pt continues to endorse passive SI and is able to verbally contract for safety. Per pt's self inventory, pt rates her depression, hopelessness and anxiety a 9/8/9, respectively. Pt writes that her most important goal today is to work on "suicidal thoughts and constipation". A. Labs and vitals monitored. Pt compliant with medications. Pt supported emotionally and encouraged to express concerns and ask questions.   R. Pt remains safe with 15 minute checks. Will continue POC.

## 2018-01-23 NOTE — [facility] Group Notes (Signed)
LCSW Group Therapy Note  01/23/2018   10:00--11:00am   Type of Therapy and Topic:  Group Therapy: Anger Cues and Responses  Participation Level:  Active   Description of Group:   In this group, patients learned how to recognize the physical, cognitive, emotional, and behavioral responses they have to anger-provoking situations.  They identified a recent time they became angry and how they reacted.  They analyzed how their reaction was possibly beneficial and how it was possibly unhelpful.  The group discussed a variety of healthier coping skills that could help with such a situation in the future.  Several grounding techniques were practiced briefly.  Therapeutic Goals: 1. Patients will remember their last incident of anger and how they felt emotionally and physically, what their thoughts were at the time, and how they behaved. 2. Patients will identify how their behavior at that time worked for them, as well as how it worked against them. 3. Patients will explore possible new behaviors to use in future anger situations. 4. Patients will learn that anger itself is normal and cannot be eliminated, and that healthier reactions can assist with resolving conflict rather than worsening situations.  Summary of Patient Progress:  The patient shared that her most recent time of anger was last night and said her husband had gone through her phone in a detailed manner and seen things she had not intended him to see, so they had a fight about it once she got here to the hospital.  She tried to keep quiet and pretended not to be as angry as she truly was, and when he came to visit her, they talked about it more and she cried.  She was very supportive of other patients, telling them how she could relate to what they were saying and telling them they are not alone.  Her affect was blunted and depressed and she was a little tearful throughout group..  Therapeutic Modalities:   Cognitive Behavioral  Therapy  Lynnell Chad

## 2018-01-23 NOTE — Progress Notes (Signed)
D:  Carla Park has been up and visible on the unit this evening.  She has been attending groups and interacts well with staff and peers.  She reported that she had a difficult visit with her husband this evening.  He apparently was snooping on her phone and found that she had been texting and ex from the past.  She was upset and tearful because she feels that she may have "blown my chance at getting back together."  She went on to tell RN that everything started a while ago when her husband said that he wanted to separate.  "He keeps telling me he wants to get back together then says that he wants to stay separated.  I don't know what to do."  She continues to voice passive SI but is able to contract for safety on the unit.  She denies HI or A/V hallucinations.  She was very tearful and anxious during interaction. A:  1:1 with RN for support and encouragement.  Medications as ordered.  Q 15 minute checks maintained for safety.  Encouraged participation in group and unit activities.   R:  Carla Park remains safe on the unit.  We will continue to monitor the progress towards her goals.

## 2018-01-23 NOTE — [facility] Group Notes (Signed)
Adult Psychoeducational Group Note  Date:  01/23/2018 Time:  9:00 AM  Group Topic/Focus: Orientation/Goals Group Goals Group:   The focus of this group is to help patients establish daily goals to achieve during treatment and discuss how the patient can incorporate goal setting into their daily lives to aide in recovery.  Participation Level:  Did Not Attend  Modes of Intervention:  Discussion, Education, Orientation, Socialization and Support  Additional Comments:  Patient did not attend.  Dannica Bickham A Sylvie Mifsud 01/23/2018, 9:45 AM 

## 2018-01-23 NOTE — Progress Notes (Signed)
The patient attended the evening A.A.meting but fell asleep.

## 2018-01-24 DIAGNOSIS — R450 Nervousness: Secondary | ICD-10-CM

## 2018-01-24 NOTE — Progress Notes (Signed)
[facility] Group Notes:  (Nursing/MHT/Case Management/Adjunct)  Date:  01/24/2018  Time:  2:37 PM  Type of Therapy:  Nurse Education  Participation Level:  Active  Participation Quality:  Appropriate, Sharing and Supportive  Affect:  Appropriate  Cognitive:  Alert, Appropriate and Oriented  Insight:  Appropriate and Good  Engagement in Group:  Engaged and Supportive  Modes of Intervention:  Activity, Discussion and Socialization  Summary of Progress/Problems: Patient participated appropriately and had good information to share with the group.  Carla Park 01/24/2018, 2:37 PM

## 2018-01-24 NOTE — Progress Notes (Signed)
[facility] MD Progress Note  01/24/2018 11:31 AM Carla Park  MRN:  [id number]  Subjective: Carla Park seen sitting in day room playing cards with peers.  She is awake alert oriented x3.  Reports overall her mood has improved as she was able to schedule family session with her husband.  Reports she is excited that he is receptive to a family meeting.  (States Wednesday would be the best day.) rates her depression 5 out of 10 with 10 being the worst.  Denies suicidal homicidal ideation.  Reports increased anxiety.  Reports she is taking her medications as prescribed and tolerating well.  Reports a good appetite.  States she is rested well on last night.  Patient reports she has been attending group sessions and feels that the sessions are helping with her mood as able to discuss her feelings.  Support encouragement and reassurance was provided.  History: per assessment note-37 year old Caucasian female. She is  known to our unit from prior admissions all related to substance use disorder. She presented to ED voluntarily yesterday with husband with complaints of worsening suicidal ideations of 2 days withy plans to cut her wrist after relapsing on alcohol by drinking mouth wash. She has been living in a sober living environment prior to this. Patient reports being on methadone maintenance at the [facility].   Principal Problem: MDD (major depressive disorder), recurrent severe, without psychosis (HCC) Diagnosis:   Patient Active Problem List   Diagnosis Date Noted  . Alcohol use disorder, severe, dependence (HCC) [F10.20] 01/22/2018  . MDD (major depressive disorder) [F32.9] 09/12/2017  . Cervical radiculopathy at C6 [M54.12] 05/19/2017  . Hepatitis C infection [B19.20] 05/19/2017  . Multiple joint pain [M25.50] 02/06/2017  . 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 Memory loss [R41.3] 02/06/2017  . Family history of MS (multiple sclerosis) [Z82.0] 02/06/2017  . Alcohol use disorder,  moderate, dependence (HCC) [F10.20] 01/28/2017  . MDD (major depressive disorder), recurrent severe, without psychosis (HCC) [F33.2] 01/14/2017  . 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 Alcohol withdrawal (HCC) [F10.239] 05/29/2015  . Chronic pain [G89.29]   . Fibromyalgia [M79.7]   . 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 Migraines [G43.909] 11/18/2012  . RA (rheumatoid arthritis) (HCC) [M06.9] 11/18/2012   Total Time spent with patient: 15 minutes  Past Psychiatric History:   Past Medical History:  Past Medical History:  Diagnosis Date  . Arthritis   . Chronic pain   . Depression   . ETOH abuse   . Fibromyalgia   . Hep C w/ coma, chronic (HCC)   . Hepatitis C   . Opiate abuse, continuous (HCC)    on Methadone currently  . Suicide attempt (HCC)    cut wrist  . Vision abnormalities     Past Surgical History:  Procedure Laterality Date  . ABDOMINAL HYSTERECTOMY    . APPENDECTOMY    . CESAREAN SECTION    . CHOLECYSTECTOMY    . KNEE SURGERY     Family History:  Family History  Problem Relation Age of Onset  . Multiple sclerosis Mother   . Hypertension Father    Family Psychiatric  History: Social History:  Social History   Substance and Sexual Activity  Alcohol Use Yes   Comment: 2-500 ml boxes daily at least     Social History   Substance and Sexual Activity  Drug Use No    Social History   Socioeconomic  History  . Marital status: Married    Spouse name: Not on file  . Number of children: Not on file  . Years of education: Not on file  . Highest education level: Not on file  Occupational History  . Not on file  Social Needs  . Financial resource strain: Not on file  . Food insecurity:    Worry: Not on file    Inability: Not on file  . Transportation needs:    Medical: Not on file    Non-medical: Not on file  Tobacco Use  . Smoking status: Current Every Day Smoker    Packs/day: 1.00    Types:  Cigarettes  . Smokeless tobacco: Never Used  Substance and Sexual Activity  . Alcohol use: Yes    Comment: 2-500 ml boxes daily at least  . Drug use: No  . Sexual activity: Yes    Birth control/protection: Surgical  Lifestyle  . Physical activity:    Days per week: Not on file    Minutes per session: Not on file  . Stress: Not on file  Relationships  . Social connections:    Talks on phone: Not on file    Gets together: Not on file    Attends religious service: Not on file    Active member of club or organization: Not on file    Attends meetings of clubs or organizations: Not on file    Relationship status: Not on file  Other Topics Concern  . Not on file  Social History Narrative  . Not on file   Additional Social History:                         Sleep: Fair  Appetite:  Fair  Current Medications: Current Facility-Administered Medications  Medication Dose Route Frequency Provider Last Rate Last Dose  . acetaminophen (TYLENOL) tablet 650 mg  650 mg Oral Q6H PRN Nira Conn A, NP      . alum & mag hydroxide-simeth (MAALOX/MYLANTA) 200-200-20 MG/5ML suspension 30 mL  30 mL Oral Q4H PRN Nira Conn A, NP      . ARIPiprazole (ABILIFY) tablet 10 mg  10 mg Oral Daily Micheal Likens, MD   10 mg at 01/24/18 0817  . chlordiazePOXIDE (LIBRIUM) capsule 25 mg  25 mg Oral QID PRN Armandina Stammer I, NP   25 mg at 01/23/18 1116  . cloNIDine (CATAPRES) tablet 0.1 mg  0.1 mg Oral TID PC & HS Nira Conn A, NP   0.1 mg at 01/24/18 0818  . DULoxetine (CYMBALTA) DR capsule 20 mg  20 mg Oral Daily Nira Conn A, NP   20 mg at 01/24/18 0818  . etodolac (LODINE) capsule 400 mg  400 mg Oral BID Antonieta Pert, MD   400 mg at 01/24/18 0818  . gabapentin (NEURONTIN) capsule 600 mg  600 mg Oral TID Nira Conn A, NP   600 mg at 01/24/18 0819  . hydrOXYzine (ATARAX/VISTARIL) tablet 50 mg  50 mg Oral Q6H PRN Nira Conn A, NP   50 mg at 01/22/18 1643  . levothyroxine  (SYNTHROID, LEVOTHROID) tablet 25 mcg  25 mcg Oral QAC breakfast Nira Conn A, NP   25 mcg at 01/24/18 [phone]  . loperamide (IMODIUM) capsule 2-4 mg  2-4 mg Oral PRN Nira Conn A, NP      . magnesium hydroxide (MILK OF MAGNESIA) suspension 30 mL  30 mL Oral Daily PRN Jackelyn Poling, NP  30 mL at 01/22/18 1202  . methadone (DOLOPHINE) tablet 170 mg  170 mg Oral Daily Antonieta Pert, MD   170 mg at 01/24/18 1610  . multivitamin with minerals tablet 1 tablet  1 tablet Oral Daily Nira Conn A, NP   1 tablet at 01/24/18 0818  . nicotine (NICODERM CQ - dosed in mg/24 hours) patch 21 mg  21 mg Transdermal Daily Antonieta Pert, MD   21 mg at 01/24/18 0825  . ondansetron (ZOFRAN-ODT) disintegrating tablet 4 mg  4 mg Oral Q6H PRN Nira Conn A, NP      . thiamine (VITAMIN B-1) tablet 100 mg  100 mg Oral Daily Nira Conn A, NP   100 mg at 01/24/18 0818  . traZODone (DESYREL) tablet 100 mg  100 mg Oral QHS Nira Conn A, NP   100 mg at 01/23/18 2153    Lab Results:  No results found for this or any previous visit (from the past 48 hour(s)).  Blood Alcohol level:  Lab Results  Component Value Date   ETH <10 01/21/2018   ETH 152 (H) 09/12/2017    Metabolic Disorder Labs: Lab Results  Component Value Date   HGBA1C 4.6 (L) 01/22/2018   MPG 85.32 01/22/2018   MPG 91.06 09/14/2017   Lab Results  Component Value Date   PROLACTIN 20.2 01/22/2018   Lab Results  Component Value Date   CHOL 222 (H) 01/22/2018   TRIG 77 01/22/2018   HDL 119 01/22/2018   CHOLHDL 1.9 01/22/2018   VLDL 15 01/22/2018   LDLCALC 88 01/22/2018   LDLCALC 105 (H) 09/14/2017    Physical Findings: AIMS: Facial and Oral Movements Muscles of Facial Expression: None, normal Lips and Perioral Area: None, normal Jaw: None, normal Tongue: None, normal,Extremity Movements Upper (arms, wrists, hands, fingers): None, normal Lower (legs, knees, ankles, toes): None, normal, Trunk Movements Neck, shoulders,  hips: None, normal, Overall Severity Severity of abnormal movements (highest score from questions above): None, normal Incapacitation due to abnormal movements: None, normal Patient's awareness of abnormal movements (rate only patient's report): No Awareness, Dental Status Current problems with teeth and/or dentures?: No Does patient usually wear dentures?: Yes  CIWA:  CIWA-Ar Total: 3 COWS:     Musculoskeletal: Strength & Muscle Tone: within normal limits Gait & Station: normal Patient leans: N/A  Psychiatric Specialty Exam: Physical Exam  Nursing note and vitals reviewed. Constitutional: She is oriented to person, place, and time. She appears well-developed.  Neurological: She is alert and oriented to person, place, and time.  Psychiatric: She has a normal mood and affect. Her behavior is normal.    Review of Systems  Psychiatric/Behavioral: Positive for depression and substance abuse. Negative for suicidal ideas. The patient is nervous/anxious. The patient does not have insomnia.   All other systems reviewed and are negative.   Blood pressure (!) 134/112, pulse 68, temperature 98.6 F (37 C), temperature source Oral, resp. rate 18, height 5\' 2"  (1.575 m), weight 64.9 kg (143 lb).Body mass index is 26.16 kg/m.  General Appearance: Casual and Guarded but pleasant.  Eye Contact:  Fair  Speech:  Clear and Coherent  Volume:  Normal  Mood:  Anxious and Depressed  Affect:  Depressed and Flat-slight improvement  Thought Process:  Coherent  Orientation:  Full (Time, Place, and Person)  Thought Content:  Hallucinations: None and Rumination  Suicidal Thoughts:  Yes.  with intent/plan  Homicidal Thoughts:  No  Memory:  Immediate;   Fair Recent;  Fair Remote;   Fair  Judgement:  Fair  Insight:  Fair  Psychomotor Activity:  Normal  Concentration:  Concentration: Fair  Recall:  [employer] of Knowledge:  Fair  Language:  Fair  Akathisia:  No  Handed:  Right  AIMS (if  indicated):     Assets:  Communication Skills Desire for Improvement Resilience Social Support Talents/Skills  ADL's:  Intact  Cognition:  WNL  Sleep:  Number of Hours: 6.5     Treatment Plan Summary: Daily contact with patient to assess and evaluate symptoms and progress in treatment and Medication management  Continue with current treatment plan on 01/24/2018 as listed below except where noted   Substance abuse:  Continue Librium 25 mg PRN for anxiety and Etoh withdraws  Continue methadone 170 mg PO QD   Mood stablization  Continue with Neurontin 600 mg  Continue Cymbalta 20 mg    Continue  Abilify 10 mg   Insomnia                  Continue with Trazodone 100 mg PO  QHS   Will continue to monitor vitals ,medication compliance and treatment side effects while patient is here.   CSW will continue  working on disposition.  Patient to participate in therapeutic milieu   Oneta Rack, NP 01/24/2018, 11:31 AM

## 2018-01-24 NOTE — [facility] Group Notes (Signed)
[facility] LCSW Group Therapy Note  01/24/2018  10:00-11:00AM  Type of Therapy and Topic:  Group Therapy:  Being Your Own Support  Participation Level:  Active   Description of Group:  Patients in this group were introduced to the concept that self-support is an essential part of recovery.  A song entitled "My Own Hero" was played and a group discussion ensued in which patients stated they could relate to the song and it inspired them to realize they have be willing to help themselves in order to succeed, because other people cannot achieve sobriety or stability for them.  We discussed adding a variety of healthy supports to address the various needs in their lives.  A song was played called "I Know Where I've Been" toward the end of group and used to conduct an inspirational wrap-up to group of remembering how far they have already come in their journey.  Therapeutic Goals: 1)  demonstrate the importance of being a part of one's own support system 2)  discuss reasons people in one's life may eventually be unable to be continually supportive  3)  identify the patient's current support system and   4)  elicit commitments to add healthy supports and to become more conscious of being self-supportive   Summary of Patient Progress:  The patient expressed that even though he is not always successful about it, her ex-husband does try to help her.  She also feels her mother-in-law is supportive all the time; however, she said her own family is lacking any support for her.  Later as other patients spoke, she stated that they had made her realize her son is a healthy support for her.  She was at times focused on the past mistakes she has made, and throughout group, attempts were made to help her look instead at how far she has come since that.     Therapeutic Modalities:   Motivational Interviewing Activity  Lynnell Chad

## 2018-01-24 NOTE — Progress Notes (Signed)
D. Pt presents with a depressed affect and mood- observed in milieu interacting appropriately with peers. Pt continues to endorse mild withdrawal symptoms (cramping, tremors) and joint pain 5/10, relieved by pain meds. Per pt's self inventory, pt rates her depression, hopelessness and anxiety a 7/7/9, respectively. Pt continues to endorse passive SI (having suicidal thoughts sometimes- no plan). Patient is able to verbally contract for safety. Pt writes that her most important goal to work on today is "suicidal thoughts" and "try to be positive".  A. Labs and vitals monitored. Pt compliant with medications. Pt supported emotionally and encouraged to express concerns and ask questions.   R. Pt remains safe with 15 minute checks. Will continue POC.

## 2018-01-24 NOTE — Progress Notes (Signed)
Patient did attend the evening speaker AA meeting but was sleeping through most of the meeting.

## 2018-01-25 DIAGNOSIS — R251 Tremor, unspecified: Secondary | ICD-10-CM

## 2018-01-25 DIAGNOSIS — R454 Irritability and anger: Secondary | ICD-10-CM

## 2018-01-25 MED ORDER — DULOXETINE HCL 20 MG PO CPEP
40.0000 mg | ORAL_CAPSULE | Freq: Every day | ORAL | Status: DC
  Administered 2018-01-26 – 2018-01-27 (×2): 40 mg via ORAL
  Filled 2018-01-25 (×4): qty 2

## 2018-01-25 MED ORDER — ARIPIPRAZOLE ER 400 MG IM SRER
400.0000 mg | INTRAMUSCULAR | Status: DC
  Administered 2018-01-25: 400 mg via INTRAMUSCULAR

## 2018-01-25 NOTE — [facility] Group Notes (Signed)
Adult Psychoeducational Group Note  Date:  01/25/2018 Time:  10:39 PM  Group Topic/Focus:  Wrap-Up Group:   The focus of this group is to help patients review their daily goal of treatment and discuss progress on daily workbooks.  Participation Level:  Active  Participation Quality:  Appropriate and Attentive  Affect:  Appropriate  Cognitive:  Alert and Appropriate  Insight: Appropriate and Good  Engagement in Group:  Engaged  Modes of Intervention:  Discussion and Education  Additional Comments:  Pt attended and participated in wrap up group this evening. Pt had a good day, due to them being able to go back to a previous treatment center that they were kicked out of. Pt completed their goal of working on not having thoughts, which they are not having as much.   Chrisandra Netters 01/25/2018, 10:39 PM

## 2018-01-25 NOTE — Progress Notes (Addendum)
[facility] MD Progress Note  01/25/2018 1:52 PM Carla Park  MRN:  [id number]  Subjective:  Carla Park is awake alert and oriented x3.  Patient was evaluated  by attending psychiatrist and NP.  Continues to ruminate with feelings of guilt and depression.  Patient reports she is going to utilize this inpatient admission to work on herself.  Reports intermittent thoughts of suicidal ideation.  However is able to contract for safety while on the unit.  Reports taking medications as prescribed and tolerating them well.  Discussed initiating monthly injection of Abilify Maintena and will increase Cymbalta 20 mg to 40 mg, due to ongoing mood irritability/tremors.  Patient was agreeable to treatment plan.  Reports she seeking inpatient treatment rehabilitation treatment program, located in [place].  Patient to follow-up with social worker for ongoing discharge disposition.  Denies auditory or visual hallucinations.  Reports a good appetite.  Support encouragement reassurance was provided.  History: per assessment note-37 year old Caucasian female. She is  known to our unit from prior admissions all related to substance use disorder. She presented to ED voluntarily yesterday with husband with complaints of worsening suicidal ideations of 2 days withy plans to cut her wrist after relapsing on alcohol by drinking mouth wash. She has been living in a sober living environment prior to this. Patient reports being on methadone maintenance at the [facility].   Principal Problem: MDD (major depressive disorder), recurrent severe, without psychosis (HCC) Diagnosis:   Patient Active Problem List   Diagnosis Date Noted  . Alcohol use disorder, severe, dependence (HCC) [F10.20] 01/22/2018  . MDD (major depressive disorder) [F32.9] 09/12/2017  . Cervical radiculopathy at C6 [M54.12] 05/19/2017  . Hepatitis C infection [B19.20] 05/19/2017  . Multiple joint pain [M25.50] 02/06/2017  . Verbal  fluency disorder [R47.89] 02/06/2017  . Memory loss [R41.3] 02/06/2017  . Family history of MS (multiple sclerosis) [Z82.0] 02/06/2017  . Alcohol use disorder, moderate, dependence (HCC) [F10.20] 01/28/2017  . MDD (major depressive disorder), recurrent severe, without psychosis (HCC) [F33.2] 01/14/2017  . 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 Alcohol withdrawal (HCC) [F10.239] 05/29/2015  . Chronic pain [G89.29]   . Fibromyalgia [M79.7]   . 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 Migraines [G43.909] 11/18/2012  . RA (rheumatoid arthritis) (HCC) [M06.9] 11/18/2012   Total Time spent with patient: 15 minutes  Past Psychiatric History:   Past Medical History:  Past Medical History:  Diagnosis Date  . Arthritis   . Chronic pain   . Depression   . ETOH abuse   . Fibromyalgia   . Hep C w/ coma, chronic (HCC)   . Hepatitis C   . Opiate abuse, continuous (HCC)    on Methadone currently  . Suicide attempt (HCC)    cut wrist  . Vision abnormalities     Past Surgical History:  Procedure Laterality Date  . ABDOMINAL HYSTERECTOMY    . APPENDECTOMY    . CESAREAN SECTION    . CHOLECYSTECTOMY    . KNEE SURGERY     Family History:  Family History  Problem Relation Age of Onset  . Multiple sclerosis Mother   . Hypertension Father    Family Psychiatric  History: Social History:  Social History   Substance and Sexual Activity  Alcohol Use Yes   Comment: 2-500 ml boxes daily at least     Social History   Substance and Sexual Activity  Drug Use No  Social History   Socioeconomic History  . Marital status: Married    Spouse name: Not on file  . Number of children: Not on file  . Years of education: Not on file  . Highest education level: Not on file  Occupational History  . Not on file  Social Needs  . Financial resource strain: Not on file  . Food insecurity:    Worry: Not on file    Inability: Not on file  .  Transportation needs:    Medical: Not on file    Non-medical: Not on file  Tobacco Use  . Smoking status: Current Every Day Smoker    Packs/day: 1.00    Types: Cigarettes  . Smokeless tobacco: Never Used  Substance and Sexual Activity  . Alcohol use: Yes    Comment: 2-500 ml boxes daily at least  . Drug use: No  . Sexual activity: Yes    Birth control/protection: Surgical  Lifestyle  . Physical activity:    Days per week: Not on file    Minutes per session: Not on file  . Stress: Not on file  Relationships  . Social connections:    Talks on phone: Not on file    Gets together: Not on file    Attends religious service: Not on file    Active member of club or organization: Not on file    Attends meetings of clubs or organizations: Not on file    Relationship status: Not on file  Other Topics Concern  . Not on file  Social History Narrative  . Not on file   Additional Social History:                         Sleep: Fair  Appetite:  Fair  Current Medications: Current Facility-Administered Medications  Medication Dose Route Frequency Provider Last Rate Last Dose  . acetaminophen (TYLENOL) tablet 650 mg  650 mg Oral Q6H PRN Nira Conn A, NP      . alum & mag hydroxide-simeth (MAALOX/MYLANTA) 200-200-20 MG/5ML suspension 30 mL  30 mL Oral Q4H PRN Nira Conn A, NP      . ARIPiprazole (ABILIFY) tablet 10 mg  10 mg Oral Daily Micheal Likens, MD   10 mg at 01/25/18 6222  . ARIPiprazole ER (ABILIFY MAINTENA) injection 400 mg  400 mg Intramuscular Q28 days Jolyne Loa T, MD      . chlordiazePOXIDE (LIBRIUM) capsule 25 mg  25 mg Oral QID PRN Armandina Stammer I, NP   25 mg at 01/25/18 1316  . cloNIDine (CATAPRES) tablet 0.1 mg  0.1 mg Oral TID PC & HS Nira Conn A, NP   0.1 mg at 01/25/18 1201  . [START ON 01/26/2018] DULoxetine (CYMBALTA) DR capsule 40 mg  40 mg Oral Daily Micheal Likens, MD      . etodolac (LODINE) capsule 400 mg  400 mg  Oral BID Antonieta Pert, MD   400 mg at 01/25/18 9798  . gabapentin (NEURONTIN) capsule 600 mg  600 mg Oral TID Nira Conn A, NP   600 mg at 01/25/18 1201  . hydrOXYzine (ATARAX/VISTARIL) tablet 50 mg  50 mg Oral Q6H PRN Nira Conn A, NP   50 mg at 01/22/18 1643  . levothyroxine (SYNTHROID, LEVOTHROID) tablet 25 mcg  25 mcg Oral QAC breakfast Nira Conn A, NP   25 mcg at 01/25/18 0618  . magnesium hydroxide (MILK OF MAGNESIA) suspension 30 mL  30  mL Oral Daily PRN Nira Conn A, NP   30 mL at 01/22/18 1202  . methadone (DOLOPHINE) tablet 170 mg  170 mg Oral Daily Antonieta Pert, MD   170 mg at 01/25/18 1478  . multivitamin with minerals tablet 1 tablet  1 tablet Oral Daily Nira Conn A, NP   1 tablet at 01/25/18 2956  . nicotine (NICODERM CQ - dosed in mg/24 hours) patch 21 mg  21 mg Transdermal Daily Antonieta Pert, MD   21 mg at 01/25/18 2130  . thiamine (VITAMIN B-1) tablet 100 mg  100 mg Oral Daily Nira Conn A, NP   100 mg at 01/25/18 8657  . traZODone (DESYREL) tablet 100 mg  100 mg Oral QHS Nira Conn A, NP   100 mg at 01/24/18 2156    Lab Results:  No results found for this or any previous visit (from the past 48 hour(s)).  Blood Alcohol level:  Lab Results  Component Value Date   ETH <10 01/21/2018   ETH 152 (H) 09/12/2017    Metabolic Disorder Labs: Lab Results  Component Value Date   HGBA1C 4.6 (L) 01/22/2018   MPG 85.32 01/22/2018   MPG 91.06 09/14/2017   Lab Results  Component Value Date   PROLACTIN 20.2 01/22/2018   Lab Results  Component Value Date   CHOL 222 (H) 01/22/2018   TRIG 77 01/22/2018   HDL 119 01/22/2018   CHOLHDL 1.9 01/22/2018   VLDL 15 01/22/2018   LDLCALC 88 01/22/2018   LDLCALC 105 (H) 09/14/2017    Physical Findings: AIMS: Facial and Oral Movements Muscles of Facial Expression: None, normal Lips and Perioral Area: None, normal Jaw: None, normal Tongue: None, normal,Extremity Movements Upper (arms, wrists,  hands, fingers): None, normal Lower (legs, knees, ankles, toes): None, normal, Trunk Movements Neck, shoulders, hips: None, normal, Overall Severity Severity of abnormal movements (highest score from questions above): None, normal Incapacitation due to abnormal movements: None, normal Patient's awareness of abnormal movements (rate only patient's report): No Awareness, Dental Status Current problems with teeth and/or dentures?: No Does patient usually wear dentures?: Yes  CIWA:  CIWA-Ar Total: 1 COWS:     Musculoskeletal: Strength & Muscle Tone: within normal limits Gait & Station: normal Patient leans: N/A  Psychiatric Specialty Exam: Physical Exam  Nursing note and vitals reviewed. Constitutional: She is oriented to person, place, and time. She appears well-developed.  Neurological: She is alert and oriented to person, place, and time.  Psychiatric: She has a normal mood and affect. Her behavior is normal.    Review of Systems  Psychiatric/Behavioral: Positive for depression and substance abuse. Negative for suicidal ideas. The patient is nervous/anxious. The patient does not have insomnia.   All other systems reviewed and are negative.   Blood pressure 116/83, pulse 73, temperature (!) 97.5 F (36.4 C), temperature source Oral, resp. rate 16, height 5\' 2"  (1.575 m), weight 64.9 kg (143 lb).Body mass index is 26.16 kg/m.  General Appearance: Casual and Guarded but pleasant.  Eye Contact:  Fair  Speech:  Clear and Coherent  Volume:  Normal  Mood:  Anxious and Depressed  Affect:  Depressed and Flat-slight improvement  Thought Process:  Coherent  Orientation:  Full (Time, Place, and Person)  Thought Content:  Hallucinations: None and Rumination  Suicidal Thoughts:  Yes.  without intent/plan passive ideations, patient is able to contract for safety   Homicidal Thoughts:  No  Memory:  Immediate;   Fair Recent;   Fair  Remote;   Fair  Judgement:  Fair  Insight:  Fair   Psychomotor Activity:  Normal  Concentration:  Concentration: Fair  Recall:  [employer] of Knowledge:  Fair  Language:  Fair  Akathisia:  No  Handed:  Right  AIMS (if indicated):     Assets:  Communication Skills Desire for Improvement Resilience Social Support Talents/Skills  ADL's:  Intact  Cognition:  WNL  Sleep:  Number of Hours: 6.5     Treatment Plan Summary: Daily contact with patient to assess and evaluate symptoms and progress in treatment and Medication management  Continue with current treatment plan on 01/25/2018 as listed below except where noted   Substance abuse:  Continue Librium 25 mg PRN for anxiety and Etoh withdraws  Continue methadone 170 mg PO QD   Mood stablization  Continue with Neurontin 600 mg  Increased  Cymbalta 20 mg to 40mg  PO QD    Continue  Abilify 10 mg,-initiated Abilify Maintena 400 mg  injection monthly  Insomnia                  Continue with Trazodone 100 mg PO  QHS   Will continue to monitor vitals ,medication compliance and treatment side effects while patient is here.   CSW will continue  working on disposition.  Patient to participate in therapeutic milieu   Oneta Rack, NP 01/25/2018, 1:52 PM   I have reviewed the note by NP, and I am in agreement with the assessment and plan.

## 2018-01-25 NOTE — [facility] Group Notes (Signed)
LCSW Group Therapy Note   01/25/2018 1:15pm   Type of Therapy and Topic:  Group Therapy:  Overcoming Obstacles   Participation Level:  Active   Description of Group:    In this group patients will be encouraged to explore what they see as obstacles to their own wellness and recovery. They will be guided to discuss their thoughts, feelings, and behaviors related to these obstacles. The group will process together ways to cope with barriers, with attention given to specific choices patients can make. Each patient will be challenged to identify changes they are motivated to make in order to overcome their obstacles. This group will be process-oriented, with patients participating in exploration of their own experiences as well as giving and receiving support and challenge from other group members.   Therapeutic Goals: 1. Patient will identify personal and current obstacles as they relate to admission. 2. Patient will identify barriers that currently interfere with their wellness or overcoming obstacles.  3. Patient will identify feelings, thought process and behaviors related to these barriers. 4. Patient will identify two changes they are willing to make to overcome these obstacles:      Summary of Patient Progress   Sherilee was attentive and engaged during today's processing group. Sayuri shared that her biggest obstacle involves getting into treatment from the hospital. During group. Pt found out that she was accepted into [facility]. Pt was happy to be entering treatment and continues to demonstrate improving insight and progress in the group setting.    Therapeutic Modalities:   Cognitive Behavioral Therapy Solution Focused Therapy Motivational Interviewing Relapse Prevention Therapy  Rona Ravens, LCSW 01/25/2018 3:34 PM

## 2018-01-25 NOTE — Plan of Care (Signed)
  Problem: Coping: Goal: Ability to verbalize frustrations and anger appropriately will improve Outcome: Progressing   Problem: Safety: Goal: Periods of time without injury will increase Outcome: Progressing   Problem: Medication: Goal: Compliance with prescribed medication regimen will improve Outcome: Progressing DAR NOTE: Pt present with calm affect and pleasant mood in the unit. Pt has been observed in the dayroom interacting with peers and staff. Pt denies physical pain, took all her meds as scheduled. As per self inventory, pt had a good night sleep, fair appetite, low energy, and poor concentration. Pt rate depression at 7, hopeless ness at 07, and anxiety at 9. Pt's safety ensured with 15 minute and environmental checks. Pt currently denies SI/HI and A/V hallucinations. Pt verbally agrees to seek staff if SI/HI or A/VH occurs and to consult with staff before acting on these thoughts. Will continue POC.

## 2018-01-25 NOTE — Progress Notes (Signed)
D:  Carla Park was in the day room all evening.  She was noted playing a board game with a peer.  She stated that her day was better today.  Her husband visited again this evening and that went fairly well.  She reported passive SI earlier today but denied any this evening.  She denied HI or A/V hallucinations.  She was able to sit calmly in the day room.  No tremors noted.  She did report that she was sleepy this evening.  She took her HS medications without difficulty.  She attended evening group.  She interacted well with staff and peers. A:  1:1 with RN for support and encouragement.  Medications as ordered.  Q 15 minute checks maintained for safety.  Encouraged participation in group and unit activities.   R:  Carla Park remains safe on the unit.  We will continue to monitor the progress towards her goals.

## 2018-01-25 NOTE — Progress Notes (Signed)
Pt is observed in the dayroom, attending wrap-up group. Pt appears anxious in affect and mood. Pt denies SI/HI/AVH/Pain at this time. Pt states she will go back to [facility] once discharge. Pt states she does not plan on going to [facility]. PRN librium requested and given. Will continue with POC.

## 2018-01-25 NOTE — Progress Notes (Signed)
Recreation Therapy Notes  Date:  6.10.19  Time: 0930 Location: [address] Dayroom  Group Topic: Stress Management  Goal Area(s) Addresses:  Patient will verbalize importance of using healthy stress management.  Patient will identify positive emotions associated with healthy stress management.   Behavioral Response: Engaged  Intervention: Stress Management  Activity :  Guided Imagery.  LRT introduced patients to the stress management technique of guided imagery.  LRT read a script that allowed patients to take a mental vacation to the beach.  Patients were to follow along as script was read to engage in the activity.  Education:  Stress Management, Discharge Planning.   Education Outcome: Acknowledges edcuation/In group clarification offered/Needs additional education  Clinical Observations/Feedback: Pt attended and participated in group.     Caroll Rancher, LRT/CTRS         Lillia Abed, Kaylin Schellenberg A 01/25/2018 11:05 AM

## 2018-01-25 NOTE — [facility] Group Notes (Signed)
Group opened with brief discussion and psycho-social ed around grief and loss in relationships and in relation to self - identifying life patterns, circumstances, changes connected to losses. Established group norms and goals.  Group goal of establishing open and affirming space for members to process loss and experience with grief, normalize grief experience and provide psycho social education and grief support.. Group members engaged in facilitated dialog and support.  Engaged with Worden's four tasks of grief, identifying how these tasks show in their own experiences.   

## 2018-01-26 DIAGNOSIS — F1010 Alcohol abuse, uncomplicated: Secondary | ICD-10-CM

## 2018-01-26 NOTE — Plan of Care (Signed)
Problem: Activity: Goal: Interest or engagement in activities will improve Intervention: Patient has been provided a daily scheduled and is encouraged by staff to attend groups. Outcome: Patient is visible on the unit, interactive with peers/staff and attending groups. 01/26/2018 11:04 AM - Progressing by Ferrel Logan, RN    Problem: Health Behavior/Discharge Planning: Goal: Compliance with treatment plan for underlying cause of condition will improve Intervention: Patient encouraged to take medications as prescribed and attend groups. Patient encouraged to be active in their recovery. Outcome: Patient is attending groups, taking medications as prescribed and complying with goals of treatment. 01/26/2018 11:04 AM - Progressing by Ferrel Logan, RN

## 2018-01-26 NOTE — Progress Notes (Signed)
[facility] MD Progress Note  01/26/2018 2:56 PM Carla Park  MRN:  [id number]  Subjective: Carla Park reports, "I'm doing well. I have been improving by the day. Today, I feel happy & relieved. I got  Some good news yesterday. The sober house where I was residing prior to my admission here has agreed to allow me to come back there after discharge. That has helped my mood tremendously. I really feel great. My husband called them & spoke on my behalf".  History: per assessment note-37 year old Caucasian female. She is  known to our unit from prior admissions all related to substance use disorder. She presented to ED voluntarily yesterday with husband with complaints of worsening suicidal ideations of 2 days withy plans to cut her wrist after relapsing on alcohol by drinking mouth wash. She has been living in a sober living environment prior to this. Patient reports being on methadone maintenance at the [facility].   [name] is seen, chart reviewed. The chart findings dicussed with the treatment team. She presents awake, alert and oriented x 3. She reports improved mood & hopeful for the future. She says she got the good news yesterday that changed everything for her. She says the half-way house where she was residing prior to her relapse has agreed to allow her to return there after discharge. She says  her husband called this half-way house & pleaded on her behalf. She denies any new issues today. She is attending & participating in the group sessions. Patient has agreed to continue current treatment plan already in progress. Denies any auditory or visual hallucinations, SIHI.  Reports a good appetite. Support & encouragement were provided.  Principal Problem: MDD (major depressive disorder), recurrent severe, without psychosis (HCC) Diagnosis:   Patient Active Problem List   Diagnosis Date Noted  . Alcohol use disorder, moderate, dependence (HCC) [F10.20] 01/28/2017    Priority: High  . Alcohol  use disorder, severe, dependence (HCC) [F10.20] 01/22/2018  . MDD (major depressive disorder) [F32.9] 09/12/2017  . Cervical radiculopathy at C6 [M54.12] 05/19/2017  . Hepatitis C infection [B19.20] 05/19/2017  . Multiple joint pain [M25.50] 02/06/2017  . 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 Memory loss [R41.3] 02/06/2017  . Family history of MS (multiple sclerosis) [Z82.0] 02/06/2017  . MDD (major depressive disorder), recurrent severe, without psychosis (HCC) [F33.2] 01/14/2017  . 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 Alcohol withdrawal (HCC) [F10.239] 05/29/2015  . Chronic pain [G89.29]   . Fibromyalgia [M79.7]   . 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 Migraines [G43.909] 11/18/2012  . RA (rheumatoid arthritis) (HCC) [M06.9] 11/18/2012   Total Time spent with patient: 15 minutes  Past Psychiatric History: See H&P.  Past Medical History:  Past Medical History:  Diagnosis Date  . Arthritis   . Chronic pain   . Depression   . ETOH abuse   . Fibromyalgia   . Hep C w/ coma, chronic (HCC)   . Hepatitis C   . Opiate abuse, continuous (HCC)    on Methadone currently  . Suicide attempt (HCC)    cut wrist  . Vision abnormalities     Past Surgical History:  Procedure Laterality Date  . ABDOMINAL HYSTERECTOMY    . APPENDECTOMY    . CESAREAN SECTION    . CHOLECYSTECTOMY    . KNEE SURGERY     Family History:  Family History  Problem Relation Age of Onset  . Multiple  sclerosis Mother   . Hypertension Father    Family Psychiatric  History: See H&P. Social History:  Social History   Substance and Sexual Activity  Alcohol Use Yes   Comment: 2-500 ml boxes daily at least     Social History   Substance and Sexual Activity  Drug Use No    Social History   Socioeconomic History  . Marital status: Married    Spouse name: Not on file  . Number of children: Not on file  . Years of education: Not on file  .  Highest education level: Not on file  Occupational History  . Not on file  Social Needs  . Financial resource strain: Not on file  . Food insecurity:    Worry: Not on file    Inability: Not on file  . Transportation needs:    Medical: Not on file    Non-medical: Not on file  Tobacco Use  . Smoking status: Current Every Day Smoker    Packs/day: 1.00    Types: Cigarettes  . Smokeless tobacco: Never Used  Substance and Sexual Activity  . Alcohol use: Yes    Comment: 2-500 ml boxes daily at least  . Drug use: No  . Sexual activity: Yes    Birth control/protection: Surgical  Lifestyle  . Physical activity:    Days per week: Not on file    Minutes per session: Not on file  . Stress: Not on file  Relationships  . Social connections:    Talks on phone: Not on file    Gets together: Not on file    Attends religious service: Not on file    Active member of club or organization: Not on file    Attends meetings of clubs or organizations: Not on file    Relationship status: Not on file  Other Topics Concern  . Not on file  Social History Narrative  . Not on file   Additional Social History:   Sleep: Good  Appetite:  Good  Current Medications: Current Facility-Administered Medications  Medication Dose Route Frequency Provider Last Rate Last Dose  . acetaminophen (TYLENOL) tablet 650 mg  650 mg Oral Q6H PRN Nira Conn A, NP      . alum & mag hydroxide-simeth (MAALOX/MYLANTA) 200-200-20 MG/5ML suspension 30 mL  30 mL Oral Q4H PRN Nira Conn A, NP      . ARIPiprazole (ABILIFY) tablet 10 mg  10 mg Oral Daily Micheal Likens, MD   10 mg at 01/26/18 0756  . ARIPiprazole ER (ABILIFY MAINTENA) injection 400 mg  400 mg Intramuscular Q28 days Micheal Likens, MD   400 mg at 01/25/18 1709  . chlordiazePOXIDE (LIBRIUM) capsule 25 mg  25 mg Oral QID PRN Armandina Stammer I, NP   25 mg at 01/26/18 0959  . cloNIDine (CATAPRES) tablet 0.1 mg  0.1 mg Oral TID PC & HS Nira Conn A, NP   0.1 mg at 01/26/18 1302  . DULoxetine (CYMBALTA) DR capsule 40 mg  40 mg Oral Daily Micheal Likens, MD   40 mg at 01/26/18 0756  . etodolac (LODINE) capsule 400 mg  400 mg Oral BID Antonieta Pert, MD   400 mg at 01/26/18 0756  . gabapentin (NEURONTIN) capsule 600 mg  600 mg Oral TID Nira Conn A, NP   600 mg at 01/26/18 1300  . hydrOXYzine (ATARAX/VISTARIL) tablet 50 mg  50 mg Oral Q6H PRN Jackelyn Poling, NP   50 mg  at 01/22/18 1643  . levothyroxine (SYNTHROID, LEVOTHROID) tablet 25 mcg  25 mcg Oral QAC breakfast Nira Conn A, NP   25 mcg at 01/26/18 0615  . magnesium hydroxide (MILK OF MAGNESIA) suspension 30 mL  30 mL Oral Daily PRN Nira Conn A, NP   30 mL at 01/22/18 1202  . methadone (DOLOPHINE) tablet 170 mg  170 mg Oral Daily Antonieta Pert, MD   170 mg at 01/26/18 0756  . multivitamin with minerals tablet 1 tablet  1 tablet Oral Daily Nira Conn A, NP   1 tablet at 01/26/18 0756  . nicotine (NICODERM CQ - dosed in mg/24 hours) patch 21 mg  21 mg Transdermal Daily Antonieta Pert, MD   21 mg at 01/26/18 0806  . thiamine (VITAMIN B-1) tablet 100 mg  100 mg Oral Daily Nira Conn A, NP   100 mg at 01/26/18 0756  . traZODone (DESYREL) tablet 100 mg  100 mg Oral QHS Nira Conn A, NP   100 mg at 01/25/18 2136   Lab Results:  No results found for this or any previous visit (from the past 48 hour(s)).  Blood Alcohol level:  Lab Results  Component Value Date   ETH <10 01/21/2018   ETH 152 (H) 09/12/2017   Metabolic Disorder Labs: Lab Results  Component Value Date   HGBA1C 4.6 (L) 01/22/2018   MPG 85.32 01/22/2018   MPG 91.06 09/14/2017   Lab Results  Component Value Date   PROLACTIN 20.2 01/22/2018   Lab Results  Component Value Date   CHOL 222 (H) 01/22/2018   TRIG 77 01/22/2018   HDL 119 01/22/2018   CHOLHDL 1.9 01/22/2018   VLDL 15 01/22/2018   LDLCALC 88 01/22/2018   LDLCALC 105 (H) 09/14/2017   Physical Findings: AIMS:  Facial and Oral Movements Muscles of Facial Expression: None, normal Lips and Perioral Area: None, normal Jaw: None, normal Tongue: None, normal,Extremity Movements Upper (arms, wrists, hands, fingers): Minimal Lower (legs, knees, ankles, toes): None, normal, Trunk Movements Neck, shoulders, hips: None, normal, Overall Severity Severity of abnormal movements (highest score from questions above): Minimal Incapacitation due to abnormal movements: None, normal Patient's awareness of abnormal movements (rate only patient's report): Aware, no distress, Dental Status Current problems with teeth and/or dentures?: No Does patient usually wear dentures?: Yes  CIWA:  CIWA-Ar Total: 3 COWS:     Musculoskeletal: Strength & Muscle Tone: within normal limits Gait & Station: normal Patient leans: N/A  Psychiatric Specialty Exam: Physical Exam  Nursing note and vitals reviewed. Constitutional: She is oriented to person, place, and time. She appears well-developed.  Neurological: She is alert and oriented to person, place, and time.  Psychiatric: She has a normal mood and affect. Her behavior is normal.    Review of Systems  Psychiatric/Behavioral: Positive for depression and substance abuse. Negative for suicidal ideas. The patient is nervous/anxious. The patient does not have insomnia.   All other systems reviewed and are negative.   Blood pressure (!) 142/98, pulse 85, temperature 97.7 F (36.5 C), temperature source Oral, resp. rate 16, height 5\' 2"  (1.575 m), weight 64.9 kg (143 lb).Body mass index is 26.16 kg/m.  General Appearance: Casual and Guarded but pleasant.  Eye Contact:  Fair  Speech:  Clear and Coherent  Volume:  Normal  Mood:  Anxious and Depressed  Affect:  Depressed and Flat-slight improvement  Thought Process:  Coherent  Orientation:  Full (Time, Place, and Person)  Thought Content:  Hallucinations: None  and Rumination  Suicidal Thoughts:  Yes.  without intent/plan  passive ideations, patient is able to contract for safety   Homicidal Thoughts:  No  Memory:  Immediate;   Fair Recent;   Fair Remote;   Fair  Judgement:  Fair  Insight:  Fair  Psychomotor Activity:  Normal  Concentration:  Concentration: Fair  Recall:  [employer] of Knowledge:  Fair  Language:  Fair  Akathisia:  No  Handed:  Right  AIMS (if indicated):     Assets:  Communication Skills Desire for Improvement Resilience Social Support Talents/Skills  ADL's:  Intact  Cognition:  WNL  Sleep:  Number of Hours: 4.75   Treatment Plan Summary: Daily contact with patient to assess and evaluate symptoms and progress in treatment and Medication management.  -Continue inpatient hospitalization.  -Will continue today 01/26/2018 plan as below except where it is noted.  Substance withdraw: Completed.  -Discontinued Librium 25 mg PRN for anxiety and Etoh withdraws  -Continue methadone 170 mg PO QD   Mood stablization             -Continue with Neurontin 600 mg  -Continue Cymbalta 20 mg to 40mg  po daily   -Continue  Abilify 10 mg po daily.              - 1st dose of Abilify Maintena 400 mg IN monthly injection administered on 01-25-18, this will continue Q 28 days.  Insomnia                  Continue with Trazodone 100 mg PO  QHS   Will continue to monitor vitals ,medication compliance and treatment side effects while patient is here.   CSW will continue  working on disposition.   Patient to participate in therapeutic milieu  Armandina Stammer, NP, PMHNP, FNP-BC. 01/26/2018, 2:56 PM  Patient ID: Carla Park, female   DOB: 1981/02/01, 37 y.o.   MRN: [id number]

## 2018-01-26 NOTE — Progress Notes (Signed)
Pt is observed in the dayroom, attending AA. Pt appears anxious in affect and mood. Pt denies SI/HI/AVH/Pain at this time. Pt states she hopes to be discharge soon. Pt states she is ready to return back to [facility]. PRN vistaril requested and given. Will continue with POC.

## 2018-01-26 NOTE — Progress Notes (Signed)
Patient ID: Carla Park, female   DOB: 1980/10/14, 37 y.o.   MRN: [id number]  Nursing Progress Note [id number]  Data: Patient presents with anxious mood but is pleasant and appropriate in the milieu. Patient complaint with scheduled medications and requests PRN Librium for anxiety this morning. Patient is noted to have mild tremors. Patient denies pain/physical complaints. Patient completed self-inventory sheet and rates depression, hopelessness, and anxiety 5,4,8 respectively. Patient rates their sleep and appetite as good/fair respectively. Patient states goal for today is to "work on discharge plans" and "make calls".  Patient is seen attending groups and visible in the milieu. Patient currently denies SI/HI/AVH. Patient reports she is excited to go back to [facility] stating, "those people really love me. I feel very supported. I know what I need to do, now I just have to do it".  Action: Patient educated about and provided medication per provider's orders. Patient safety maintained with q15 min safety checks and frequent rounding. Low fall risk precautions in place. Emotional support given. 1:1 interaction and active listening provided. Patient encouraged to attend meals and groups. Patient encouraged to work on treatment plan and goals. Labs, vital signs and patient behavior monitored throughout shift.   Response: Patient agrees to come to staff if any thoughts of SI/HI develop or if patient develops intention of acting on thoughts. Patient remains safe on the unit at this time. Patient is interacting with peers appropriately on the unit. Will continue to support and monitor.

## 2018-01-27 MED ORDER — ARIPIPRAZOLE ER 400 MG IM SRER: 400.0000 mg | INTRAMUSCULAR | 0 refills | Status: DC

## 2018-01-27 MED ORDER — ETODOLAC 200 MG PO CAPS: 400.0000 mg | ORAL_CAPSULE | Freq: Two times a day (BID) | ORAL | 0 refills | Status: AC

## 2018-01-27 MED ORDER — LEVOTHYROXINE SODIUM 25 MCG PO TABS: 25.0000 ug | ORAL_TABLET | Freq: Every day | ORAL | Status: AC

## 2018-01-27 MED ORDER — ADULT MULTIVITAMIN W/MINERALS CH: 1.0000 | ORAL_TABLET | Freq: Every day | ORAL | Status: DC

## 2018-01-27 MED ORDER — DULOXETINE HCL 40 MG PO CPEP: 40.0000 mg | ORAL_CAPSULE | Freq: Every day | ORAL | 0 refills | Status: DC

## 2018-01-27 MED ORDER — NICOTINE 21 MG/24HR TD PT24: 21.0000 mg | MEDICATED_PATCH | Freq: Every day | TRANSDERMAL | 0 refills | Status: DC

## 2018-01-27 MED ORDER — HYDROXYZINE HCL 50 MG PO TABS: 50.0000 mg | ORAL_TABLET | Freq: Four times a day (QID) | ORAL | 0 refills | Status: DC | PRN

## 2018-01-27 MED ORDER — METHADONE HCL 10 MG PO TABS: 170.0000 mg | ORAL_TABLET | Freq: Every day | ORAL | 0 refills | Status: DC

## 2018-01-27 MED ORDER — TRAZODONE HCL 100 MG PO TABS: 100.0000 mg | ORAL_TABLET | Freq: Every day | ORAL | 0 refills | Status: DC

## 2018-01-27 MED ORDER — CLONIDINE HCL 0.1 MG PO TABS: 0.1000 mg | ORAL_TABLET | Freq: Three times a day (TID) | ORAL | 0 refills | Status: DC

## 2018-01-27 MED ORDER — GABAPENTIN 300 MG PO CAPS: 600.0000 mg | ORAL_CAPSULE | Freq: Three times a day (TID) | ORAL | 0 refills | Status: DC

## 2018-01-27 MED ORDER — ARIPIPRAZOLE 10 MG PO TABS: 10.0000 mg | ORAL_TABLET | Freq: Every day | ORAL | 0 refills | Status: DC

## 2018-01-27 NOTE — Progress Notes (Signed)
Recreation Therapy Notes  Date: 6.12.19 Time: 0930 Location: 300 Hall Dayroom  Group Topic: Stress Management  Goal Area(s) Addresses:  Patient will verbalize importance of using healthy stress management.  Patient will identify positive emotions associated with healthy stress management.   Behavioral Response: Engaged  Intervention: Stress Management  Activity :  Meditation.  LRT introduced the stress management technique of meditation.  LRT played a meditation on letting go of the past and focusing on right now.  Patiens were to listen and follow along as meditation played.  Education:  Stress Management, Discharge Planning.   Education Outcome: Acknowledges edcuation/In group clarification offered/Needs additional education  Clinical Observations/Feedback: Pt attended and participated in group.     Caroll Rancher, LRT/CTRS         Caroll Rancher A 01/27/2018 12:22 PM

## 2018-01-27 NOTE — [facility] Suicide Risk Assessment (Signed)
[facility] Discharge Suicide Risk Assessment   Principal Problem: MDD (major depressive disorder), recurrent severe, without psychosis (HCC) Discharge Diagnoses:  Patient Active Problem List   Diagnosis Date Noted  . Alcohol use disorder, severe, dependence (HCC) [F10.20] 01/22/2018  . MDD (major depressive disorder) [F32.9] 09/12/2017  . Cervical radiculopathy at C6 [M54.12] 05/19/2017  . Hepatitis C infection [B19.20] 05/19/2017  . Multiple joint pain [M25.50] 02/06/2017  . 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 Memory loss [R41.3] 02/06/2017  . Family history of MS (multiple sclerosis) [Z82.0] 02/06/2017  . Alcohol use disorder, moderate, dependence (HCC) [F10.20] 01/28/2017  . MDD (major depressive disorder), recurrent severe, without psychosis (HCC) [F33.2] 01/14/2017  . 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 Alcohol withdrawal (HCC) [F10.239] 05/29/2015  . Chronic pain [G89.29]   . Fibromyalgia [M79.7]   . 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 Migraines [G43.909] 11/18/2012  . RA (rheumatoid arthritis) (HCC) [M06.9] 11/18/2012    Total Time spent with patient: 30 minutes  Musculoskeletal: Strength & Muscle Tone: within normal limits Gait & Station: normal Patient leans: N/A  Psychiatric Specialty Exam: Review of Systems  Constitutional: Negative for chills and fever.  Respiratory: Negative for cough and shortness of breath.   Cardiovascular: Negative for chest pain.  Gastrointestinal: Negative for abdominal pain, heartburn, nausea and vomiting.  Psychiatric/Behavioral: Negative for depression, hallucinations and suicidal ideas. The patient is not nervous/anxious and does not have insomnia.     Blood pressure 108/78, pulse (!) 101, temperature 98.6 F (37 C), temperature source Oral, resp. rate 16, height 5\' 2"  (1.575 m), weight 64.9 kg (143 lb).Body mass index is 26.16 kg/m.  General Appearance: Casual and Fairly  Groomed  ::  Good  Speech:  Clear and Coherent and Normal Rate  Volume:  Normal  Mood:  Euthymic  Affect:  Appropriate and Congruent  Thought Process:  Coherent and Goal Directed  Orientation:  Full (Time, Place, and Person)  Thought Content:  Logical  Suicidal Thoughts:  No  Homicidal Thoughts:  No  Memory:  Immediate;   Fair Recent;   Fair Remote;   Fair  Judgement:  Fair  Insight:  Fair  Psychomotor Activity:  Normal  Concentration:  Good  Recall:  Good  Fund of Knowledge:Good  Language: Good  Akathisia:  No  Handed:    AIMS (if indicated):     Assets:  Communication Skills Resilience Social Support  Sleep:  Number of Hours: 6  Cognition: WNL  ADL's:  Intact   Mental Status Per Nursing Assessment::   On Admission:  Suicidal ideation indicated by patient  Demographic Factors:  Low socioeconomic status and Unemployed  Loss Factors: Financial problems/change in socioeconomic status  Historical Factors: Family history of mental illness or substance abuse and Impulsivity  Risk Reduction Factors:   Living with another person, especially a relative, Positive social support, Positive therapeutic relationship and Positive coping skills or problem solving skills  Continued Clinical Symptoms:  Depression:   Comorbid alcohol abuse/dependence Alcohol/Substance Abuse/Dependencies More than one psychiatric diagnosis Unstable or Poor Therapeutic Relationship Previous Psychiatric Diagnoses and Treatments  Cognitive Features That Contribute To Risk:  None    Suicide Risk:  Minimal: No identifiable suicidal ideation.  Patients presenting with no risk factors but with morbid ruminations; may be classified as minimal risk based on the severity of the depressive symptoms  Follow-up Information    [facility] Follow up on 02/02/2018.   Specialty:  Behavioral Health Why:  Hospital follow-up on Tuesday, 6/18 at 8:00AM. Please bring: Photo ID, social security card, and  insurance card to this appt. Thank you.  Contact information: [address] [place] [address] [phone]         Subjective Data:   Carla Park is a 37 y/o F with history of MDD and alcohol use disorder who was admitted voluntarily from [facility] where he was brought in by her husband with worsening depression, SI with plan to cut her wrists, anxiety, and relapse of alcohol use. Pt shares recent stressor of homelessness after being asked to leave her halfway house due to alcohol use. Pt was medically cleared and then transferred to [facility] for additional treatment and evaluation. She had stopped her medications prior to admission, so she was resumed on gabapentin, trazodone, cymbalta, clonidine, and methadone. She was started on trial of abilify and transitioned to long-acting injectable form. She reported improvement of her presenting symptoms during her admission.  Today upon evaluation, pt shares, "I'm pretty good - I got accepted back into my same [facility], which is pretty rare." Pt denies any specific concerns today. She is sleeping well. Her appetite is good. She denies other physical complaints. She denies SI/HI/AH/VH. She is tolerating her medications well, and she denies side effects. She is in agreement to continue her current regimen without changes. She agrees to have outpatient follow up at [facility]. She was able to engage in safety planning including plan to return to [facility] or contact emergency services if she feels unable to maintain her own safety or the safety of others. Pt had no further questions, comments, or concerns.    Plan Of Care/Follow-up recommendations:   -Discharge to outpatient level of care  -MDD, recurrent, severe, without psychosis             -Continue cymbalta DR 20mg  po qDay             -Continue abilify 10mg  po qDay   -Continue abilify Maintena 400mg  IM q28 days (administered on 6/10)  -Anxiety              -Continue gabapentin 600mg  po TID              -Continue vistaril 50mg  po q6h prn anxiety              -Continue clonidine 0.1mg  TID with meals and at bedtime              -hypothyroidism             -Continue synthroid qDay  -opiate dependence              -Continue methadone 170mg  po qDay  -insomnia              -Continue trazodone 100mg  po qhs  Activity:  as tolerated Diet:  normal Tests:  NA Other:  see above for DC plan  , MD 01/27/2018, 10:11 AM

## 2018-01-27 NOTE — Tx Team (Signed)
Interdisciplinary Treatment and Diagnostic Plan Update  01/27/2018 Time of Session: 0830AM Carla Park MRN: [id number]  Principal Diagnosis: MDD, recurrent, severe  Secondary Diagnoses: Principal Problem:   MDD (major depressive disorder), recurrent severe, without psychosis (HCC) Active Problems:   Alcohol use disorder, severe, dependence (HCC)   Current Medications:  Current Facility-Administered Medications  Medication Dose Route Frequency Provider Last Rate Last Dose  . acetaminophen (TYLENOL) tablet 650 mg  650 mg Oral Q6H PRN Nira Conn A, NP      . alum & mag hydroxide-simeth (MAALOX/MYLANTA) 200-200-20 MG/5ML suspension 30 mL  30 mL Oral Q4H PRN Nira Conn A, NP      . ARIPiprazole (ABILIFY) tablet 10 mg  10 mg Oral Daily Micheal Likens, MD   10 mg at 01/27/18 0820  . ARIPiprazole ER (ABILIFY MAINTENA) injection 400 mg  400 mg Intramuscular Q28 days Micheal Likens, MD   400 mg at 01/25/18 1709  . cloNIDine (CATAPRES) tablet 0.1 mg  0.1 mg Oral TID PC & HS Nira Conn A, NP   0.1 mg at 01/27/18 0820  . DULoxetine (CYMBALTA) DR capsule 40 mg  40 mg Oral Daily Micheal Likens, MD   40 mg at 01/27/18 0820  . etodolac (LODINE) capsule 400 mg  400 mg Oral BID Antonieta Pert, MD   400 mg at 01/27/18 0819  . gabapentin (NEURONTIN) capsule 600 mg  600 mg Oral TID Nira Conn A, NP   600 mg at 01/27/18 0820  . hydrOXYzine (ATARAX/VISTARIL) tablet 50 mg  50 mg Oral Q6H PRN Nira Conn A, NP   50 mg at 01/26/18 2144  . levothyroxine (SYNTHROID, LEVOTHROID) tablet 25 mcg  25 mcg Oral QAC breakfast Nira Conn A, NP   25 mcg at 01/27/18 0634  . magnesium hydroxide (MILK OF MAGNESIA) suspension 30 mL  30 mL Oral Daily PRN Nira Conn A, NP   30 mL at 01/22/18 1202  . methadone (DOLOPHINE) tablet 170 mg  170 mg Oral Daily Antonieta Pert, MD   170 mg at 01/27/18 0177  . multivitamin with minerals tablet 1 tablet  1 tablet Oral Daily Nira Conn A,  NP   1 tablet at 01/27/18 0820  . nicotine (NICODERM CQ - dosed in mg/24 hours) patch 21 mg  21 mg Transdermal Daily Antonieta Pert, MD   21 mg at 01/27/18 0817  . thiamine (VITAMIN B-1) tablet 100 mg  100 mg Oral Daily Nira Conn A, NP   100 mg at 01/27/18 0820  . traZODone (DESYREL) tablet 100 mg  100 mg Oral QHS Nira Conn A, NP   100 mg at 01/26/18 2144   PTA Medications: Medications Prior to Admission  Medication Sig Dispense Refill Last Dose  . ARIPiprazole (ABILIFY) 5 MG tablet Take 1 tablet (5 mg total) by mouth daily. For mood control 30 tablet 0 01/20/2018 at Unknown time  . cloNIDine (CATAPRES) 0.1 MG tablet Take 1 tablet (0.1 mg total) by mouth 4 (four) times daily - after meals and at bedtime. Anxiety & high blood pressure 120 tablet 0 01/21/2018 at Unknown time  . DULoxetine (CYMBALTA) 20 MG capsule Take 1 capsule (20 mg total) by mouth daily. For depression 30 capsule 0 01/20/2018 at Unknown time  . etodolac (LODINE) 400 MG tablet Take 400 mg by mouth 2 (two) times daily.  2 01/20/2018 at Unknown time  . gabapentin (NEURONTIN) 300 MG capsule Take 2 capsules (600 mg total) by mouth 3 (three)  times daily. For agitation 180 capsule 0 01/21/2018 at Unknown time  . HUMIRA PEN 40 MG/0.4ML PNKT Inject 0.4 mLs into the muscle every 14 (fourteen) days.   01/20/2018 at Unknown time  . hydrOXYzine (ATARAX/VISTARIL) 50 MG tablet Take 1 tablet (50 mg) by mouth every 6 hours as needed: For anxiety 120 tablet 0 prn at unk  . levothyroxine (SYNTHROID, LEVOTHROID) 25 MCG tablet Take 1 tablet (25 mcg total) by mouth daily before breakfast. For low thyroid function 30 tablet 0 01/21/2018 at 0700  . methadone (DOLOPHINE) 10 MG/ML solution Take 16 mLs (160 mg total) by mouth daily. For opioid addiction (Patient taking differently: Take 170 mg by mouth daily. For opioid addiction)  0 01/21/2018 at 0800  . Multiple Vitamin (MULTIVITAMIN WITH MINERALS) TABS tablet Take 1 tablet by mouth 3 (three) times daily. For  low Vitamin (Patient not taking: Reported on 01/21/2018) 60 tablet 0 Not Taking at Unknown time  . nicotine (NICODERM CQ - DOSED IN MG/24 HOURS) 21 mg/24hr patch Place 1 patch (21 mg total) onto the skin daily. For smoking cessation (Patient not taking: Reported on 01/21/2018) 28 patch 0 Not Taking at Unknown time  . traZODone (DESYREL) 100 MG tablet Take 1 tablet (100 mg total) by mouth at bedtime. For sleep 30 tablet 0 01/20/2018 at Unknown time    Patient Stressors: Financial difficulties Marital or family conflict Substance abuse  Patient Strengths: Capable of independent living Wellsite geologist fund of knowledge Motivation for treatment/growth Work skills  Treatment Modalities: Medication Management, Group therapy, Case management,  1 to 1 session with clinician, Psychoeducation, Recreational therapy.   Physician Treatment Plan for Primary Diagnosis: MDD, recurrent, severe  Medication Management: Evaluate patient's response, side effects, and tolerance of medication regimen.  Therapeutic Interventions: 1 to 1 sessions, Unit Group sessions and Medication administration.  Evaluation of Outcomes: Adequate for discharge   Physician Treatment Plan for Secondary Diagnosis: Principal Problem:   MDD (major depressive disorder), recurrent severe, without psychosis (HCC) Active Problems:   Alcohol use disorder, severe, dependence (HCC)  Medication Management: Evaluate patient's response, side effects, and tolerance of medication regimen.  Therapeutic Interventions: 1 to 1 sessions, Unit Group sessions and Medication administration.  Evaluation of Outcomes: Adequate for discharge   RN Treatment Plan for Primary Diagnosis: MDD, recurrent, severe Long Term Goal(s): Knowledge of disease and therapeutic regimen to maintain health will improve  Short Term Goals: Ability to remain free from injury will improve, Ability to disclose and discuss suicidal ideas and Ability to identify  and develop effective coping behaviors will improve  Medication Management: RN will administer medications as ordered by provider, will assess and evaluate patient's response and provide education to patient for prescribed medication. RN will report any adverse and/or side effects to prescribing provider.  Therapeutic Interventions: 1 on 1 counseling sessions, Psychoeducation, Medication administration, Evaluate responses to treatment, Monitor vital signs and CBGs as ordered, Perform/monitor CIWA, COWS, AIMS and Fall Risk screenings as ordered, Perform wound care treatments as ordered.  Evaluation of Outcomes: Adequate for discharge   LCSW Treatment Plan for Primary Diagnosis: MDD, recurrent, severe Long Term Goal(s): Safe transition to appropriate next level of care at discharge, Engage patient in therapeutic group addressing interpersonal concerns.  Short Term Goals: Engage patient in aftercare planning with referrals and resources, Facilitate patient progression through stages of change regarding substance use diagnoses and concerns and Identify triggers associated with mental health/substance abuse issues  Therapeutic Interventions: Assess for all discharge needs, 1 to  1 time with Child psychotherapist, Explore available resources and support systems, Assess for adequacy in community support network, Educate family and significant other(s) on suicide prevention, Complete Psychosocial Assessment, Interpersonal group therapy.  Evaluation of Outcomes:  Adequate for discharge   Progress in Treatment: Attending groups: Yes Participating in groups: Yes Taking medication as prescribed: Yes. Toleration medication: Yes. Family/Significant other contact made: SPE completed with pt; pt declined to consent to collateral contact.  Patient understands diagnosis: Yes. Discussing patient identified problems/goals with staff: Yes. Medical problems stabilized or resolved: Yes. Denies suicidal/homicidal ideation:  Yes. Issues/concerns per patient self-inventory: No. Other: n/a   New problem(s) identified: No, Describe:  n/a  New Short Term/Long Term Goal(s): detox, medication management for mood stabilization; elimination of SI thoughts; development of comprehensive mental wellness/sobriety plan.   Patient Goals:  "To get sober and into a recovery program. I need medication help with my anxiety and depression."   Discharge Plan or Barriers: Pt was accepted to [facility] but turned down bed. She was accepted back [facility] her [facility]. She requested [facility]-up at [facility]. [facility] pamphlet, Mobile Crisis information, and AA/NA information provided to patient for additional community support and resources.   Reason for Continuation of Hospitalization: none  Estimated Length of Stay: Wed, 01/27/18  Attendees: Patient:  01/27/2018 9:49 AM  Physician: Dr. Altamese [place] MD; Dr. Jama Flavors MD 01/27/2018 9:49 AM  Nursing: Huntley Dec RN; Velna Hatchet RN 01/27/2018 9:49 AM  RN Care Manager: Juliann Pares 01/27/2018 9:49 AM  Social Worker: Corrie Mckusick LCSW 01/27/2018 9:49 AM  Recreational Therapist: x 01/27/2018 9:49 AM  Other: Armandina Stammer NP 01/27/2018 9:49 AM  Other:  01/27/2018 9:49 AM  Other: 01/27/2018 9:49 AM    Scribe for Treatment Team: Rona Ravens, LCSW 01/27/2018 9:49 AM

## 2018-01-27 NOTE — [facility] Suicide Risk Assessment (Signed)
[facility] INPATIENT:  Family/Significant Other Suicide Prevention Education  Suicide Prevention Education:  Patient Refusal for Family/Significant Other Suicide Prevention Education: The patient Carla Park has refused to provide written consent for family/significant other to be provided Family/Significant Other Suicide Prevention Education during admission and/or prior to discharge.  Physician notified.  SPE completed with pt, as pt refused to consent to family contact. [facility] pamphlet provided to pt and pt was encouraged to share information with support network, ask questions, and talk about any concerns relating to SPE. Pt denies access to guns/firearms and verbalized understanding of information provided. Mobile Crisis information also provided to pt.   Rona Ravens LCSW 01/27/2018, 9:48 AM

## 2018-01-27 NOTE — Discharge Summary (Addendum)
Physician Discharge Summary Note  Patient:  Carla Park is an 37 y.o., female  MRN:  [id number]  DOB:  10-17-1980  Patient phone:  [phone] (home)   Patient address:   [address] [place] [address],  Total Time spent with patient: Greater than 30 minutres  Date of Admission:  01/21/2018  Date of Discharge: 01-27-18  Reason for Admission: Worsening depression, SI with plan to cut her wrists, anxiety and relapse of alcohol use.   Principal Problem: MDD (major depressive disorder), recurrent severe, without psychosis [facility])  Discharge Diagnoses: Patient Active Problem List   Diagnosis Date Noted  . Alcohol use disorder, moderate, dependence (HCC) [F10.20] 01/28/2017    Priority: High  . Alcohol use disorder, severe, dependence (HCC) [F10.20] 01/22/2018  . MDD (major depressive disorder) [F32.9] 09/12/2017  . Cervical radiculopathy at C6 [M54.12] 05/19/2017  . Hepatitis C infection [B19.20] 05/19/2017  . Multiple joint pain [M25.50] 02/06/2017  . 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 Memory loss [R41.3] 02/06/2017  . Family history of MS (multiple sclerosis) [Z82.0] 02/06/2017  . MDD (major depressive disorder), recurrent severe, without psychosis (HCC) [F33.2] 01/14/2017  . 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 Alcohol withdrawal (HCC) [F10.239] 05/29/2015  . Chronic pain [G89.29]   . Fibromyalgia [M79.7]   . 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 Migraines [G43.909] 11/18/2012  . RA (rheumatoid arthritis) (HCC) [M06.9] 11/18/2012   Past Psychiatric History: Alcohol use disorder, MDD  Past Medical History:  Past Medical History:  Diagnosis Date  . Arthritis   . Chronic pain   . Depression   . ETOH abuse   . Fibromyalgia   . Hep C w/ coma, chronic (HCC)   . Hepatitis C   . Opiate abuse, continuous (HCC)    on Methadone currently  . Suicide attempt (HCC)    cut wrist  . Vision abnormalities      Past Surgical History:  Procedure Laterality Date  . ABDOMINAL HYSTERECTOMY    . APPENDECTOMY    . CESAREAN SECTION    . CHOLECYSTECTOMY    . KNEE SURGERY     Family History:  Family History  Problem Relation Age of Onset  . Multiple sclerosis Mother   . Hypertension Father    Family Psychiatric  History: See H&P  Social History:  Social History   Substance and Sexual Activity  Alcohol Use Yes   Comment: 2-500 ml boxes daily at least     Social History   Substance and Sexual Activity  Drug Use No    Social History   Socioeconomic History  . Marital status: Married    Spouse name: Not on file  . Number of children: Not on file  . Years of education: Not on file  . Highest education level: Not on file  Occupational History  . Not on file  Social Needs  . Financial resource strain: Not on file  . Food insecurity:    Worry: Not on file    Inability: Not on file  . Transportation needs:    Medical: Not on file    Non-medical: Not on file  Tobacco Use  . Smoking status: Current Every Day Smoker    Packs/day: 1.00    Types: Cigarettes  . Smokeless tobacco: Never Used  Substance and Sexual Activity  . Alcohol use: Yes    Comment: 2-500 ml boxes daily at least  . Drug use: No  .  Sexual activity: Yes    Birth control/protection: Surgical  Lifestyle  . Physical activity:    Days per week: Not on file    Minutes per session: Not on file  . Stress: Not on file  Relationships  . Social connections:    Talks on phone: Not on file    Gets together: Not on file    Attends religious service: Not on file    Active member of club or organization: Not on file    Attends meetings of clubs or organizations: Not on file    Relationship status: Not on file  Other Topics Concern  . Not on file  Social History Narrative  . Not on file   Hospital Course: (Per Md's discharge SRA): Carla Park is a 37 y/o F with history of MDD and alcohol use disorder who was  admitted voluntarily from [facility] where he was brought in by her husband with worsening depression, SI with plan to cut her wrists, anxiety, and relapse of alcohol use. Pt shares recent stressor of homelessness after being asked to leave her halfway house due to alcohol use. Pt was medically cleared and then transferred to [facility] for additional treatment and evaluation. She had stopped her medications prior to admission, so she was resumed on Gabapentin 600 mg, Trazodone 100, Cymbalta 40 mg, Clonidine 0.1 mg, and Methadone 170 mg . She was started on a trial of Abilify 10 mg tablet and transitioned to long-acting injectable form  Abilify ER 400 mg IM Q 28 days. She reported improvement of her presenting symptoms during her admission.   Besides the use of the above medication regimen to stabilize her presenting mood symptoms, Carla Park also received Librium 25 mg on a prn basis for alcohol withdrawal symptoms. She was also enrolled & participated in the group counseling sessions being offered & held on this unit. She learned coping skills. She was resumed & discharged on all her pertinent home medications for all her pre-existing medical issues reported. She tolerated her treatment regimen without any adverse effects or reactions reported.  Today upon her discharge evaluation, pt shares, "I'm pretty good - I got accepted back into my same [facility], which is pretty rare." Pt denies any specific concerns today. She is sleeping well. Her appetite is good. She denies other physical complaints. She denies SI/HI/AH/VH. She is tolerating her medications well, and she denies side effects. She is in agreement to continue her current regimen without changes. She agrees to have outpatient follow up at [facility]. She was able to engage in safety planning including plan to return to [facility] or contact emergency services if she feels unable to maintain her own safety or the safety of others. Pt had no further questions, comments, or  concerns.  Upon discharge, Carla Park presents mentally & medically stable. She will resume substance abuse & mental health treatments at the sober house where she was residing prior to this current admission. And for medication management, she will receive this services on an outpatient basis as noted below. She is provided with all the necessary information needed to continue these services. She left [facility] with all personal belongings in no apparent distress. Transportation per husband.  Physical Findings: AIMS: Facial and Oral Movements Muscles of Facial Expression: None, normal Lips and Perioral Area: None, normal Jaw: None, normal Tongue: None, normal,Extremity Movements Upper (arms, wrists, hands, fingers): Minimal Lower (legs, knees, ankles, toes): None, normal, Trunk Movements Neck, shoulders, hips: None, normal, Overall Severity Severity of abnormal movements (  highest score from questions above): Minimal Incapacitation due to abnormal movements: None, normal Patient's awareness of abnormal movements (rate only patient's report): Aware, no distress, Dental Status Current problems with teeth and/or dentures?: No Does patient usually wear dentures?: Yes  CIWA:  CIWA-Ar Total: 3 COWS:     Musculoskeletal: Strength & Muscle Tone: within normal limits Gait & Station: normal Patient leans: N/A  Psychiatric Specialty Exam: Physical Exam  Constitutional: She is oriented to person, place, and time. She appears well-developed.  HENT:  Head: Normocephalic.  Eyes: Pupils are equal, round, and reactive to light.  Neck: Normal range of motion.  Cardiovascular: Normal rate.  Respiratory: Effort normal.  GI: Soft.  Genitourinary:  Genitourinary Comments: Deferred  Musculoskeletal: Normal range of motion.  Neurological: She is alert and oriented to person, place, and time.  Skin: Skin is warm and dry.    Review of Systems  Constitutional: Negative.   HENT: Negative.   Eyes: Negative.    Respiratory: Negative.   Cardiovascular: Negative.   Gastrointestinal: Negative.   Genitourinary: Negative.   Musculoskeletal: Negative.   Skin: Negative.   Neurological: Negative.   Endo/Heme/Allergies: Negative.   Psychiatric/Behavioral: Positive for depression (Stable). Negative for suicidal ideas.    Blood pressure 108/78, pulse (!) 101, temperature 98.6 F (37 C), temperature source Oral, resp. rate 16, height 5\' 2"  (1.575 m), weight 64.9 kg (143 lb).Body mass index is 26.16 kg/m.  See Md's SRA   Have you used any form of tobacco in the last 30 days? (Cigarettes, Smokeless Tobacco, Cigars, and/or Pipes): Yes  Has this patient used any form of tobacco in the last 30 days? (Cigarettes, Smokeless Tobacco, Cigars, and/or Pipes):Yes, provided with Nicotine patch prescription to assist with smoking cessation.  Blood Alcohol level:  Lab Results  Component Value Date   ETH <10 01/21/2018   ETH 152 (H) 09/12/2017   Metabolic Disorder Labs:  Lab Results  Component Value Date   HGBA1C 4.6 (L) 01/22/2018   MPG 85.32 01/22/2018   MPG 91.06 09/14/2017   Lab Results  Component Value Date   PROLACTIN 20.2 01/22/2018   Lab Results  Component Value Date   CHOL 222 (H) 01/22/2018   TRIG 77 01/22/2018   HDL 119 01/22/2018   CHOLHDL 1.9 01/22/2018   VLDL 15 01/22/2018   LDLCALC 88 01/22/2018   LDLCALC 105 (H) 09/14/2017    See Psychiatric Specialty Exam and Suicide Risk Assessment completed by Attending Physician prior to discharge.  Discharge destination:  Home  Is patient on multiple antipsychotic therapies at discharge:  No   Has Patient had three or more failed trials of antipsychotic monotherapy by history:  No  Recommended Plan for Multiple Antipsychotic Therapies: NA  Allergies as of 01/27/2018      Reactions   Erythromycin Hives   Lamictal [lamotrigine] Dermatitis   Sumatriptan Other (See Comments)   Becomes angry      Medication List    STOP taking these  medications   etodolac 400 MG tablet Commonly known as:  LODINE Replaced by:  etodolac 200 MG capsule   HUMIRA PEN 40 MG/0.4ML Pnkt Generic drug:  Adalimumab   methadone 10 MG/ML solution Commonly known as:  DOLOPHINE Replaced by:  methadone 10 MG tablet     TAKE these medications     Indication  ARIPiprazole 10 MG tablet Commonly known as:  ABILIFY Take 1 tablet (10 mg total) by mouth daily. For mood control Start taking on:  01/28/2018 What changed:  medication strength  how much to take  Indication:  Mood control   ARIPiprazole ER 400 MG Srer injection Commonly known as:  ABILIFY MAINTENA Inject 2 mLs (400 mg total) into the muscle every 28 (twenty-eight) days. (Due on 02-22-18): For mood control Start taking on:  02/22/2018 What changed:  You were already taking a medication with the same name, and this prescription was added. Make sure you understand how and when to take each.  Indication:  Mood control   cloNIDine 0.1 MG tablet Commonly known as:  CATAPRES Take 1 tablet (0.1 mg total) by mouth 4 (four) times daily - after meals and at bedtime. For high blood pressure & anxiety symptoms What changed:  additional instructions  Indication:  High Blood Pressure Disorder, Anxiety   DULoxetine HCl 40 MG Cpep Take 40 mg by mouth daily. For depression Start taking on:  01/28/2018 What changed:    medication strength  how much to take  Indication:  Major Depressive Disorder   etodolac 200 MG capsule Commonly known as:  LODINE Take 2 capsules (400 mg total) by mouth 2 (two) times daily. For pain management Replaces:  etodolac 400 MG tablet  Indication:  Pain   gabapentin 300 MG capsule Commonly known as:  NEURONTIN Take 2 capsules (600 mg total) by mouth 3 (three) times daily. For agitation  Indication:  Agitation   hydrOXYzine 50 MG tablet Commonly known as:  ATARAX/VISTARIL Take 1 tablet (50 mg total) by mouth every 6 (six) hours as needed for anxiety. What  changed:    how much to take  how to take this  when to take this  reasons to take this  additional instructions  Indication:  Feeling Anxious   levothyroxine 25 MCG tablet Commonly known as:  SYNTHROID, LEVOTHROID Take 1 tablet (25 mcg total) by mouth daily before breakfast. For thyroid hormone replacement Start taking on:  01/28/2018 What changed:  additional instructions  Indication:  Underactive Thyroid   methadone 10 MG tablet Commonly known as:  DOLOPHINE Take 17 tablets (170 mg total) by mouth daily. For opioid addiction Start taking on:  01/28/2018 Replaces:  methadone 10 MG/ML solution  Indication:  Opioid Dependence   multivitamin with minerals Tabs tablet Take 1 tablet by mouth daily. (May purchase from over the counter): Vitamin supplement Start taking on:  01/28/2018 What changed:    when to take this  additional instructions  Indication:  Vitamin supplement   nicotine 21 mg/24hr patch Commonly known as:  NICODERM CQ - dosed in mg/24 hours Place 1 patch (21 mg total) onto the skin daily. (May purchase from over the counter): For smoking cessation Start taking on:  01/28/2018 What changed:  additional instructions  Indication:  Nicotine Addiction   traZODone 100 MG tablet Commonly known as:  DESYREL Take 1 tablet (100 mg total) by mouth at bedtime. For sleep  Indication:  Trouble Sleeping      Follow-up Information    [facility] Follow up on 02/02/2018.   Specialty:  Behavioral Health Why:  Hospital follow-up on Tuesday, 6/18 at 8:00AM. Please bring: Photo ID, social security card, and insurance card to this appt. Thank you.  [address] [place] [address] [phone]          Follow-up recommendations: Activity:  As tolerated Diet: As recommended by your primary care doctor. Keep all scheduled follow-up appointments as recommended.   Comments: Patient is instructed prior to discharge to: Take all medications as  prescribed  by his/her mental healthcare provider. Report any adverse effects and or reactions from the medicines to his/her outpatient provider promptly. Patient has been instructed & cautioned: To not engage in alcohol and or illegal drug use while on prescription medicines. In the event of worsening symptoms, patient is instructed to call the crisis hotline, 911 and or go to the nearest ED for appropriate evaluation and treatment of symptoms. To follow-up with his/her primary care provider for your other medical issues, concerns and or health care needs.   Signed: Armandina Stammer, NP, PMHNP, FNP-BC 01/27/2018, 10:01 AM   Patient seen, Suicide Assessment Completed.  Disposition Plan Reviewed

## 2018-01-27 NOTE — Progress Notes (Signed)
D: Patient verbalizes readiness for discharge. Rates her anxiety feels 7/10 but that she is grateful to return back to [facility]. Denies suicidal and homicidal ideations. Denies auditory and visual hallucinations.  No complaints of pain.  A: Patient receptive to discharge instructions, transition record, AVS and SRI given to pt.  Questions encouraged, both verbalize understanding.  R:  Escorted to the lobby by this RN.

## 2018-01-27 NOTE — Progress Notes (Signed)
  [facility] Adult Case Management Discharge Plan :  Will you be returning to the same living situation after discharge:  Yes,  back to [facility]-pt re-accepted. At discharge, do you have transportation home?: Yes,  husband Do you have the ability to pay for your medications: Yes,  [employer] insurance  Release of information consent forms completed and submitted to medical records by CSW.  Patient to Follow up at: Follow-up Information    [facility] Follow up on 02/02/2018.   Specialty:  Behavioral Health Why:  Hospital follow-up on Tuesday, 6/18 at 8:00AM. Please bring: Photo ID, social security card, and insurance card to this appt. Thank you.  Contact information: [address] [place] [address] [phone]           Next level of care provider has access to [facility] Link:no  Safety Planning and Suicide Prevention discussed: Yes,  SPE completed with pt; pt declined to consent to collateral contact. SPI pamphlet and Mobile Crisis information provided.   Have you used any form of tobacco in the last 30 days? (Cigarettes, Smokeless Tobacco, Cigars, and/or Pipes): Yes  Has patient been referred to the Quitline?: Patient refused referral  Patient has been referred for addiction treatment: Yes  Rona Ravens, LCSW 01/27/2018, 9:48 AM

## 2018-01-27 NOTE — Therapy (Signed)
Occupational Therapy Group Treatment Note  Date:  01/27/2018 Time:  11:18 AM  Group Topic/Focus:  Stress Management  Participation Level:  Active  Participation Quality:  Appropriate, Drowsy and Sharing  Affect:  Depressed and Flat  Cognitive:  Appropriate  Insight: Improving  Engagement in Group:  Engaged. Intermittent periods of drowsiness  Modes of Intervention:  Activity, Discussion and Education  Additional Comments:    S: "All of my coping strategies are bad, like I used to sell my belongings for drugs"  O: Stress management group completed to use as productive coping strategy, to help mitigate maladaptive coping to integrate in functional BADL/IADL when reintegrating into community. Education given on the definition of stress and its cognitive, behavioral, emotional, and physical effects on the body. Stress management tools worksheet completed to identify negative coping mechanisms and their short and long term effects vs positive coping mechanisms with demonstration. Coping strategies taught include: relaxation based- deep breathing, counting to 10, taking a 1 minute vacation, acceptance, stress balls, relaxation audio/video, visual/mental imagery. Positive mental attitude- gratitude, acceptance, cognitive reframing, positive self talk, anger management. Self control circle activity completed to identify areas of control and areas not within personal control to facilitate acceptance in daily relationships and life. Progressive muscle relaxation script administered to facilitate relaxation response for more productive engagement in BADL/IADL. Adult coloring and relaxation tips worksheet given at end of session.   A: Pt initially in dayroom prior to group starting, engaging with other peers. Pt with flat and depressed affect. Pt initiating conversation, offering examples, and relating to other group members and facilitator. Pt engaged in stress management tools worksheet, stating she  often suppresses her emotions and engages in substance abuse. She reports she would like to use relaxation strategies and gratitudes to help combat stress in a more productive way this date. Pt reports "this stuff was so awesome, thank you for this". Pt completed self control circle activity, identifying that she may not always be in control of having addiction, but she can control if she engages in recovery consistently. Pt engaged in PMR script with a facilitated relaxation response. Pt eager to take handouts from facilitator.  P: Pt provided with education on stress management activities to implement into daily routine. Handouts given to facilitate carryover when reintegrating into community.      Dalphine Handing, MSOT, OTR/L  [place] 01/27/2018, 11:18 AM

## 2018-01-27 NOTE — Progress Notes (Signed)
Per pt request, MD completed Short Term Disability form and CSW faxed to number provided [phone]).   Gayna Braddy S. Alan Ripper, MSW, LCSW Clinical Social Worker 01/27/2018 9:55 AM

## 2018-01-27 NOTE — [facility] Group Notes (Signed)
[facility] Group Therapy 01/27/2018 1:15pm  Type of Therapy: Mental Health Association Presentation  Participation Level: Active  Participation Quality: Attentive  Affect: Appropriate  Cognitive: Oriented  Insight: Developing/Improving  Engagement in Therapy: Engaged  Modes of Intervention: Discussion, Education and Socialization  Summary of Progress/Problems: Mental Health Association (MHA) Speaker came to talk about his personal journey with mental health. The pt processed ways by which to relate to the speaker. MHA speaker provided handouts and educational information pertaining to groups and services offered by the MHA. Pt was engaged in speaker's presentation and was receptive to resources provided.    Jannet Calip S Liyana Suniga, LCSW 01/26/2018 2:00PM 

## 2018-11-09 ENCOUNTER — Inpatient Hospital Stay (HOSPITAL_COMMUNITY)
Admission: EM | Admit: 2018-11-09 | Discharge: 2018-11-30 | DRG: 957 | Disposition: A | Discharge status: Home Health | Payer: BLUE CROSS/BLUE SHIELD | Attending: Physician Assistant | Admitting: Physician Assistant

## 2018-11-09 ENCOUNTER — Emergency Department (HOSPITAL_COMMUNITY): Payer: BLUE CROSS/BLUE SHIELD

## 2018-11-09 ENCOUNTER — Inpatient Hospital Stay (HOSPITAL_COMMUNITY): Payer: BLUE CROSS/BLUE SHIELD | Admitting: Anesthesiology

## 2018-11-09 ENCOUNTER — Encounter (HOSPITAL_COMMUNITY): Payer: Self-pay | Admitting: Cardiothoracic Surgery

## 2018-11-09 ENCOUNTER — Encounter (HOSPITAL_COMMUNITY): Admission: EM | Disposition: A | Discharge status: Home Health | Payer: Self-pay | Source: Home / Self Care

## 2018-11-09 ENCOUNTER — Inpatient Hospital Stay (HOSPITAL_COMMUNITY): Payer: BLUE CROSS/BLUE SHIELD

## 2018-11-09 DIAGNOSIS — F10129 Alcohol abuse with intoxication, unspecified: Secondary | ICD-10-CM | POA: Diagnosis present

## 2018-11-09 DIAGNOSIS — J155 Pneumonia due to Escherichia coli: Secondary | ICD-10-CM | POA: Diagnosis not present

## 2018-11-09 DIAGNOSIS — S12101A Unspecified nondisplaced fracture of second cervical vertebra, initial encounter for closed fracture: Secondary | ICD-10-CM | POA: Diagnosis present

## 2018-11-09 DIAGNOSIS — J14 Pneumonia due to Hemophilus influenzae: Secondary | ICD-10-CM | POA: Diagnosis not present

## 2018-11-09 DIAGNOSIS — R451 Restlessness and agitation: Secondary | ICD-10-CM | POA: Diagnosis not present

## 2018-11-09 DIAGNOSIS — R4182 Altered mental status, unspecified: Secondary | ICD-10-CM

## 2018-11-09 DIAGNOSIS — S27893A Laceration of other specified intrathoracic organs, initial encounter: Secondary | ICD-10-CM | POA: Diagnosis present

## 2018-11-09 DIAGNOSIS — K5900 Constipation, unspecified: Secondary | ICD-10-CM | POA: Diagnosis not present

## 2018-11-09 DIAGNOSIS — R402142 Coma scale, eyes open, spontaneous, at arrival to emergency department: Secondary | ICD-10-CM | POA: Diagnosis present

## 2018-11-09 DIAGNOSIS — J9601 Acute respiratory failure with hypoxia: Secondary | ICD-10-CM | POA: Diagnosis present

## 2018-11-09 DIAGNOSIS — S32412A Displaced fracture of anterior wall of left acetabulum, initial encounter for closed fracture: Secondary | ICD-10-CM | POA: Diagnosis present

## 2018-11-09 DIAGNOSIS — S32811A Multiple fractures of pelvis with unstable disruption of pelvic ring, initial encounter for closed fracture: Secondary | ICD-10-CM | POA: Diagnosis present

## 2018-11-09 DIAGNOSIS — D72829 Elevated white blood cell count, unspecified: Secondary | ICD-10-CM

## 2018-11-09 DIAGNOSIS — R402362 Coma scale, best motor response, obeys commands, at arrival to emergency department: Secondary | ICD-10-CM | POA: Diagnosis present

## 2018-11-09 DIAGNOSIS — S2243XA Multiple fractures of ribs, bilateral, initial encounter for closed fracture: Secondary | ICD-10-CM | POA: Diagnosis present

## 2018-11-09 DIAGNOSIS — S32810A Multiple fractures of pelvis with stable disruption of pelvic ring, initial encounter for closed fracture: Secondary | ICD-10-CM

## 2018-11-09 DIAGNOSIS — S329XXA Fracture of unspecified parts of lumbosacral spine and pelvis, initial encounter for closed fracture: Secondary | ICD-10-CM

## 2018-11-09 DIAGNOSIS — J9690 Respiratory failure, unspecified, unspecified whether with hypoxia or hypercapnia: Secondary | ICD-10-CM

## 2018-11-09 DIAGNOSIS — N3289 Other specified disorders of bladder: Secondary | ICD-10-CM | POA: Diagnosis present

## 2018-11-09 DIAGNOSIS — S32401A Unspecified fracture of right acetabulum, initial encounter for closed fracture: Secondary | ICD-10-CM | POA: Diagnosis present

## 2018-11-09 DIAGNOSIS — Y908 Blood alcohol level of 240 mg/100 ml or more: Secondary | ICD-10-CM | POA: Diagnosis present

## 2018-11-09 DIAGNOSIS — S12041A Nondisplaced lateral mass fracture of first cervical vertebra, initial encounter for closed fracture: Secondary | ICD-10-CM | POA: Diagnosis present

## 2018-11-09 DIAGNOSIS — S3729XA Other injury of bladder, initial encounter: Secondary | ICD-10-CM | POA: Diagnosis present

## 2018-11-09 DIAGNOSIS — E039 Hypothyroidism, unspecified: Secondary | ICD-10-CM | POA: Diagnosis present

## 2018-11-09 DIAGNOSIS — S066X9A Traumatic subarachnoid hemorrhage with loss of consciousness of unspecified duration, initial encounter: Secondary | ICD-10-CM | POA: Diagnosis present

## 2018-11-09 DIAGNOSIS — S36503A Unspecified injury of sigmoid colon, initial encounter: Secondary | ICD-10-CM | POA: Diagnosis present

## 2018-11-09 DIAGNOSIS — S7012XA Contusion of left thigh, initial encounter: Secondary | ICD-10-CM | POA: Diagnosis not present

## 2018-11-09 DIAGNOSIS — Z79890 Hormone replacement therapy (postmenopausal): Secondary | ICD-10-CM

## 2018-11-09 DIAGNOSIS — S129XXA Fracture of neck, unspecified, initial encounter: Secondary | ICD-10-CM

## 2018-11-09 DIAGNOSIS — E873 Alkalosis: Secondary | ICD-10-CM | POA: Diagnosis present

## 2018-11-09 DIAGNOSIS — Z9289 Personal history of other medical treatment: Secondary | ICD-10-CM

## 2018-11-09 DIAGNOSIS — R402252 Coma scale, best verbal response, oriented, at arrival to emergency department: Secondary | ICD-10-CM | POA: Diagnosis present

## 2018-11-09 DIAGNOSIS — D696 Thrombocytopenia, unspecified: Secondary | ICD-10-CM | POA: Diagnosis present

## 2018-11-09 DIAGNOSIS — Z79899 Other long term (current) drug therapy: Secondary | ICD-10-CM

## 2018-11-09 DIAGNOSIS — J9811 Atelectasis: Secondary | ICD-10-CM | POA: Diagnosis present

## 2018-11-09 DIAGNOSIS — F1110 Opioid abuse, uncomplicated: Secondary | ICD-10-CM | POA: Diagnosis present

## 2018-11-09 DIAGNOSIS — I712 Thoracic aortic aneurysm, without rupture: Secondary | ICD-10-CM | POA: Diagnosis not present

## 2018-11-09 DIAGNOSIS — S066X0A Traumatic subarachnoid hemorrhage without loss of consciousness, initial encounter: Secondary | ICD-10-CM

## 2018-11-09 DIAGNOSIS — S27892A Contusion of other specified intrathoracic organs, initial encounter: Secondary | ICD-10-CM | POA: Diagnosis present

## 2018-11-09 DIAGNOSIS — Z79891 Long term (current) use of opiate analgesic: Secondary | ICD-10-CM

## 2018-11-09 DIAGNOSIS — E876 Hypokalemia: Secondary | ICD-10-CM | POA: Diagnosis not present

## 2018-11-09 DIAGNOSIS — Z9889 Other specified postprocedural states: Secondary | ICD-10-CM

## 2018-11-09 DIAGNOSIS — S3282XA Multiple fractures of pelvis without disruption of pelvic ring, initial encounter for closed fracture: Secondary | ICD-10-CM | POA: Diagnosis present

## 2018-11-09 DIAGNOSIS — S3509XA Other injury of abdominal aorta, initial encounter: Secondary | ICD-10-CM

## 2018-11-09 DIAGNOSIS — Z90710 Acquired absence of both cervix and uterus: Secondary | ICD-10-CM

## 2018-11-09 DIAGNOSIS — D62 Acute posthemorrhagic anemia: Secondary | ICD-10-CM | POA: Diagnosis present

## 2018-11-09 DIAGNOSIS — Z7984 Long term (current) use of oral hypoglycemic drugs: Secondary | ICD-10-CM

## 2018-11-09 DIAGNOSIS — I739 Peripheral vascular disease, unspecified: Secondary | ICD-10-CM | POA: Diagnosis not present

## 2018-11-09 DIAGNOSIS — J9600 Acute respiratory failure, unspecified whether with hypoxia or hypercapnia: Secondary | ICD-10-CM

## 2018-11-09 HISTORY — PX: LAPAROTOMY: SHX154

## 2018-11-09 HISTORY — DX: Displaced fracture of anterior wall of left acetabulum, initial encounter for closed fracture: S32.412A

## 2018-11-09 LAB — CBC
HCT: 50.5 % — ABNORMAL HIGH (ref 36.0–46.0)
Hemoglobin: 16.8 g/dL — ABNORMAL HIGH (ref 12.0–15.0)
MCH: 31.3 pg (ref 26.0–34.0)
MCHC: 33.3 g/dL (ref 30.0–36.0)
MCV: 94 fL (ref 80.0–100.0)
Platelets: 205 10*3/uL (ref 150–400)
RBC: 5.37 MIL/uL — ABNORMAL HIGH (ref 3.87–5.11)
RDW: 14.2 % (ref 11.5–15.5)
WBC: 12.8 10*3/uL — ABNORMAL HIGH (ref 4.0–10.5)
nRBC: 0.2 % (ref 0.0–0.2)

## 2018-11-09 LAB — POCT I-STAT 7, (LYTES, BLD GAS, ICA,H+H)
Acid-base deficit: 2 mmol/L (ref 0.0–2.0)
Bicarbonate: 21 mmol/L (ref 20.0–28.0)
Calcium, Ion: 1.07 mmol/L — ABNORMAL LOW (ref 1.15–1.40)
HCT: 45 % (ref 36.0–46.0)
Hemoglobin: 15.3 g/dL — ABNORMAL HIGH (ref 12.0–15.0)
O2 Saturation: 97 %
Patient temperature: 98.6
Potassium: 2.9 mmol/L — ABNORMAL LOW (ref 3.5–5.1)
Sodium: 139 mmol/L (ref 135–145)
TCO2: 22 mmol/L (ref 22–32)
pCO2 arterial: 29.5 mmHg — ABNORMAL LOW (ref 32.0–48.0)
pH, Arterial: 7.46 — ABNORMAL HIGH (ref 7.350–7.450)
pO2, Arterial: 85 mmHg (ref 83.0–108.0)

## 2018-11-09 LAB — TYPE AND SCREEN
ABO/RH(D): A POS
Antibody Screen: NEGATIVE
Unit division: 0
Unit division: 0

## 2018-11-09 LAB — COMPREHENSIVE METABOLIC PANEL
ALT: 90 U/L — ABNORMAL HIGH (ref 0–44)
AST: 222 U/L — ABNORMAL HIGH (ref 15–41)
Albumin: 3.3 g/dL — ABNORMAL LOW (ref 3.5–5.0)
Alkaline Phosphatase: 255 U/L — ABNORMAL HIGH (ref 38–126)
Anion gap: 14 (ref 5–15)
BUN: <5 mg/dL — ABNORMAL LOW (ref 6–20)
CO2: 23 mmol/L (ref 22–32)
Calcium: 8.7 mg/dL — ABNORMAL LOW (ref 8.9–10.3)
Chloride: 103 mmol/L (ref 98–111)
Creatinine, Ser: 1.27 mg/dL — ABNORMAL HIGH (ref 0.44–1.00)
GFR calc Af Amer: >60 mL/min (ref >60)
GFR calc non Af Amer: 53 mL/min — ABNORMAL LOW (ref >60)
Glucose, Bld: 120 mg/dL — ABNORMAL HIGH (ref 70–99)
Potassium: 3.3 mmol/L — ABNORMAL LOW (ref 3.5–5.1)
Sodium: 140 mmol/L (ref 135–145)
Total Bilirubin: 0.7 mg/dL (ref 0.3–1.2)
Total Protein: 6.6 g/dL (ref 6.5–8.1)

## 2018-11-09 LAB — BPAM FFP
Blood Product Expiration Date: 202004052359
Blood Product Expiration Date: 202004062359
ISSUE DATE / TIME: 202003241658
ISSUE DATE / TIME: 202003241658
Unit Type and Rh: 6200
Unit Type and Rh: 6200

## 2018-11-09 LAB — URINALYSIS, ROUTINE W REFLEX MICROSCOPIC

## 2018-11-09 LAB — BPAM RBC
Blood Product Expiration Date: 202003272359
Blood Product Expiration Date: 202003272359
ISSUE DATE / TIME: 202003241658
ISSUE DATE / TIME: 202003241658
Unit Type and Rh: 9500
Unit Type and Rh: 9500

## 2018-11-09 LAB — PREPARE FRESH FROZEN PLASMA
Unit division: 0
Unit division: 0

## 2018-11-09 LAB — URINALYSIS, MICROSCOPIC (REFLEX)
Bacteria, UA: NONE SEEN
RBC / HPF: >50 RBC/hpf (ref 0–5)
WBC, UA: NONE SEEN WBC/hpf (ref 0–5)

## 2018-11-09 LAB — ABO/RH: ABO/RH(D): A POS

## 2018-11-09 LAB — PROTIME-INR
INR: 1 (ref 0.8–1.2)
Prothrombin Time: 13.5 seconds (ref 11.4–15.2)

## 2018-11-09 LAB — ETHANOL: Alcohol, Ethyl (B): 306 mg/dL (ref <10)

## 2018-11-09 LAB — MRSA PCR SCREENING: MRSA by PCR: NEGATIVE

## 2018-11-09 LAB — CDS SEROLOGY

## 2018-11-09 LAB — LACTIC ACID, PLASMA: Lactic Acid, Venous: 3.8 mmol/L (ref 0.5–1.9)

## 2018-11-09 SURGERY — LAPAROTOMY, EXPLORATORY
Anesthesia: General

## 2018-11-09 MED ORDER — SODIUM CHLORIDE 0.9 % IV SOLN: INTRAVENOUS | Status: DC | PRN

## 2018-11-09 MED ORDER — SUCCINYLCHOLINE CHLORIDE 20 MG/ML IJ SOLN
INTRAMUSCULAR | Status: AC | PRN
  Administered 2018-11-09: 120 mg via INTRAVENOUS

## 2018-11-09 MED ORDER — PROPOFOL 1000 MG/100ML IV EMUL
INTRAVENOUS | Status: AC
  Filled 2018-11-09: qty 100

## 2018-11-09 MED ORDER — PANTOPRAZOLE SODIUM 40 MG IV SOLR
40.0000 mg | Freq: Every day | INTRAVENOUS | Status: DC
  Administered 2018-11-09 – 2018-11-16 (×8): 40 mg via INTRAVENOUS
  Filled 2018-11-09 (×8): qty 40

## 2018-11-09 MED ORDER — PROPOFOL 1000 MG/100ML IV EMUL
0.0000 ug/kg/min | INTRAVENOUS | Status: DC
  Administered 2018-11-10: 30 ug/kg/min via INTRAVENOUS
  Administered 2018-11-10: 40 ug/kg/min via INTRAVENOUS
  Administered 2018-11-10: 30 ug/kg/min via INTRAVENOUS
  Administered 2018-11-10: 50 ug/kg/min via INTRAVENOUS
  Administered 2018-11-11 (×2): 40 ug/kg/min via INTRAVENOUS
  Administered 2018-11-11 – 2018-11-12 (×2): 25 ug/kg/min via INTRAVENOUS
  Administered 2018-11-12 – 2018-11-13 (×2): 20 ug/kg/min via INTRAVENOUS
  Administered 2018-11-13: 25 ug/kg/min via INTRAVENOUS
  Administered 2018-11-14: 20 ug/kg/min via INTRAVENOUS
  Administered 2018-11-14: 25 ug/kg/min via INTRAVENOUS
  Administered 2018-11-14 – 2018-11-15 (×2): 20 ug/kg/min via INTRAVENOUS
  Filled 2018-11-09 (×5): qty 100
  Filled 2018-11-09: qty 200
  Filled 2018-11-09 (×8): qty 100

## 2018-11-09 MED ORDER — SODIUM CHLORIDE 0.9 % IV SOLN
INTRAVENOUS | Status: DC | PRN
  Administered 2018-11-09: 23:00:00 via INTRAVENOUS

## 2018-11-09 MED ORDER — SODIUM CHLORIDE 0.9 % IV SOLN
INTRAVENOUS | Status: DC
  Administered 2018-11-09 – 2018-11-11 (×3): via INTRAVENOUS

## 2018-11-09 MED ORDER — ONDANSETRON 4 MG PO TBDP: 4.0000 mg | ORAL_TABLET | Freq: Four times a day (QID) | ORAL | Status: DC | PRN

## 2018-11-09 MED ORDER — ORAL CARE MOUTH RINSE
15.0000 mL | OROMUCOSAL | Status: DC
  Administered 2018-11-09 – 2018-11-17 (×77): 15 mL via OROMUCOSAL

## 2018-11-09 MED ORDER — PROPOFOL 10 MG/ML IV BOLUS
INTRAVENOUS | Status: AC | PRN
  Administered 2018-11-09: 273 ug via INTRAVENOUS

## 2018-11-09 MED ORDER — FENTANYL CITRATE (PF) 100 MCG/2ML IJ SOLN
100.0000 ug | INTRAMUSCULAR | Status: DC | PRN
  Administered 2018-11-09 – 2018-11-10 (×3): 100 ug via INTRAVENOUS
  Filled 2018-11-09 (×3): qty 2

## 2018-11-09 MED ORDER — HYDRALAZINE HCL 20 MG/ML IJ SOLN
10.0000 mg | INTRAMUSCULAR | Status: DC | PRN
  Administered 2018-11-11 – 2018-11-20 (×2): 10 mg via INTRAVENOUS
  Filled 2018-11-09 (×3): qty 1

## 2018-11-09 MED ORDER — VASOPRESSIN 20 UNIT/ML IV SOLN
0.2000 [IU]/min | INTRAVENOUS | Status: DC
  Filled 2018-11-09: qty 5

## 2018-11-09 MED ORDER — IOHEXOL 300 MG/ML  SOLN
100.0000 mL | Freq: Once | INTRAMUSCULAR | Status: AC | PRN
  Administered 2018-11-09: 100 mL via INTRAVENOUS

## 2018-11-09 MED ORDER — ROCURONIUM BROMIDE 50 MG/5ML IV SOLN
INTRAVENOUS | Status: AC | PRN
  Administered 2018-11-09: 100 mg via INTRAVENOUS

## 2018-11-09 MED ORDER — ETOMIDATE 2 MG/ML IV SOLN
INTRAVENOUS | Status: AC | PRN
  Administered 2018-11-09: 10 mg via INTRAVENOUS

## 2018-11-09 MED ORDER — VASOPRESSIN 20 UNIT/ML IV SOLN
0.0100 [IU]/min | INTRAVENOUS | Status: AC
  Filled 2018-11-09 (×2): qty 2

## 2018-11-09 MED ORDER — FENTANYL CITRATE (PF) 100 MCG/2ML IJ SOLN
100.0000 ug | INTRAMUSCULAR | Status: DC | PRN
  Administered 2018-11-10: 100 ug via INTRAVENOUS
  Filled 2018-11-09: qty 2

## 2018-11-09 MED ORDER — METOPROLOL TARTRATE 5 MG/5ML IV SOLN
5.0000 mg | Freq: Four times a day (QID) | INTRAVENOUS | Status: DC | PRN
  Administered 2018-11-11 – 2018-11-13 (×3): 5 mg via INTRAVENOUS
  Filled 2018-11-09 (×3): qty 5

## 2018-11-09 MED ORDER — ACETAMINOPHEN 325 MG PO TABS
650.0000 mg | ORAL_TABLET | ORAL | Status: DC | PRN
  Administered 2018-11-13 – 2018-11-14 (×2): 650 mg via ORAL
  Filled 2018-11-09 (×2): qty 2

## 2018-11-09 MED ORDER — LEVETIRACETAM IN NACL 500 MG/100ML IV SOLN
500.0000 mg | Freq: Two times a day (BID) | INTRAVENOUS | Status: AC
  Administered 2018-11-09 – 2018-11-16 (×14): 500 mg via INTRAVENOUS
  Filled 2018-11-09 (×16): qty 100

## 2018-11-09 MED ORDER — FENTANYL CITRATE (PF) 250 MCG/5ML IJ SOLN
INTRAMUSCULAR | Status: AC
  Filled 2018-11-09: qty 5

## 2018-11-09 MED ORDER — CEFAZOLIN SODIUM-DEXTROSE 2-3 GM-%(50ML) IV SOLR
INTRAVENOUS | Status: DC | PRN
  Administered 2018-11-09: 2 g via INTRAVENOUS

## 2018-11-09 MED ORDER — ONDANSETRON HCL 4 MG/2ML IJ SOLN: 4.0000 mg | Freq: Four times a day (QID) | INTRAMUSCULAR | Status: DC | PRN

## 2018-11-09 MED ORDER — CHLORHEXIDINE GLUCONATE 0.12% ORAL RINSE (MEDLINE KIT)
15.0000 mL | Freq: Two times a day (BID) | OROMUCOSAL | Status: DC
  Administered 2018-11-09 – 2018-11-22 (×26): 15 mL via OROMUCOSAL
  Filled 2018-11-09: qty 15

## 2018-11-09 MED ORDER — FENTANYL CITRATE (PF) 250 MCG/5ML IJ SOLN
INTRAMUSCULAR | Status: DC | PRN
  Administered 2018-11-09: 100 ug via INTRAVENOUS
  Administered 2018-11-09: 150 ug via INTRAVENOUS

## 2018-11-09 MED ORDER — NOREPINEPHRINE 4 MG/250ML-% IV SOLN
0.0000 ug/min | INTRAVENOUS | Status: DC
  Filled 2018-11-09: qty 250

## 2018-11-09 MED ORDER — 0.9 % SODIUM CHLORIDE (POUR BTL) OPTIME
TOPICAL | Status: DC | PRN
  Administered 2018-11-09 (×2): 1000 mL

## 2018-11-09 MED ORDER — POTASSIUM CHLORIDE 10 MEQ/100ML IV SOLN
10.0000 meq | INTRAVENOUS | Status: AC
  Administered 2018-11-09 – 2018-11-10 (×3): 10 meq via INTRAVENOUS
  Filled 2018-11-09: qty 100

## 2018-11-09 MED ORDER — BISACODYL 10 MG RE SUPP
10.0000 mg | Freq: Every day | RECTAL | Status: DC | PRN
  Administered 2018-11-20: 10 mg via RECTAL
  Filled 2018-11-09 (×2): qty 1

## 2018-11-09 MED ORDER — ROCURONIUM BROMIDE 50 MG/5ML IV SOSY
PREFILLED_SYRINGE | INTRAVENOUS | Status: DC | PRN
  Administered 2018-11-09: 50 mg via INTRAVENOUS

## 2018-11-09 MED ORDER — LABETALOL HCL 5 MG/ML IV SOLN
0.5000 mg/min | INTRAVENOUS | Status: DC
  Filled 2018-11-09: qty 100

## 2018-11-09 MED ORDER — PANTOPRAZOLE SODIUM 40 MG PO TBEC: 40.0000 mg | DELAYED_RELEASE_TABLET | Freq: Every day | ORAL | Status: DC

## 2018-11-09 SURGICAL SUPPLY — 51 items
BIOPATCH RED 1 DISK 7.0 (GAUZE/BANDAGES/DRESSINGS) ×2 IMPLANT
BLADE CLIPPER SURG (BLADE) IMPLANT
CANISTER SUCT 3000ML PPV (MISCELLANEOUS) ×2 IMPLANT
CHLORAPREP W/TINT 26ML (MISCELLANEOUS) ×2 IMPLANT
COVER SURGICAL LIGHT HANDLE (MISCELLANEOUS) ×4 IMPLANT
COVER WAND RF STERILE (DRAPES) ×2 IMPLANT
DRAPE LAPAROSCOPIC ABDOMINAL (DRAPES) ×2 IMPLANT
DRAPE WARM FLUID 44X44 (DRAPE) ×2 IMPLANT
DRSG OPSITE POSTOP 4X10 (GAUZE/BANDAGES/DRESSINGS) IMPLANT
DRSG OPSITE POSTOP 4X12 (GAUZE/BANDAGES/DRESSINGS) ×2 IMPLANT
DRSG OPSITE POSTOP 4X8 (GAUZE/BANDAGES/DRESSINGS) IMPLANT
DRSG TEGADERM 4X4.75 (GAUZE/BANDAGES/DRESSINGS) ×2 IMPLANT
ELECT BLADE 6.5 EXT (BLADE) IMPLANT
ELECT CAUTERY BLADE 6.4 (BLADE) IMPLANT
ELECT REM PT RETURN 9FT ADLT (ELECTROSURGICAL) ×2
ELECTRODE REM PT RTRN 9FT ADLT (ELECTROSURGICAL) ×1 IMPLANT
EVACUATOR SILICONE 100CC (DRAIN) ×2 IMPLANT
GLOVE BIO SURGEON STRL SZ7 (GLOVE) ×2 IMPLANT
GLOVE BIOGEL PI IND STRL 7.5 (GLOVE) ×1 IMPLANT
GLOVE BIOGEL PI INDICATOR 7.5 (GLOVE) ×1
GOWN STRL REUS W/ TWL LRG LVL3 (GOWN DISPOSABLE) ×2 IMPLANT
GOWN STRL REUS W/TWL LRG LVL3 (GOWN DISPOSABLE) ×2
HANDLE SUCTION POOLE (INSTRUMENTS) ×1 IMPLANT
KIT BASIN OR (CUSTOM PROCEDURE TRAY) ×2 IMPLANT
KIT TURNOVER KIT B (KITS) ×2 IMPLANT
LIGASURE IMPACT 36 18CM CVD LR (INSTRUMENTS) IMPLANT
NS IRRIG 1000ML POUR BTL (IV SOLUTION) ×4 IMPLANT
PACK GENERAL/GYN (CUSTOM PROCEDURE TRAY) ×2 IMPLANT
PAD ARMBOARD 7.5X6 YLW CONV (MISCELLANEOUS) ×2 IMPLANT
PENCIL SMOKE EVACUATOR (MISCELLANEOUS) ×2 IMPLANT
SPECIMEN JAR LARGE (MISCELLANEOUS) IMPLANT
SPONGE LAP 18X18 RF (DISPOSABLE) IMPLANT
STAPLER VISISTAT 35W (STAPLE) ×2 IMPLANT
SUCTION POOLE HANDLE (INSTRUMENTS) ×2
SUCTION POOLE TIP (SUCTIONS) ×2 IMPLANT
SUT ETHILON 3 0 FSL (SUTURE) ×2 IMPLANT
SUT PDS AB 1 TP1 96 (SUTURE) ×4 IMPLANT
SUT SILK 2 0 (SUTURE) ×1
SUT SILK 2 0 SH CR/8 (SUTURE) ×2 IMPLANT
SUT SILK 2-0 18XBRD TIE 12 (SUTURE) ×1 IMPLANT
SUT SILK 3 0 (SUTURE) ×1
SUT SILK 3 0 SH CR/8 (SUTURE) ×2 IMPLANT
SUT SILK 3-0 18XBRD TIE 12 (SUTURE) ×1 IMPLANT
SUT VIC AB 2-0 CT1 27 (SUTURE) ×1
SUT VIC AB 2-0 CT1 TAPERPNT 27 (SUTURE) ×1 IMPLANT
SUT VIC AB 3-0 SH 27 (SUTURE)
SUT VIC AB 3-0 SH 27X BRD (SUTURE) IMPLANT
TOWEL OR 17X26 10 PK STRL BLUE (TOWEL DISPOSABLE) ×2 IMPLANT
TRAY FOLEY MTR SLVR 16FR STAT (SET/KITS/TRAYS/PACK) ×2 IMPLANT
TUBE FEEDING ENTERAL 5FR 16IN (TUBING) ×2 IMPLANT
YANKAUER SUCT BULB TIP NO VENT (SUCTIONS) IMPLANT

## 2018-11-09 NOTE — ED Notes (Addendum)
Preparing for intubation

## 2018-11-09 NOTE — Progress Notes (Signed)
Patient ID: Carla Park, female   DOB: [birth year], 38 y.o.   MRN: [id number] Ct cysto with IP bladder rupture confirmed. I called her husband Alwilda Bentley at [phone].  Discussed elap with bladder repair and other indicated procedures.  Will go to OR now.
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
## 2018-11-09 NOTE — Progress Notes (Signed)
Patient ID: Carla Park, female   DOB: [birth year], 38 y.o.   MRN: [id number] ua with gross blood, discussed ct again with Dr Chilton Si.  Will do a ct cystogram to rule out bladder injury
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
## 2018-11-09 NOTE — Progress Notes (Signed)
Patient ID: Carla Park, female   DOB: [birth year], 38 y.o.   MRN: [id number]
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
## 2018-11-09 NOTE — Consult Note (Signed)
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 She has a normal mood and affect.    Assessment/Plan:  1 - Traumatic Bladder Rupture - intraperitoneal injury and undergoing exploration for multiple indications, agree bladder repair indicated as well as Rt retroperitonela exploration to help maximally r/o Rt ureteral injury. 2 surgeon emergent consent.   Sebastian Acheheodore Mendi Constable 11/09/2018, 11:12 PM
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
## 2018-11-09 NOTE — Anesthesia Procedure Notes (Signed)
Arterial Line Insertion Start/End3/24/2020 11:35 PM, 11/09/2018 11:58 PM Performed by: Sheppard Evens, CRNA  Patient location: OR. Patient sedated radial was placed Catheter size: 20 G Maximum sterile barriers used   Attempts: 3 Procedure performed without using ultrasound guided technique. Ultrasound Notes:anatomy identified, needle tip was noted to be adjacent to the nerve/plexus identified and no ultrasound evidence of intravascular and/or intraneural injection Following insertion, dressing applied. Patient tolerated the procedure well with no immediate complications.

## 2018-11-09 NOTE — ED Notes (Signed)
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 mg in dextrose 5 % 125 mL (4 mg/mL) infusion (has no administration in time range)  0.9 %  sodium chloride infusion (has no administration in time range)  potassium chloride 10 mEq in 100 mL IVPB (has no administration in time range)  levETIRAcetam (KEPPRA) IVPB 500 mg/100 mL premix (has no administration in time range)  etomidate (AMIDATE) injection (10 mg Intravenous Given 11/09/18 1718)  succinylcholine (ANECTINE) injection (120 mg Intravenous Given 11/09/18 1719)  rocuronium (ZEMURON) injection (100 mg Intravenous Given 11/09/18 1726)  iohexol (OMNIPAQUE) 300 MG/ML solution 100 mL (100 mLs Intravenous Contrast Given 11/09/18 1744)  propofol (DIPRIVAN) 10 mg/mL bolus/IV push (273 mcg Intravenous Given 11/09/18 1738)    Mobility walks     Focused Assessments NO FAMILY MEMBER PRESENT   R Recommendations: See Admitting Provider Note  Report given to:   Additional Notes:  ETT/OGT/ TRIPLE LUMEN CVC / A- LINE / FOLEY CATHETER.
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
## 2018-11-09 NOTE — Progress Notes (Signed)
eLink Physician-Brief Progress Note Patient Name: Carla Park DOB: 1981-05-02 MRN: [id number]   Date of Service  11/09/2018  HPI/Events of Note  Respiratory alkalosis  eICU Interventions  Reduced respiratory rate to 16        [place]wo U Ogan 11/09/2018, 10:00 PM
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
## 2018-11-09 NOTE — ED Notes (Signed)
-  7 1/2 inch-24 @the  lip. OG to be placed.

## 2018-11-10 ENCOUNTER — Inpatient Hospital Stay (HOSPITAL_COMMUNITY): Payer: BLUE CROSS/BLUE SHIELD

## 2018-11-10 ENCOUNTER — Inpatient Hospital Stay: Payer: Self-pay

## 2018-11-10 ENCOUNTER — Encounter (HOSPITAL_COMMUNITY): Payer: Self-pay | Admitting: General Surgery

## 2018-11-10 DIAGNOSIS — S3509XA Other injury of abdominal aorta, initial encounter: Secondary | ICD-10-CM

## 2018-11-10 LAB — CBC
HCT: 42.1 % (ref 36.0–46.0)
HCT: 41.9 % (ref 36.0–46.0)
Hemoglobin: 13.9 g/dL (ref 12.0–15.0)
Hemoglobin: 14.1 g/dL (ref 12.0–15.0)
MCH: 30.3 pg (ref 26.0–34.0)
MCH: 30.9 pg (ref 26.0–34.0)
MCHC: 33 g/dL (ref 30.0–36.0)
MCHC: 33.7 g/dL (ref 30.0–36.0)
MCV: 91.7 fL (ref 80.0–100.0)
MCV: 91.9 fL (ref 80.0–100.0)
Platelets: 174 10*3/uL (ref 150–400)
Platelets: 178 10*3/uL (ref 150–400)
RBC: 4.59 MIL/uL (ref 3.87–5.11)
RBC: 4.56 MIL/uL (ref 3.87–5.11)
RDW: 14.4 % (ref 11.5–15.5)
RDW: 14.6 % (ref 11.5–15.5)
WBC: 14.3 10*3/uL — ABNORMAL HIGH (ref 4.0–10.5)
WBC: 15 10*3/uL — ABNORMAL HIGH (ref 4.0–10.5)
nRBC: 0 % (ref 0.0–0.2)
nRBC: 0 % (ref 0.0–0.2)

## 2018-11-10 LAB — POCT I-STAT 7, (LYTES, BLD GAS, ICA,H+H)
Acid-base deficit: 2 mmol/L (ref 0.0–2.0)
Acid-base deficit: 6 mmol/L — ABNORMAL HIGH (ref 0.0–2.0)
Bicarbonate: 19.5 mmol/L — ABNORMAL LOW (ref 20.0–28.0)
Bicarbonate: 21.7 mmol/L (ref 20.0–28.0)
Calcium, Ion: 1 mmol/L — ABNORMAL LOW (ref 1.15–1.40)
Calcium, Ion: 1.02 mmol/L — ABNORMAL LOW (ref 1.15–1.40)
HCT: 34 % — ABNORMAL LOW (ref 36.0–46.0)
HCT: 41 % (ref 36.0–46.0)
Hemoglobin: 11.6 g/dL — ABNORMAL LOW (ref 12.0–15.0)
Hemoglobin: 13.9 g/dL (ref 12.0–15.0)
O2 Saturation: 93 %
O2 Saturation: 97 %
Patient temperature: 36.9
Patient temperature: 99.1
Potassium: 3.4 mmol/L — ABNORMAL LOW (ref 3.5–5.1)
Potassium: 4.3 mmol/L (ref 3.5–5.1)
Sodium: 140 mmol/L (ref 135–145)
Sodium: 142 mmol/L (ref 135–145)
TCO2: 21 mmol/L — ABNORMAL LOW (ref 22–32)
TCO2: 23 mmol/L (ref 22–32)
pCO2 arterial: 33.9 mmHg (ref 32.0–48.0)
pCO2 arterial: 35.8 mmHg (ref 32.0–48.0)
pH, Arterial: 7.343 — ABNORMAL LOW (ref 7.350–7.450)
pH, Arterial: 7.416 (ref 7.350–7.450)
pO2, Arterial: 72 mmHg — ABNORMAL LOW (ref 83.0–108.0)
pO2, Arterial: 94 mmHg (ref 83.0–108.0)

## 2018-11-10 LAB — BASIC METABOLIC PANEL
Anion gap: 10 (ref 5–15)
BUN: <5 mg/dL — ABNORMAL LOW (ref 6–20)
CO2: 23 mmol/L (ref 22–32)
Calcium: 7.6 mg/dL — ABNORMAL LOW (ref 8.9–10.3)
Chloride: 105 mmol/L (ref 98–111)
Creatinine, Ser: 0.93 mg/dL (ref 0.44–1.00)
GFR calc Af Amer: >60 mL/min (ref >60)
GFR calc non Af Amer: >60 mL/min (ref >60)
Glucose, Bld: 140 mg/dL — ABNORMAL HIGH (ref 70–99)
Potassium: 4.2 mmol/L (ref 3.5–5.1)
Sodium: 138 mmol/L (ref 135–145)

## 2018-11-10 LAB — TRIGLYCERIDES: Triglycerides: 255 mg/dL — ABNORMAL HIGH (ref <150)

## 2018-11-10 LAB — HIV ANTIBODY (ROUTINE TESTING W REFLEX): HIV Screen 4th Generation wRfx: NONREACTIVE

## 2018-11-10 LAB — LACTIC ACID, PLASMA: Lactic Acid, Venous: 2.1 mmol/L (ref 0.5–1.9)

## 2018-11-10 LAB — HCG, SERUM, QUALITATIVE: Preg, Serum: NEGATIVE

## 2018-11-10 LAB — BLOOD PRODUCT ORDER (VERBAL) VERIFICATION

## 2018-11-10 LAB — HEPARIN LEVEL (UNFRACTIONATED): Heparin Unfractionated: 0.15 [IU]/mL — ABNORMAL LOW (ref 0.30–0.70)

## 2018-11-10 MED ORDER — LACTATED RINGERS IV SOLN
INTRAVENOUS | Status: DC | PRN
  Administered 2018-11-09: via INTRAVENOUS

## 2018-11-10 MED ORDER — FENTANYL 2500MCG IN NS 250ML (10MCG/ML) PREMIX INFUSION
25.0000 ug/h | INTRAVENOUS | Status: DC
  Administered 2018-11-10: 50 ug/h via INTRAVENOUS
  Administered 2018-11-11: 125 ug/h via INTRAVENOUS
  Administered 2018-11-12: 175 ug/h via INTRAVENOUS
  Administered 2018-11-12: 150 ug/h via INTRAVENOUS
  Administered 2018-11-13 (×2): 200 ug/h via INTRAVENOUS
  Administered 2018-11-14: 175 ug/h via INTRAVENOUS
  Administered 2018-11-14 – 2018-11-15 (×3): 200 ug/h via INTRAVENOUS
  Administered 2018-11-16 – 2018-11-17 (×4): 300 ug/h via INTRAVENOUS
  Filled 2018-11-10 (×15): qty 250

## 2018-11-10 MED ORDER — SODIUM CHLORIDE 0.9% FLUSH: 10.0000 mL | INTRAVENOUS | Status: DC | PRN

## 2018-11-10 MED ORDER — IPRATROPIUM-ALBUTEROL 0.5-2.5 (3) MG/3ML IN SOLN
3.0000 mL | Freq: Four times a day (QID) | RESPIRATORY_TRACT | Status: DC
  Administered 2018-11-10 – 2018-11-12 (×10): 3 mL via RESPIRATORY_TRACT
  Filled 2018-11-10 (×10): qty 3

## 2018-11-10 MED ORDER — FENTANYL BOLUS VIA INFUSION
50.0000 ug | INTRAVENOUS | Status: DC | PRN
  Administered 2018-11-12 – 2018-11-13 (×7): 50 ug via INTRAVENOUS
  Filled 2018-11-10: qty 50

## 2018-11-10 MED ORDER — DEXMEDETOMIDINE HCL IN NACL 200 MCG/50ML IV SOLN
0.4000 ug/kg/h | INTRAVENOUS | Status: DC
  Administered 2018-11-10: 0.8 ug/kg/h via INTRAVENOUS
  Administered 2018-11-10: 1 ug/kg/h via INTRAVENOUS
  Administered 2018-11-10: 0.4 ug/kg/h via INTRAVENOUS
  Administered 2018-11-10: 0.7 ug/kg/h via INTRAVENOUS
  Administered 2018-11-10 – 2018-11-11 (×5): 1 ug/kg/h via INTRAVENOUS
  Administered 2018-11-11: 1.2 ug/kg/h via INTRAVENOUS
  Administered 2018-11-11 – 2018-11-12 (×3): 1 ug/kg/h via INTRAVENOUS
  Administered 2018-11-12: 0.8 ug/kg/h via INTRAVENOUS
  Administered 2018-11-12: 0.9 ug/kg/h via INTRAVENOUS
  Administered 2018-11-12: 0.8 ug/kg/h via INTRAVENOUS
  Administered 2018-11-12: 0.6 ug/kg/h via INTRAVENOUS
  Administered 2018-11-12: 0.8 ug/kg/h via INTRAVENOUS
  Filled 2018-11-10 (×12): qty 50
  Filled 2018-11-10: qty 100
  Filled 2018-11-10 (×4): qty 50

## 2018-11-10 MED ORDER — METHADONE HCL 10 MG PO TABS
70.0000 mg | ORAL_TABLET | Freq: Every day | ORAL | Status: DC
  Administered 2018-11-10 – 2018-11-16 (×7): 70 mg
  Filled 2018-11-10 (×7): qty 7

## 2018-11-10 MED ORDER — ALBUMIN HUMAN 5 % IV SOLN
25.0000 g | Freq: Once | INTRAVENOUS | Status: AC
  Administered 2018-11-10: 25 g via INTRAVENOUS
  Filled 2018-11-10: qty 500

## 2018-11-10 MED ORDER — CHLORHEXIDINE GLUCONATE CLOTH 2 % EX PADS
6.0000 | MEDICATED_PAD | Freq: Every day | CUTANEOUS | Status: DC
  Administered 2018-11-10 – 2018-11-22 (×11): 6 via TOPICAL

## 2018-11-10 MED ORDER — HEPARIN (PORCINE) 25000 UT/250ML-% IV SOLN
1250.0000 [IU]/h | INTRAVENOUS | Status: DC
  Administered 2018-11-10: 950 [IU]/h via INTRAVENOUS
  Filled 2018-11-10: qty 250

## 2018-11-10 MED ORDER — SODIUM CHLORIDE 0.9% FLUSH
10.0000 mL | Freq: Two times a day (BID) | INTRAVENOUS | Status: DC
  Administered 2018-11-10 – 2018-11-11 (×3): 10 mL
  Administered 2018-11-11: 30 mL
  Administered 2018-11-12 – 2018-11-17 (×10): 10 mL
  Administered 2018-11-17: 30 mL
  Administered 2018-11-18: 20 mL
  Administered 2018-11-18: 30 mL
  Administered 2018-11-19 – 2018-11-28 (×18): 10 mL

## 2018-11-10 MED ORDER — FENTANYL CITRATE (PF) 100 MCG/2ML IJ SOLN
50.0000 ug | Freq: Once | INTRAMUSCULAR | Status: AC
  Administered 2018-11-10: 50 ug via INTRAVENOUS

## 2018-11-10 NOTE — Progress Notes (Signed)
FiO2 dropped to 60% per ABG results.
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
## 2018-11-10 NOTE — Brief Op Note (Signed)
11/09/2018 - 11/10/2018  12:37 AM  PATIENT:  Carla Park  38 y.o. female  PRE-OPERATIVE DIAGNOSIS:  ruptured bladder  POST-OPERATIVE DIAGNOSIS:  ruptured bladder  PROCEDURE:  Procedure(s): EXPLORATORY LAPAROTOMY bladder repair and exploration (N/A)  SURGEON:  Surgeon(s) and Role:    * Emelia Loron, MD - Primary    * [name], MD  PHYSICIAN ASSISTANT:   ASSISTANTS: Kevan Rosebush MD   ANESTHESIA:   general  EBL:  during GU portions   BLOOD ADMINISTERED:none  DRAINS: JP to bulb   LOCAL MEDICATIONS USED:  NONE  SPECIMEN:  No Specimen  DISPOSITION OF SPECIMEN:  N/A  COUNTS:  YES  TOURNIQUET:  * No tourniquets in log *  DICTATION: .Other Dictation: Dictation Number  [id number]  PLAN OF CARE: Admit to inpatient   PATIENT DISPOSITION:  ICU - intubated and critically ill.   Delay start of Pharmacological VTE agent (>24hrs) due to surgical blood loss or risk of bleeding: not applicable

## 2018-11-10 NOTE — Anesthesia Postprocedure Evaluation (Signed)
Anesthesia Post Note  Patient: Carla Park  Procedure(s) Performed: EXPLORATORY LAPAROTOMY with Cystotomy repair and exploration (N/A )     Patient location during evaluation: SICU Anesthesia Type: General Level of consciousness: sedated Pain management: pain level controlled Vital Signs Assessment: post-procedure vital signs reviewed and stable Respiratory status: patient remains intubated per anesthesia plan Cardiovascular status: stable Postop Assessment: no apparent nausea or vomiting Anesthetic complications: no    Last Vitals:  Vitals:   11/10/18 0400 11/10/18 0500  BP: (!) 140/102 (!) 144/104  Pulse: 96 100  Resp: 18 19  Temp: 36.8 C   SpO2: 100% 100%    Last Pain:  Vitals:   11/10/18 0400  TempSrc: Axillary                 Ryan P Ellender

## 2018-11-10 NOTE — Progress Notes (Addendum)
ANTICOAGULATION CONSULT NOTE - Follow Up Consult  Pharmacy Consult for heparin Indication: suspected thrombus in descending thoracic aorta  Labs: Recent Labs    11/09/18 1734  11/09/18 2309 11/09/18 2358 11/10/18 0415 11/10/18 0614 11/10/18 2200  HGB 16.8*   < > 13.9 11.6* 13.9 14.1  --   HCT 50.5*   < > 42.1 34.0* 41.0 41.9  --   PLT 205  --  174  --   --  178  --   LABPROT 13.5  --   --   --   --   --   --   INR 1.0  --   --   --   --   --   --   HEPARINUNFRC  --   --   --   --   --   --  0.15*  CREATININE 1.27*  --   --   --   --  0.93  --    < > = values in this interval not displayed.    Assessment: 37yo female subtherapeutic on heparin with initial dosing for suspected thrombus in descending thoracic aorta; no gtt issues, signs of bleeding, or change in neuro exam per RN.  Goal of Therapy:  Heparin level 0.3-0.5 units/ml   Plan:  Will increase heparin gtt by 2 units/kg/hr to 1100 units/hr and check level in 6 hours.    Vernard Gambles, PharmD, BCPS  11/10/2018,11:28 PM   Addendum: Heparin level now lower despite rate increase; no heparin-related issues overnight.  Will increase heparin gtt by 2 units/kg/hr to 1250 units/hr and check level in 6 hours.  VB 11/11/2018 6:55 AM

## 2018-11-10 NOTE — Transfer of Care (Signed)
Immediate Anesthesia Transfer of Care Note  Patient: Carla Park  Procedure(s) Performed: EXPLORATORY LAPAROTOMY with Cystotomy repair and exploration (N/A )  Patient Location: ICU  Anesthesia Type:General  Level of Consciousness: Patient remains intubated per anesthesia plan  Airway & Oxygen Therapy: Patient remains intubated per anesthesia plan and Patient placed on Ventilator (see vital sign flow sheet for setting)  Post-op Assessment: Report given to RN and Post -op Vital signs reviewed and stable  Post vital signs: Reviewed and stable  Last Vitals:  Vitals Value Taken Time  BP    Temp    Pulse 88 11/10/2018  1:01 AM  Resp 16 11/10/2018  1:01 AM  SpO2 100 % 11/10/2018  1:01 AM    Last Pain:  Vitals:   11/09/18 2100  TempSrc: Axillary         Complications: No apparent anesthesia complications

## 2018-11-10 NOTE — Progress Notes (Signed)
Peripherally Inserted Central Catheter/Midline Placement  The IV Nurse has discussed with the patient and/or persons authorized to consent for the patient, the purpose of this procedure and the potential benefits and risks involved with this procedure.  The benefits include less needle sticks, lab draws from the catheter, and the patient may be discharged home with the catheter. Risks include, but not limited to, infection, bleeding, blood clot (thrombus formation), and puncture of an artery; nerve damage and irregular heartbeat and possibility to perform a PICC exchange if needed/ordered by physician.  Alternatives to this procedure were also discussed.  Bard Power PICC patient education guide, fact sheet on infection prevention and patient information card has been provided to patient /or left at bedside.    PICC/Midline Placement Documentation  PICC Triple Lumen 11/10/18 PICC Right Brachial 36 cm 0 cm (Active)  Indication for Insertion or Continuance of Line Vasoactive infusions 11/10/2018  1:58 PM  Exposed Catheter (cm) 0 cm 11/10/2018  1:58 PM  Site Assessment Clean;Dry;Intact 11/10/2018  1:58 PM  Lumen #1 Status Flushed;Blood return noted;Saline locked 11/10/2018  1:58 PM  Lumen #2 Status Flushed;Blood return noted;Saline locked 11/10/2018  1:58 PM  Lumen #3 Status Flushed;Blood return noted;Saline locked 11/10/2018  1:58 PM  Dressing Type Transparent;Securing device 11/10/2018  1:58 PM  Dressing Status Clean;Dry;Intact;Antimicrobial disc in place 11/10/2018  1:58 PM  Dressing Change Due 11/17/18 11/10/2018  1:58 PM       Romie Jumper 11/10/2018, 2:02 PM

## 2018-11-10 NOTE — Progress Notes (Signed)
Patient transported on vent from 4N-26 to CT and back without complication.
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
## 2018-11-10 NOTE — Progress Notes (Signed)
Discussed with Dr. Conchita Paris with [facility] regarding heparin gtt.  Small head bleed, head CT unchanged.  Will follow neurologic exam and plan for heparin gtt with no bolus high risk drip given suspected thrombus in descending thoracic aorta.  Benefits likely exceed risks.  Cephus Shelling, MD [facility] Office: [phone] Pager: [phone]  Cephus Shelling
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
## 2018-11-10 NOTE — Progress Notes (Signed)
Patient ID: Carla Park, female   DOB: 1980/12/02, 38 y.o.   MRN: [id number] I spoke with her husband by phone and he reported she takes Methadone 170mg  daily via [facility]. I updated him on the plan of care.  [name], MD, MPH, FACS Trauma: [phone] General Surgery: [phone]
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
## 2018-11-10 NOTE — Progress Notes (Signed)
PT transported from 4N-26 to CT and back, with no complications.

## 2018-11-11 ENCOUNTER — Inpatient Hospital Stay (HOSPITAL_COMMUNITY): Payer: BLUE CROSS/BLUE SHIELD

## 2018-11-11 ENCOUNTER — Inpatient Hospital Stay (HOSPITAL_COMMUNITY): Payer: BLUE CROSS/BLUE SHIELD | Admitting: Certified Registered"

## 2018-11-11 ENCOUNTER — Encounter (HOSPITAL_COMMUNITY): Payer: Self-pay | Admitting: Radiology

## 2018-11-11 ENCOUNTER — Encounter (HOSPITAL_COMMUNITY): Admission: EM | Disposition: A | Discharge status: Home Health | Payer: Self-pay | Source: Home / Self Care

## 2018-11-11 DIAGNOSIS — S32412A Displaced fracture of anterior wall of left acetabulum, initial encounter for closed fracture: Secondary | ICD-10-CM

## 2018-11-11 DIAGNOSIS — S3509XA Other injury of abdominal aorta, initial encounter: Secondary | ICD-10-CM

## 2018-11-11 DIAGNOSIS — S3282XA Multiple fractures of pelvis without disruption of pelvic ring, initial encounter for closed fracture: Secondary | ICD-10-CM | POA: Diagnosis present

## 2018-11-11 HISTORY — DX: Displaced fracture of anterior wall of left acetabulum, initial encounter for closed fracture: S32.412A

## 2018-11-11 HISTORY — PX: THORACIC AORTIC ENDOVASCULAR STENT GRAFT: SHX6112

## 2018-11-11 LAB — CBC
HCT: 29.5 % — ABNORMAL LOW (ref 36.0–46.0)
Hemoglobin: 9.7 g/dL — ABNORMAL LOW (ref 12.0–15.0)
MCH: 30.4 pg (ref 26.0–34.0)
MCHC: 32.9 g/dL (ref 30.0–36.0)
MCV: 92.5 fL (ref 80.0–100.0)
Platelets: DECREASED 10*3/uL (ref 150–400)
RBC: 3.19 MIL/uL — ABNORMAL LOW (ref 3.87–5.11)
RDW: 14.5 % (ref 11.5–15.5)
WBC: 5.4 10*3/uL (ref 4.0–10.5)
nRBC: 0 % (ref 0.0–0.2)

## 2018-11-11 LAB — BASIC METABOLIC PANEL
Anion gap: 8 (ref 5–15)
BUN: 9 mg/dL (ref 6–20)
CO2: 22 mmol/L (ref 22–32)
Calcium: 6.9 mg/dL — ABNORMAL LOW (ref 8.9–10.3)
Chloride: 107 mmol/L (ref 98–111)
Creatinine, Ser: 0.8 mg/dL (ref 0.44–1.00)
GFR calc Af Amer: >60 mL/min (ref >60)
GFR calc non Af Amer: >60 mL/min (ref >60)
Glucose, Bld: 100 mg/dL — ABNORMAL HIGH (ref 70–99)
Potassium: 3.6 mmol/L (ref 3.5–5.1)
Sodium: 137 mmol/L (ref 135–145)

## 2018-11-11 LAB — HEPARIN LEVEL (UNFRACTIONATED): Heparin Unfractionated: 0.11 [IU]/mL — ABNORMAL LOW (ref 0.30–0.70)

## 2018-11-11 LAB — POCT ACTIVATED CLOTTING TIME
Activated Clotting Time: 147 s
Activated Clotting Time: 224 seconds

## 2018-11-11 SURGERY — INSERTION, ENDOVASCULAR STENT GRAFT, AORTA, THORACIC
Anesthesia: General

## 2018-11-11 MED ORDER — CEFAZOLIN SODIUM-DEXTROSE 2-3 GM-%(50ML) IV SOLR
INTRAVENOUS | Status: DC | PRN
  Administered 2018-11-11: 2 g via INTRAVENOUS

## 2018-11-11 MED ORDER — LIDOCAINE 2% (20 MG/ML) 5 ML SYRINGE
INTRAMUSCULAR | Status: AC
  Filled 2018-11-11: qty 5

## 2018-11-11 MED ORDER — LACTATED RINGERS IV SOLN
INTRAVENOUS | Status: DC | PRN
  Administered 2018-11-11: 12:00:00 via INTRAVENOUS

## 2018-11-11 MED ORDER — PROPOFOL 10 MG/ML IV BOLUS
INTRAVENOUS | Status: AC
  Filled 2018-11-11: qty 20

## 2018-11-11 MED ORDER — HEPARIN SODIUM (PORCINE) 1000 UNIT/ML IJ SOLN
INTRAMUSCULAR | Status: DC | PRN
  Administered 2018-11-11: 2000 [IU] via INTRAVENOUS
  Administered 2018-11-11: 5000 [IU] via INTRAVENOUS

## 2018-11-11 MED ORDER — EPHEDRINE 5 MG/ML INJ
INTRAVENOUS | Status: AC
  Filled 2018-11-11: qty 10

## 2018-11-11 MED ORDER — SUCCINYLCHOLINE CHLORIDE 200 MG/10ML IV SOSY
PREFILLED_SYRINGE | INTRAVENOUS | Status: AC
  Filled 2018-11-11: qty 10

## 2018-11-11 MED ORDER — VECURONIUM BROMIDE 10 MG IV SOLR
INTRAVENOUS | Status: AC
  Filled 2018-11-11: qty 10

## 2018-11-11 MED ORDER — ROCURONIUM BROMIDE 50 MG/5ML IV SOSY
PREFILLED_SYRINGE | INTRAVENOUS | Status: AC
  Filled 2018-11-11: qty 5

## 2018-11-11 MED ORDER — DEXMEDETOMIDINE HCL IN NACL 200 MCG/50ML IV SOLN
INTRAVENOUS | Status: AC
  Filled 2018-11-11: qty 50

## 2018-11-11 MED ORDER — ALBUMIN HUMAN 5 % IV SOLN
25.0000 g | Freq: Once | INTRAVENOUS | Status: AC
  Administered 2018-11-11: 25 g via INTRAVENOUS
  Filled 2018-11-11: qty 250

## 2018-11-11 MED ORDER — ROCURONIUM BROMIDE 50 MG/5ML IV SOSY
PREFILLED_SYRINGE | INTRAVENOUS | Status: AC
  Filled 2018-11-11: qty 15

## 2018-11-11 MED ORDER — IOPAMIDOL (ISOVUE-370) INJECTION 76%
INTRAVENOUS | Status: AC
  Administered 2018-11-11: 80 mL
  Filled 2018-11-11: qty 100

## 2018-11-11 MED ORDER — IODIXANOL 320 MG/ML IV SOLN
INTRAVENOUS | Status: DC | PRN
  Administered 2018-11-11: 85.7 mL via INTRAVENOUS

## 2018-11-11 MED ORDER — 0.9 % SODIUM CHLORIDE (POUR BTL) OPTIME
TOPICAL | Status: DC | PRN
  Administered 2018-11-11: 1000 mL

## 2018-11-11 MED ORDER — ONDANSETRON HCL 4 MG/2ML IJ SOLN
INTRAMUSCULAR | Status: AC
  Filled 2018-11-11: qty 2

## 2018-11-11 MED ORDER — HEPARIN SODIUM (PORCINE) 1000 UNIT/ML IJ SOLN
INTRAMUSCULAR | Status: AC
  Filled 2018-11-11: qty 2

## 2018-11-11 MED ORDER — SODIUM CHLORIDE 0.9 % IV SOLN
INTRAVENOUS | Status: DC
  Administered 2018-11-11: 11:00:00 via INTRAVENOUS
  Filled 2018-11-11 (×2): qty 1000

## 2018-11-11 MED ORDER — ROCURONIUM BROMIDE 10 MG/ML (PF) SYRINGE
PREFILLED_SYRINGE | INTRAVENOUS | Status: DC | PRN
  Administered 2018-11-11 (×3): 50 mg via INTRAVENOUS

## 2018-11-11 MED ORDER — VECURONIUM BROMIDE 10 MG IV SOLR
10.0000 mg | Freq: Once | INTRAVENOUS | Status: AC
  Administered 2018-11-11: 10 mg via INTRAVENOUS

## 2018-11-11 MED ORDER — DEXAMETHASONE SODIUM PHOSPHATE 10 MG/ML IJ SOLN
INTRAMUSCULAR | Status: AC
  Filled 2018-11-11: qty 1

## 2018-11-11 MED ORDER — SODIUM CHLORIDE 0.9 % IV SOLN
INTRAVENOUS | Status: DC | PRN
  Administered 2018-11-11: 14:00:00

## 2018-11-11 MED ORDER — SODIUM CHLORIDE 0.9 % IV SOLN
INTRAVENOUS | Status: AC
  Filled 2018-11-11: qty 1.2

## 2018-11-11 MED ORDER — POTASSIUM CHLORIDE IN NACL 20-0.9 MEQ/L-% IV SOLN
INTRAVENOUS | Status: DC
  Administered 2018-11-11 – 2018-11-13 (×4): via INTRAVENOUS
  Filled 2018-11-11 (×4): qty 1000

## 2018-11-11 SURGICAL SUPPLY — 86 items
BAG BANDED W/RUBBER/TAPE 36X54 (MISCELLANEOUS) ×2 IMPLANT
BAG SNAP BAND KOVER 36X36 (MISCELLANEOUS) ×2 IMPLANT
BIOPATCH RED 1 DISK 7.0 (GAUZE/BANDAGES/DRESSINGS) ×2 IMPLANT
BLADE SURG 11 STRL SS (BLADE) ×2 IMPLANT
CANISTER SUCT 3000ML PPV (MISCELLANEOUS) ×2 IMPLANT
CATH ACCU-VU SIZ PIG 5F 100CM (CATHETERS) ×2 IMPLANT
CATH ANGIO 5F BER2 65CM (CATHETERS) ×2 IMPLANT
CATH BEACON 5 .035 65 KMP TIP (CATHETERS) ×2 IMPLANT
CATH OMNI FLUSH .035X70CM (CATHETERS) IMPLANT
CATH VISIONS PV .035 IVUS (CATHETERS) ×2 IMPLANT
COVER BACK TABLE 60X90IN (DRAPES) ×4 IMPLANT
COVER DOME SNAP 22 D (MISCELLANEOUS) ×2 IMPLANT
COVER MAYO STAND STRL (DRAPES) ×2 IMPLANT
COVER PROBE W GEL 5X96 (DRAPES) ×2 IMPLANT
COVER SURGICAL LIGHT HANDLE (MISCELLANEOUS) ×2 IMPLANT
COVER WAND RF STERILE (DRAPES) ×2 IMPLANT
DERMABOND ADVANCED (GAUZE/BANDAGES/DRESSINGS) ×2
DERMABOND ADVANCED .7 DNX12 (GAUZE/BANDAGES/DRESSINGS) ×2 IMPLANT
DEVICE CLOSURE PERCLS PRGLD 6F (VASCULAR PRODUCTS) ×4 IMPLANT
DEVICE TORQUE KENDALL .025-038 (MISCELLANEOUS) IMPLANT
DRAPE FEMORAL ANGIO 80X135IN (DRAPES) ×2 IMPLANT
DRESSING OPSITE X SMALL 2X3 (GAUZE/BANDAGES/DRESSINGS) ×4 IMPLANT
DRSG OPSITE POSTOP 4X12 (GAUZE/BANDAGES/DRESSINGS) ×2 IMPLANT
DRSG TEGADERM 2-3/8X2-3/4 SM (GAUZE/BANDAGES/DRESSINGS) ×2 IMPLANT
DRSG TEGADERM 4X4.75 (GAUZE/BANDAGES/DRESSINGS) ×2 IMPLANT
DRYSEAL FLEXSHEATH 18FR 33CM (SHEATH) ×1
ELECT CAUTERY BLADE 6.4 (BLADE) ×2 IMPLANT
ELECT REM PT RETURN 9FT ADLT (ELECTROSURGICAL) ×4
ELECTRODE REM PT RTRN 9FT ADLT (ELECTROSURGICAL) ×2 IMPLANT
GAUZE 4X4 16PLY RFD (DISPOSABLE) ×2 IMPLANT
GAUZE SPONGE 2X2 8PLY STRL LF (GAUZE/BANDAGES/DRESSINGS) ×2 IMPLANT
GLIDEWIRE ANGLED SS 035X260CM (WIRE) IMPLANT
GLOVE BIO SURGEON STRL SZ7.5 (GLOVE) ×4 IMPLANT
GLOVE BIOGEL PI IND STRL 6.5 (GLOVE) ×1 IMPLANT
GLOVE BIOGEL PI IND STRL 8 (GLOVE) ×1 IMPLANT
GLOVE BIOGEL PI INDICATOR 6.5 (GLOVE) ×1
GLOVE BIOGEL PI INDICATOR 8 (GLOVE) ×1
GLOVE INDICATOR 7.5 STRL GRN (GLOVE) ×2 IMPLANT
GLOVE SURG SS PI 6.5 STRL IVOR (GLOVE) ×2 IMPLANT
GOWN STRL NON-REIN LRG LVL3 (GOWN DISPOSABLE) ×2 IMPLANT
GOWN STRL REUS W/ TWL LRG LVL3 (GOWN DISPOSABLE) ×3 IMPLANT
GOWN STRL REUS W/ TWL XL LVL3 (GOWN DISPOSABLE) ×1 IMPLANT
GOWN STRL REUS W/TWL LRG LVL3 (GOWN DISPOSABLE) ×3
GOWN STRL REUS W/TWL XL LVL3 (GOWN DISPOSABLE) ×1
GUIDEWIRE ANGLED .035X150CM (WIRE) IMPLANT
KIT BASIN OR (CUSTOM PROCEDURE TRAY) ×2 IMPLANT
KIT TURNOVER KIT B (KITS) ×2 IMPLANT
NEEDLE HYPO 25GX1X1/2 BEV (NEEDLE) ×2 IMPLANT
NEEDLE PERC 18GX7CM (NEEDLE) ×4 IMPLANT
NS IRRIG 1000ML POUR BTL (IV SOLUTION) ×4 IMPLANT
PACK ENDOVASCULAR (PACKS) ×2 IMPLANT
PAD ARMBOARD 7.5X6 YLW CONV (MISCELLANEOUS) ×4 IMPLANT
PENCIL BUTTON HOLSTER BLD 10FT (ELECTRODE) ×2 IMPLANT
PERCLOSE PROGLIDE 6F (VASCULAR PRODUCTS) ×8
PROTECTION STATION PRESSURIZED (MISCELLANEOUS) ×2
SET MICROPUNCTURE 5F STIFF (MISCELLANEOUS) ×4 IMPLANT
SHEATH AVANTI 11CM 5FR (SHEATH) ×2 IMPLANT
SHEATH AVANTI 11CM 8FR (SHEATH) ×2 IMPLANT
SHEATH DRYSEAL FLEX 18FR 33CM (SHEATH) ×1 IMPLANT
SHEATH PINNACLE 5F 10CM (SHEATH) ×2 IMPLANT
SHEATH PINNACLE 8F 10CM (SHEATH) ×2 IMPLANT
SLEEVE ISOL F/PACE RF HD COVER (MISCELLANEOUS) ×4 IMPLANT
SPONGE GAUZE 2X2 STER 10/PKG (GAUZE/BANDAGES/DRESSINGS) ×2
STAPLER VISISTAT 35W (STAPLE) IMPLANT
STATION PROTECTION PRESSURIZED (MISCELLANEOUS) ×1 IMPLANT
STENT GRFT THORAC ACS 21X21X10 (Endovascular Graft) ×2 IMPLANT
STOPCOCK MORSE 400PSI 3WAY (MISCELLANEOUS) ×4 IMPLANT
SUT ETHILON 3 0 PS 1 (SUTURE) IMPLANT
SUT MNCRL AB 4-0 PS2 18 (SUTURE) ×4 IMPLANT
SUT PROLENE 5 0 C 1 24 (SUTURE) IMPLANT
SUT VIC AB 2-0 CTX 36 (SUTURE) IMPLANT
SUT VIC AB 3-0 SH 27 (SUTURE)
SUT VIC AB 3-0 SH 27X BRD (SUTURE) IMPLANT
SYR 10ML LL (SYRINGE) ×6 IMPLANT
SYR 20CC LL (SYRINGE) ×4 IMPLANT
SYR 30ML LL (SYRINGE) IMPLANT
SYR 50ML LL SCALE MARK (SYRINGE) ×2 IMPLANT
SYR CONTROL 10ML LL (SYRINGE) IMPLANT
TOWEL GREEN STERILE (TOWEL DISPOSABLE) ×4 IMPLANT
TOWEL GREEN STERILE FF (TOWEL DISPOSABLE) ×2 IMPLANT
TRAY FOLEY MTR SLVR 16FR STAT (SET/KITS/TRAYS/PACK) ×2 IMPLANT
TUBING HIGH PRESSURE 120CM (CONNECTOR) ×2 IMPLANT
TUBING INJECTOR 48 (MISCELLANEOUS) ×2 IMPLANT
WATER STERILE IRR 1000ML POUR (IV SOLUTION) IMPLANT
WIRE BENTSON .035X145CM (WIRE) ×6 IMPLANT
WIRE STIFF LUNDERQUIST 260CM (WIRE) ×2 IMPLANT

## 2018-11-11 NOTE — Procedures (Signed)
Bedside Bronchoscopy Procedure Note Carla Park [id number] 11/26/1980  Procedure: Bronchoscopy Indications: Diagnostic evaluation of the airways  Procedure Details: ET Tube Size: ET Tube secured at lip (cm): Bite block in place: No In preparation for procedure, Patient hyper-oxygenated with 100 % FiO2 Airway entered and the following bronchi were examined: RUL, RML, RLL, LUL and LLL.   Bronchoscope removed.    Evaluation BP 137/85   Pulse 88   Temp 98.1 F (36.7 C) (Axillary)   Resp (!) 27   Wt 70 kg   LMP  (LMP Unknown)   SpO2 98%  Breath Sounds:Diminished O2 sats: stable throughout Patient's Current Condition: stable Specimens:  Sent purulent fluid Complications: No apparent complications Patient did tolerate procedure well.  Bedside bronch was performed without complications. Pt was placed back on previous ventilator settings from wean trial and hyper-oxygenated with 100% during procedure. Placed pt on 8 of peep per verbal order of MD. Pt's vitals are stable at this time. RT will continue to monitor.   [name] 11/11/2018, 8:55 AM
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
## 2018-11-11 NOTE — Progress Notes (Signed)
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 irregularity of the intimal surface of the descending thoracic aorta, including a linear low-density filling defect arising from the 11 o'clock position of descending thoracic aorta at the T6 level. The degree to which the irregularity of the intimal surface at the T6, T7, T8 levels can be attributed to a true deviation from the normal configuration versus motion artifact and atherosclerosis uncertain. No noncontrast CT is available for review. Greatest diameter of the proximal descending thoracic aorta measures 2.1 cm and the greatest diameter above the hiatus measures 1.9 cm, unchanged. No increasing fluid in the periaortic region. No focal outpouching. The azygos vein is attenuated, with associated mediastinal hematoma/tissue Pulmonary arteries: No central, lobar, segmental, or proximal subsegmental filling defects. Mediastinum/Nodes: Small lymph nodes of the mediastinum, slightly increased from the comparison CT particularly at the AP window and prevascular stations. There has been rightward shift of the mediastinal structures secondary to right-sided lung volume loss. Tip of the endotracheal tube terminates in the trachea above  the carina. Gastric tube traverses the thoracic esophagus. Circumferential high density attenuation associated with the right aspect of the mediastinum, adjacent to the very distal azygos vein. Lungs/Pleura: Compared to the prior CT there is progression of right lung volume loss, with only minimal aeration preserved in the right upper lobe, right middle lobe. Complete consolidation/volume loss of the left lower lobe. There is mixed perfusion of the right lung, with regions of maintained perfusion and regions of decreased perfusion. Debris remains within the origin of the right mainstem bronchus. Bronchus intermedius is patent. Debris within the bronchi of the right lower lobe and right middle lobe. Upper Abdomen: Small gas within the upper abdomen anterior to liver surface, compatible with surgery history. Musculoskeletal: No displaced thoracic fracture identified. No sternal fracture identified. Again, rib fractures of the lateral left fifth, sixth, seventh ribs. Nondisplaced rib fractures of the anterior right second, third, fourth ribs. Review of the MIP images confirms the  above findings. IMPRESSION: Redemonstration of acute aortic injury, with no new focal outpouching, periaortic fluid, or change an aortic diameter. Persisting linear filling defect originating from the 11 o'clock position of descending thoracic aorta at the T6 level is unchanged, most likely thrombus. Study is nondiagnostic for evaluation of any possible intramural hematoma given the absence of a noncontrast phase CT. Hematoma of the right mediastinum surrounding the azygos vein, adjacent to the right proximal mainstem bronchus. Blunt azygos vein injury can not be excluded. In addition, blunt injury of the proximal right-sided airways can not be excluded. Significant right lung volume loss with rightward shift of the mediastinal structures. Debris within the right mainstem bronchus, right middle and right lower lobe bronchi, with the overall  pattern compatible with mixed aspiration pneumonitis and associated atelectasis. Given the persistence of the debris within the right mainstem bronchus, correlation with bronch ostomy may be useful. Redemonstration of nondisplaced right-sided rib fractures of the anterior second, third, fourth ribs. Redemonstration of left posterolateral rib fractures of 5, 6, 7. These results were discussed by telephone at the time of interpretation on 11/11/2018 at 8:26 am to Dr. Grandville Silos. Electronically Signed   By: Corrie Mckusick D.O.   On: 11/11/2018 08:26   [place] Ekg Site Rite  Result Date: 11/10/2018 If Site Rite image not attached, placement could not be confirmed due to current cardiac rhythm.    Assessment/Plan: S/P Procedure(s) (LRB): EXPLORATORY LAPAROTOMY with Cystotomy repair and exploration (N/A) Persisting linear filling defect originating from the 11 o'clock position of descending thoracic aorta at the T6  nondisplaced right-sided rib fractures of the anterior second, third, fourth ribs. Redemonstration of left posterolateral rib fractures of 5, 6, 7. Films reviewed with Dr Carlis Abbott- with increasing defect noted in aorta plan to proceed with stent graft placement  is planned - Dr Marisa Sprinkles has reviewed plan with patient husband.   Grace Isaac MD 11/11/2018 12:14 PM

## 2018-11-11 NOTE — Transfer of Care (Signed)
Immediate Anesthesia Transfer of Care Note  Patient: Carla Park  Procedure(s) Performed: THORACIC AORTIC ENDOVASCULAR STENT GRAFT (N/A ) INTRAVASCULAR ULTRASOUND (N/A )  Patient Location: ICU  Anesthesia Type:General  Level of Consciousness: sedated and Patient remains intubated per anesthesia plan  Airway & Oxygen Therapy: Patient remains intubated per anesthesia plan and Patient placed on Ventilator (see vital sign flow sheet for setting)  Post-op Assessment: Report given to RN and Post -op Vital signs reviewed and stable  Post vital signs: Reviewed and stable  Last Vitals:  Vitals Value Taken Time  BP 118/57 11/11/2018  3:03 PM  Temp    Pulse 77 11/11/2018  3:10 PM  Resp 16 11/11/2018  3:10 PM  SpO2 95 % 11/11/2018  3:10 PM  Vitals shown include unvalidated device data.  Last Pain:  Vitals:   11/11/18 1104  TempSrc: Axillary  PainSc:          Complications: No apparent anesthesia complications

## 2018-11-11 NOTE — Procedures (Addendum)
Bronchoscopy Procedure Note Carla Park [id number] 1980-09-21  Procedure: Bronchoscopy Indications: Remove secretions  Procedure Details Consent: Risks of procedure as well as the alternatives and risks of each were explained to the (patient/caregiver).  Consent for procedure obtained. Time Out: Verified patient identification, verified procedure, site/side was marked, verified correct patient position, special equipment/implants available, medications/allergies/relevent history reviewed, required imaging and test results available.  Performed  In preparation for procedure, patient was given 100% FiO2. Sedation: Muscle relaxants and fentanyl and propofol  Airway entered and the following bronchi were examined: RUL, RML, RLL, LUL, LLL and Bronchi.  There was a lot of purulent secretions on the R side. Lavaged until clear. No blood or bronchial injury seen. Procedures performed: lavage Bronchoscope removed.    Evaluation Hemodynamic Status: BP stable throughout; O2 sats: stable throughout Patient's Current Condition: stable Specimens:  Sent purulent fluid Complications: No apparent complications Patient did tolerate procedure well.   Liz Malady 11/11/2018

## 2018-11-11 NOTE — Progress Notes (Signed)
Received pt from OR and placed her back on full vent support.

## 2018-11-11 NOTE — Progress Notes (Signed)
Peep increased per verbal order of MD. 

## 2018-11-11 NOTE — Progress Notes (Signed)
Status post cTAG today for traumatic thoracic aortic injury in the descending thoracic aorta.  Groins are clean dry and intact with no hematoma.  Palpable dorsalis pedis pulses bilaterally.  Does not need to stay on heparin at this time since thoracic aortic injury has been covered.  Cephus Shelling, MD [facility] Office: [phone] Pager: [phone]
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
## 2018-11-11 NOTE — Anesthesia Postprocedure Evaluation (Signed)
Anesthesia Post Note  Patient: Carla Park  Procedure(s) Performed: THORACIC AORTIC ENDOVASCULAR STENT GRAFT (N/A ) INTRAVASCULAR ULTRASOUND (N/A )     Patient location during evaluation: ICU Anesthesia Type: General Level of consciousness: sedated and patient remains intubated per anesthesia plan Pain management: pain level controlled Respiratory status: patient on ventilator - see flowsheet for VS and patient remains intubated per anesthesia plan Cardiovascular status: blood pressure returned to baseline and stable Postop Assessment: no apparent nausea or vomiting Anesthetic complications: no    Last Vitals:  Vitals:   11/11/18 1730 11/11/18 1800  BP: 126/69 122/70  Pulse: 66 64  Resp: 16 16  Temp:    SpO2: 100% 100%    Last Pain:  Vitals:   11/11/18 1524  TempSrc: Axillary  PainSc:                  Deaira Leckey,E. Caroleann Casler
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
## 2018-11-11 NOTE — Progress Notes (Signed)
[facility] DAILY ICU PROGRESS NOTE                   [address]            [place] [address]          [phone]   2 Days Post-Op Procedure(s) (LRB): EXPLORATORY LAPAROTOMY with Cystotomy repair and exploration (N/A)  Total Length of Stay:  LOS: 2 days   Subjective: Patient remains of vent, sedated this am  Objective: Vital signs in last 24 hours: Temp:  [98 F (36.7 C)-99 F (37.2 C)] 98.8 F (37.1 C) (03/26 0400) Pulse Rate:  [66-106] 72 (03/26 0744) Cardiac Rhythm: Normal sinus rhythm (03/26 0400) Resp:  [0-29] 17 (03/26 0744) BP: (97-138)/(54-93) 116/65 (03/26 0744) SpO2:  [92 %-100 %] 97 % (03/26 0744) Arterial Line BP: (114-159)/(54-83) 121/62 (03/26 0700) FiO2 (%):  [40 %-50 %] 40 % (03/26 0748)  Filed Weights   11/09/18 1719  Weight: 70 kg      Intake/Output from previous day: 03/25 0701 - 03/26 0700 In: 3566.9 [I.V.:3445.8; IV Piggyback:121.1] Out: 1880 [Urine:1325; Emesis/NG output:500; Drains:55]  Intake/Output this shift: No intake/output data recorded.  Current Meds: Scheduled Meds: . chlorhexidine gluconate (MEDLINE KIT)  15 mL Mouth Rinse BID  . Chlorhexidine Gluconate Cloth  6 each Topical Daily  . ipratropium-albuterol  3 mL Nebulization Q6H  . mouth rinse  15 mL Mouth Rinse 10 times per day  . methadone  70 mg Per Tube Daily  . pantoprazole  40 mg Oral Daily   Or  . pantoprazole (PROTONIX) IV  40 mg Intravenous Daily  . sodium chloride flush  10-40 mL Intracatheter Q12H   Continuous Infusions: . sodium chloride 100 mL/hr at 11/11/18 0600  . sodium chloride    . dexmedetomidine (PRECEDEX) IV infusion 1 mcg/kg/hr (11/11/18 9326)  . fentaNYL infusion INTRAVENOUS 200 mcg/hr (11/11/18 0500)  . heparin 1,250 Units/hr (11/11/18 0658)  . labetalol (NORMODYNE) infusion    . levETIRAcetam 500 mg (11/11/18 0756)  . norepinephrine (LEVOPHED) Adult infusion    . propofol (DIPRIVAN) infusion 40 mcg/kg/min (11/11/18 0537)   PRN  Meds:.Place/Maintain arterial line **AND** sodium chloride, acetaminophen, bisacodyl, fentaNYL, hydrALAZINE, metoprolol tartrate, ondansetron **OR** ondansetron (ZOFRAN) IV, sodium chloride flush  General appearance: no distress and sedated Heart: RRR, no murmur Lungs: Diminished basilar breath sounds R>L Abdomen: Soft, rare bowel sounds Extremities: SCDs in place  Lab Results: CBC: Recent Labs    11/10/18 0614 11/11/18 0659  WBC 15.0* 5.4  HGB 14.1 9.7*  HCT 41.9 29.5*  PLT 178 PLATELET CLUMPS NOTED ON SMEAR, COUNT APPEARS DECREASED   BMET:  Recent Labs    11/10/18 0614 11/11/18 0455  NA 138 137  K 4.2 3.6  CL 105 107  CO2 23 22  GLUCOSE 140* 100*  BUN <5* 9  CREATININE 0.93 0.80  CALCIUM 7.6* 6.9*    CMET: Lab Results  Component Value Date   WBC 5.4 11/11/2018   HGB 9.7 (L) 11/11/2018   HCT 29.5 (L) 11/11/2018   PLT  11/11/2018    PLATELET CLUMPS NOTED ON SMEAR, COUNT APPEARS DECREASED   GLUCOSE 100 (H) 11/11/2018   TRIG 255 (H) 11/09/2018   ALT 90 (H) 11/09/2018   AST 222 (H) 11/09/2018   NA 137 11/11/2018   K 3.6 11/11/2018   CL 107 11/11/2018   CREATININE 0.80 11/11/2018   BUN 9 11/11/2018   CO2 22 11/11/2018   INR 1.0 11/09/2018  PT/INR:  Recent Labs    11/09/18 1734  LABPROT 13.5  INR 1.0   Radiology: No results found.   Assessment/Plan: S/P Procedure(s) (LRB): EXPLORATORY LAPAROTOMY with Cystotomy repair and exploration (N/A)   1. CV-SR in the 70's this am. 2. Pulmonary-on vent and Fio2 40%. CXR this am appears to show volume loss right hemithorax secondary to RUL and RLL atelectasis and left rib fractures. 3. 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 Previous CTA showed irregular linear filling defect is seen in midportion of descending thoracic aortic concerning for interval injury or focal thoracic dissection. CTA from earlier today done but final results pending;as briefly discussed with Dr. Carlis Abbott,  appears worse. He is going to have further discussion with Dr. Arita Miss. 4. Anemia-H and H decreased to 9.7 and 29.5 this am 5. History of opioid abuse-on Methadone   Carla Baize Liston Alba PA-C 11/11/2018 8:02 AM

## 2018-11-11 NOTE — Anesthesia Preprocedure Evaluation (Addendum)
Anesthesia Evaluation  Patient identified by MRN, date of birth, ID band Patient unresponsive  General Assessment Comment:Sedated, intubated, and ventilated  Reviewed: Allergy & Precautions, NPO status , Patient's Chart, lab work & pertinent test results, Unable to perform ROS - Chart review only  History of Anesthesia Complications Negative for: history of anesthetic complications  Airway Mallampati: Intubated       Dental   Pulmonary  Intubated in ED   + rhonchi (bilateral )        Cardiovascular + Peripheral Vascular Disease (intimal thoracic aneurysm tear: labetolol)   Rhythm:Regular Rate:Tachycardia     Neuro/Psych SAH, GCS 7 on arrival and intubated by ED MD Fracture C1, base of dens: C-collar in place    GI/Hepatic negative GI ROS, (+)     substance abuse (methadone)  alcohol use,   Endo/Other  Hypothyroidism   Renal/GU      Musculoskeletal  (+) narcotic dependentPelvic fracture   Abdominal   Peds  Hematology  (+) Blood dyscrasia (Hb 9.7), anemia ,   Anesthesia Other Findings   Reproductive/Obstetrics                            Anesthesia Physical Anesthesia Plan  ASA: IV  Anesthesia Plan: General   Post-op Pain Management:    Induction: Inhalational  PONV Risk Score and Plan: 3 and Treatment may vary due to age or medical condition  Airway Management Planned: Oral ETT  Additional Equipment:   Intra-op Plan:   Post-operative Plan: Post-operative intubation/ventilation  Informed Consent:     History available from chart only  Plan Discussed with: CRNA and Surgeon  Anesthesia Plan Comments:        Anesthesia Quick Evaluation

## 2018-11-11 NOTE — Progress Notes (Signed)
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 ribs. Review of the MIP images confirms the above findings. IMPRESSION: Redemonstration of acute aortic injury, with no new focal outpouching, periaortic fluid, or change an aortic diameter. Persisting linear filling defect originating from the 11 o'clock position of descending thoracic aorta at the T6 level is unchanged, most likely thrombus. Study is nondiagnostic for evaluation of any possible intramural hematoma given the absence of a noncontrast phase CT. Hematoma of the right mediastinum surrounding the azygos vein, adjacent to the right proximal mainstem bronchus. Blunt azygos vein injury can not be excluded. In addition, blunt injury of the proximal right-sided airways can not be excluded. Significant right lung volume loss with rightward shift of the mediastinal structures. Debris within the right mainstem bronchus, right middle and right lower lobe bronchi, with the overall pattern compatible with mixed aspiration pneumonitis and associated atelectasis. Given the persistence of the debris within the right mainstem bronchus, correlation with bronch ostomy may be useful. Redemonstration of nondisplaced right-sided rib fractures of the anterior second, third, fourth ribs. Redemonstration of left posterolateral rib fractures of 5, 6, 7. These  results were discussed by telephone at the time of interpretation on 11/11/2018 at 8:26 am to Dr. Janee Morn. Electronically Signed   By: Gilmer Mor D.O.   On: 11/11/2018 08:26   [place] Ekg Site Rite  Result Date: 11/10/2018 If Site Rite image not attached, placement could not be confirmed due to current cardiac rhythm.   Assessment/Plan:  - Continue indwelling foley catheter. Will plan to stay in place ~2 weeks due to repair - Will continue to monitor pelvic JP drain    LOS: 2 days   Lenetta Quaker 11/11/2018, 1:24 PM
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
## 2018-11-12 ENCOUNTER — Encounter (HOSPITAL_COMMUNITY): Payer: Self-pay | Admitting: Vascular Surgery

## 2018-11-12 ENCOUNTER — Inpatient Hospital Stay (HOSPITAL_COMMUNITY): Payer: BLUE CROSS/BLUE SHIELD

## 2018-11-12 DIAGNOSIS — I712 Thoracic aortic aneurysm, without rupture: Secondary | ICD-10-CM

## 2018-11-12 DIAGNOSIS — I739 Peripheral vascular disease, unspecified: Secondary | ICD-10-CM

## 2018-11-12 LAB — CBC
HCT: 25.6 % — ABNORMAL LOW (ref 36.0–46.0)
Hemoglobin: 8.4 g/dL — ABNORMAL LOW (ref 12.0–15.0)
MCH: 31.5 pg (ref 26.0–34.0)
MCHC: 32.8 g/dL (ref 30.0–36.0)
MCV: 95.9 fL (ref 80.0–100.0)
Platelets: 64 10*3/uL — ABNORMAL LOW (ref 150–400)
RBC: 2.67 MIL/uL — ABNORMAL LOW (ref 3.87–5.11)
RDW: 14.7 % (ref 11.5–15.5)
WBC: 4.6 10*3/uL (ref 4.0–10.5)
nRBC: 0 % (ref 0.0–0.2)

## 2018-11-12 LAB — BASIC METABOLIC PANEL
Anion gap: 7 (ref 5–15)
BUN: 6 mg/dL (ref 6–20)
CO2: 22 mmol/L (ref 22–32)
Calcium: 7.6 mg/dL — ABNORMAL LOW (ref 8.9–10.3)
Chloride: 109 mmol/L (ref 98–111)
Creatinine, Ser: 0.64 mg/dL (ref 0.44–1.00)
GFR calc Af Amer: >60 mL/min (ref >60)
GFR calc non Af Amer: >60 mL/min (ref >60)
Glucose, Bld: 98 mg/dL (ref 70–99)
Potassium: 4 mmol/L (ref 3.5–5.1)
Sodium: 138 mmol/L (ref 135–145)

## 2018-11-12 LAB — PHOSPHORUS: Phosphorus: 2.6 mg/dL (ref 2.5–4.6)

## 2018-11-12 LAB — GLUCOSE, CAPILLARY
Glucose-Capillary: 81 mg/dL (ref 70–99)
Glucose-Capillary: 99 mg/dL (ref 70–99)

## 2018-11-12 LAB — MAGNESIUM: Magnesium: 1.6 mg/dL — ABNORMAL LOW (ref 1.7–2.4)

## 2018-11-12 LAB — TRIGLYCERIDES: Triglycerides: 114 mg/dL (ref <150)

## 2018-11-12 MED ORDER — PRO-STAT SUGAR FREE PO LIQD
30.0000 mL | Freq: Every day | ORAL | Status: DC
  Administered 2018-11-12 – 2018-11-16 (×5): 30 mL
  Filled 2018-11-12 (×5): qty 30

## 2018-11-12 MED ORDER — PRO-STAT SUGAR FREE PO LIQD: 30.0000 mL | Freq: Two times a day (BID) | ORAL | Status: DC

## 2018-11-12 MED ORDER — PIVOT 1.5 CAL PO LIQD
1000.0000 mL | ORAL | Status: DC
  Administered 2018-11-12 – 2018-11-16 (×4): 1000 mL

## 2018-11-12 MED ORDER — QUETIAPINE FUMARATE 25 MG PO TABS
50.0000 mg | ORAL_TABLET | Freq: Two times a day (BID) | ORAL | Status: DC
  Administered 2018-11-12 – 2018-11-14 (×6): 50 mg
  Filled 2018-11-12 (×7): qty 2

## 2018-11-12 MED ORDER — CLONAZEPAM 0.5 MG PO TABS
0.5000 mg | ORAL_TABLET | Freq: Two times a day (BID) | ORAL | Status: DC
  Administered 2018-11-12 – 2018-11-14 (×6): 0.5 mg
  Filled 2018-11-12 (×6): qty 1

## 2018-11-12 MED ORDER — VITAL HIGH PROTEIN PO LIQD: 1000.0000 mL | ORAL | Status: DC

## 2018-11-12 NOTE — Progress Notes (Addendum)
[facility] DAILY ICU PROGRESS NOTE                   [address]            [place] [address]          [phone]   1 Day Post-Op Procedure(s) (LRB): THORACIC AORTIC ENDOVASCULAR STENT GRAFT (N/A) INTRAVASCULAR ULTRASOUND (N/A)  Total Length of Stay:  LOS: 3 days   Subjective: Patient remains of vent, sedated this am  Objective: Vital signs in last 24 hours: Temp:  [97.5 F (36.4 C)-98.8 F (37.1 C)] 98.6 F (37 C) (03/27 0400) Pulse Rate:  [64-85] 69 (03/27 0801) Cardiac Rhythm: Normal sinus rhythm (03/27 0400) Resp:  [16-21] 16 (03/27 0700) BP: (97-152)/(46-98) 143/73 (03/27 0700) SpO2:  [96 %-100 %] 100 % (03/27 0801) Arterial Line BP: (104-185)/(53-102) 138/100 (03/27 0700) FiO2 (%):  [40 %] 40 % (03/27 0801)  Filed Weights   11/09/18 1719  Weight: 70 kg      Intake/Output from previous day: 03/26 0701 - 03/27 0700 In: 4538 [I.V.:4005.7; IV Piggyback:532.4] Out: 2353 [Urine:1055; Emesis/NG output:425; Drains:19; Blood:200]  Intake/Output this shift: No intake/output data recorded.  Current Meds: Scheduled Meds:  chlorhexidine gluconate (MEDLINE KIT)  15 mL Mouth Rinse BID   Chlorhexidine Gluconate Cloth  6 each Topical Daily   ipratropium-albuterol  3 mL Nebulization Q6H   mouth rinse  15 mL Mouth Rinse 10 times per day   methadone  70 mg Per Tube Daily   pantoprazole  40 mg Oral Daily   Or   pantoprazole (PROTONIX) IV  40 mg Intravenous Daily   sodium chloride flush  10-40 mL Intracatheter Q12H   Continuous Infusions:  sodium chloride     0.9 % NaCl with KCl 20 mEq / L 125 mL/hr at 11/12/18 0719   dexmedetomidine (PRECEDEX) IV infusion 0.9 mcg/kg/hr (11/12/18 0700)   fentaNYL infusion INTRAVENOUS 175 mcg/hr (11/12/18 0700)   labetalol (NORMODYNE) infusion     levETIRAcetam Stopped (11/11/18 1958)   norepinephrine (LEVOPHED) Adult infusion     propofol (DIPRIVAN) infusion 25 mcg/kg/min (11/12/18 0700)   PRN  Meds:.Place/Maintain arterial line **AND** sodium chloride, acetaminophen, bisacodyl, fentaNYL, hydrALAZINE, metoprolol tartrate, ondansetron **OR** ondansetron (ZOFRAN) IV, sodium chloride flush  General appearance: no distress and sedated Heart: RRR, no murmur Lungs: Coarse breath sounds Abdomen: Soft, rare bowel sounds Extremities: SCDs in place. Groin wounds are dry and there is no hematoma. Ecchymosis bilateral thighs but L >R. Palpable DP on the right and doppler DP on the left  Lab Results: CBC: Recent Labs    11/11/18 0659 11/12/18 0542  WBC 5.4 4.6  HGB 9.7* 8.4*  HCT 29.5* 25.6*  PLT PLATELET CLUMPS NOTED ON SMEAR, COUNT APPEARS DECREASED 64*   BMET:  Recent Labs    11/11/18 0455 11/12/18 0542  NA 137 138  K 3.6 4.0  CL 107 109  CO2 22 22  GLUCOSE 100* 98  BUN 9 6  CREATININE 0.80 0.64  CALCIUM 6.9* 7.6*    CMET: Lab Results  Component Value Date   WBC 4.6 11/12/2018   HGB 8.4 (L) 11/12/2018   HCT 25.6 (L) 11/12/2018   PLT 64 (L) 11/12/2018   GLUCOSE 98 11/12/2018   TRIG 255 (H) 11/09/2018   ALT 90 (H) 11/09/2018   AST 222 (H) 11/09/2018   NA 138 11/12/2018   K 4.0 11/12/2018   CL 109 11/12/2018   CREATININE 0.64 11/12/2018   BUN 6  11/12/2018   CO2 22 11/12/2018   INR 1.0 11/09/2018    PT/INR:  Recent Labs    11/09/18 1734  LABPROT 13.5  INR 1.0   Radiology: Dg Chest Port 1 View  Result Date: 11/12/2018 CLINICAL DATA:  Right-sided atelectasis EXAM: PORTABLE CHEST 1 VIEW COMPARISON:  Yesterday FINDINGS: New aortic stent graft along the descending segment. Endotracheal tube tip at the clavicular heads. The orogastric tube reaches the stomach at least. Right-sided PICC with tip at the upper cavoatrial junction. History of atelectasis with improved right lung aeration. There is still streaky and indistinct opacity at the bases. IMPRESSION: 1. Stable hardware positioning. 2. Improving right pulmonary inflation Electronically Signed   By: Monte Fantasia M.D.   On: 11/12/2018 07:23   Dg Chest Port 1 View  Result Date: 11/11/2018 CLINICAL DATA:  Status post bronchoscopy. EXAM: PORTABLE CHEST 1 VIEW COMPARISON:  Chest x-ray from same day at 6:14 a.m. FINDINGS: The patient is rotated to the right. Unchanged endotracheal and enteric tubes. Unchanged right upper extremity PICC line. Stable cardiomediastinal silhouette. Slightly improved aeration of the right lower lobe. Left lung is clear. No pleural effusion or pneumothorax. No acute osseous abnormality. IMPRESSION: 1. Slight improved aeration of the right lower lobe with residual consolidation/atelectasis. Electronically Signed   By: Titus Dubin M.D.   On: 11/11/2018 09:14     Assessment/Plan: S/P Procedure(s) (LRB): THORACIC AORTIC ENDOVASCULAR STENT GRAFT (N/A) INTRAVASCULAR ULTRASOUND (N/A)   1. CV-SR in the 70's this am. On Nor epinephrine and Labetalol drips. 2. Pulmonary-on vent and Fio2 40%. CXR this am appears to show patient rotated to the right, improved aeration on the right still with RLL atelectasis. 3. Anemia-H and H decreased to 8.4 and 25.6 this am 4. History of opioid abuse-She was on Methadone PTA 5. Thrombocytopenia-platelets 64,000 6. Management per trauma, vascular, urology, ortho  Nani Skillern PA-C 11/12/2018 8:30 AM   More alert, moving all extremities  Chest xray much improved from yesterday  abi left .57/.66  I have seen and examined Carla Park and agree with the above assessment  and plan.  Grace Isaac MD [facility] [phone] Office [phone] 11/12/2018 11:34 AM

## 2018-11-12 NOTE — Progress Notes (Signed)
Patient ID: Carla Park, female   DOB: 11-04-1980, 38 y.o.   MRN: [id number] Left leg duplex shows no evidence of occlusion in the external iliac, femoral or distal vessels.  Ankle arm index 0.6 on the left and 1.0 on the right.  No evidence of critical limb ischemia.  Continue observation
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
## 2018-11-12 NOTE — Progress Notes (Signed)
Patient ID: Carla Park, female   DOB: March 24, 1981, 38 y.o.   MRN: [id number] Weaned poorly. Quite anxious. Will start tube feeds and Klonopin/seroquel.  [name], MD, MPH, FACS Trauma: [phone] General Surgery: [phone]

## 2018-11-12 NOTE — Progress Notes (Signed)
ABI's have been completed. Preliminary results can be found in CV Proc through chart review.   11/12/18 11:26 AM Olen Cordial RVT
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
## 2018-11-13 ENCOUNTER — Inpatient Hospital Stay (HOSPITAL_COMMUNITY): Payer: BLUE CROSS/BLUE SHIELD

## 2018-11-13 LAB — BASIC METABOLIC PANEL
Anion gap: 4 — ABNORMAL LOW (ref 5–15)
Anion gap: 4 — ABNORMAL LOW (ref 5–15)
BUN: 7 mg/dL (ref 6–20)
BUN: 7 mg/dL (ref 6–20)
CO2: 20 mmol/L — ABNORMAL LOW (ref 22–32)
CO2: 22 mmol/L (ref 22–32)
Calcium: 6.6 mg/dL — ABNORMAL LOW (ref 8.9–10.3)
Calcium: 7 mg/dL — ABNORMAL LOW (ref 8.9–10.3)
Chloride: 114 mmol/L — ABNORMAL HIGH (ref 98–111)
Chloride: 111 mmol/L (ref 98–111)
Creatinine, Ser: 0.49 mg/dL (ref 0.44–1.00)
Creatinine, Ser: 0.51 mg/dL (ref 0.44–1.00)
GFR calc Af Amer: >60 mL/min (ref >60)
GFR calc Af Amer: >60 mL/min (ref >60)
GFR calc non Af Amer: >60 mL/min (ref >60)
GFR calc non Af Amer: >60 mL/min (ref >60)
Glucose, Bld: 103 mg/dL — ABNORMAL HIGH (ref 70–99)
Glucose, Bld: 114 mg/dL — ABNORMAL HIGH (ref 70–99)
Potassium: 5.5 mmol/L — ABNORMAL HIGH (ref 3.5–5.1)
Potassium: 2.9 mmol/L — ABNORMAL LOW (ref 3.5–5.1)
Sodium: 138 mmol/L (ref 135–145)
Sodium: 137 mmol/L (ref 135–145)

## 2018-11-13 LAB — CBC
HCT: 21.9 % — ABNORMAL LOW (ref 36.0–46.0)
HCT: 21.8 % — ABNORMAL LOW (ref 36.0–46.0)
HCT: 24.7 % — ABNORMAL LOW (ref 36.0–46.0)
Hemoglobin: 7.2 g/dL — ABNORMAL LOW (ref 12.0–15.0)
Hemoglobin: 7 g/dL — ABNORMAL LOW (ref 12.0–15.0)
Hemoglobin: 7.7 g/dL — ABNORMAL LOW (ref 12.0–15.0)
MCH: 32 pg (ref 26.0–34.0)
MCH: 30.6 pg (ref 26.0–34.0)
MCH: 30 pg (ref 26.0–34.0)
MCHC: 32.9 g/dL (ref 30.0–36.0)
MCHC: 32.1 g/dL (ref 30.0–36.0)
MCHC: 31.2 g/dL (ref 30.0–36.0)
MCV: 97.3 fL (ref 80.0–100.0)
MCV: 95.2 fL (ref 80.0–100.0)
MCV: 96.1 fL (ref 80.0–100.0)
Platelets: 76 10*3/uL — ABNORMAL LOW (ref 150–400)
Platelets: 82 10*3/uL — ABNORMAL LOW (ref 150–400)
Platelets: 90 10*3/uL — ABNORMAL LOW (ref 150–400)
RBC: 2.25 MIL/uL — ABNORMAL LOW (ref 3.87–5.11)
RBC: 2.29 MIL/uL — ABNORMAL LOW (ref 3.87–5.11)
RBC: 2.57 MIL/uL — ABNORMAL LOW (ref 3.87–5.11)
RDW: 14.6 % (ref 11.5–15.5)
RDW: 14.6 % (ref 11.5–15.5)
RDW: 14.4 % (ref 11.5–15.5)
WBC: 5.4 10*3/uL (ref 4.0–10.5)
WBC: 5 10*3/uL (ref 4.0–10.5)
WBC: 5.4 10*3/uL (ref 4.0–10.5)
nRBC: 0 % (ref 0.0–0.2)
nRBC: 0 % (ref 0.0–0.2)
nRBC: 0 % (ref 0.0–0.2)

## 2018-11-13 LAB — GLUCOSE, CAPILLARY
Glucose-Capillary: 107 mg/dL — ABNORMAL HIGH (ref 70–99)
Glucose-Capillary: 118 mg/dL — ABNORMAL HIGH (ref 70–99)
Glucose-Capillary: 119 mg/dL — ABNORMAL HIGH (ref 70–99)
Glucose-Capillary: 110 mg/dL — ABNORMAL HIGH (ref 70–99)
Glucose-Capillary: 96 mg/dL (ref 70–99)

## 2018-11-13 LAB — MAGNESIUM
Magnesium: 1.5 mg/dL — ABNORMAL LOW (ref 1.7–2.4)
Magnesium: 1.5 mg/dL — ABNORMAL LOW (ref 1.7–2.4)
Magnesium: 1.6 mg/dL — ABNORMAL LOW (ref 1.7–2.4)

## 2018-11-13 LAB — PHOSPHORUS
Phosphorus: 1.6 mg/dL — ABNORMAL LOW (ref 2.5–4.6)
Phosphorus: 2.5 mg/dL (ref 2.5–4.6)
Phosphorus: 2 mg/dL — ABNORMAL LOW (ref 2.5–4.6)

## 2018-11-13 MED ORDER — SODIUM CHLORIDE 0.9 % IV SOLN
INTRAVENOUS | Status: DC
  Administered 2018-11-13 – 2018-11-19 (×9): via INTRAVENOUS

## 2018-11-13 MED ORDER — VITAMIN E 100 UNT/0.25ML PO OIL
400.0000 [IU] | TOPICAL_OIL | Freq: Three times a day (TID) | ORAL | Status: DC
  Administered 2018-11-13 – 2018-11-17 (×12): 400 [IU]
  Filled 2018-11-13 (×14): qty 1

## 2018-11-13 MED ORDER — IPRATROPIUM-ALBUTEROL 0.5-2.5 (3) MG/3ML IN SOLN
3.0000 mL | Freq: Four times a day (QID) | RESPIRATORY_TRACT | Status: DC
  Administered 2018-11-13 – 2018-11-17 (×17): 3 mL via RESPIRATORY_TRACT
  Filled 2018-11-13 (×17): qty 3

## 2018-11-13 MED ORDER — VITAMIN C 500 MG PO TABS
1000.0000 mg | ORAL_TABLET | Freq: Three times a day (TID) | ORAL | Status: DC
  Administered 2018-11-13 – 2018-11-17 (×12): 1000 mg
  Filled 2018-11-13 (×12): qty 2

## 2018-11-13 MED ORDER — VITAL HIGH PROTEIN PO LIQD: 1000.0000 mL | ORAL | Status: DC

## 2018-11-13 MED ORDER — DEXMEDETOMIDINE HCL IN NACL 400 MCG/100ML IV SOLN
0.4000 ug/kg/h | INTRAVENOUS | Status: DC
  Administered 2018-11-13: 0.6 ug/kg/h via INTRAVENOUS
  Administered 2018-11-13: 0.5 ug/kg/h via INTRAVENOUS
  Administered 2018-11-14: 0.8 ug/kg/h via INTRAVENOUS
  Administered 2018-11-14: 0.6 ug/kg/h via INTRAVENOUS
  Administered 2018-11-14: 0.8 ug/kg/h via INTRAVENOUS
  Administered 2018-11-15 (×3): 1.2 ug/kg/h via INTRAVENOUS
  Administered 2018-11-15: 1 ug/kg/h via INTRAVENOUS
  Administered 2018-11-16 – 2018-11-17 (×5): 1.2 ug/kg/h via INTRAVENOUS
  Administered 2018-11-17: 0.9 ug/kg/h via INTRAVENOUS
  Administered 2018-11-17: 0.6 ug/kg/h via INTRAVENOUS
  Administered 2018-11-17 (×2): 1.2 ug/kg/h via INTRAVENOUS
  Administered 2018-11-18: 0.2 ug/kg/h via INTRAVENOUS
  Administered 2018-11-19: 0.6 ug/kg/h via INTRAVENOUS
  Filled 2018-11-13: qty 100
  Filled 2018-11-13: qty 200
  Filled 2018-11-13 (×10): qty 100
  Filled 2018-11-13: qty 200
  Filled 2018-11-13 (×2): qty 100
  Filled 2018-11-13: qty 200
  Filled 2018-11-13 (×2): qty 100

## 2018-11-13 MED ORDER — SELENIUM 200 MCG PO TABS
200.0000 ug | ORAL_TABLET | Freq: Every day | ORAL | Status: DC
  Administered 2018-11-13 – 2018-11-16 (×4): 200 ug
  Filled 2018-11-13 (×5): qty 1
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
## 2018-11-14 ENCOUNTER — Inpatient Hospital Stay (HOSPITAL_COMMUNITY): Payer: BLUE CROSS/BLUE SHIELD

## 2018-11-14 LAB — GLUCOSE, CAPILLARY
Glucose-Capillary: 101 mg/dL — ABNORMAL HIGH (ref 70–99)
Glucose-Capillary: 110 mg/dL — ABNORMAL HIGH (ref 70–99)
Glucose-Capillary: 113 mg/dL — ABNORMAL HIGH (ref 70–99)
Glucose-Capillary: 114 mg/dL — ABNORMAL HIGH (ref 70–99)
Glucose-Capillary: 114 mg/dL — ABNORMAL HIGH (ref 70–99)
Glucose-Capillary: 114 mg/dL — ABNORMAL HIGH (ref 70–99)
Glucose-Capillary: 117 mg/dL — ABNORMAL HIGH (ref 70–99)

## 2018-11-14 LAB — BASIC METABOLIC PANEL
Anion gap: 2 — ABNORMAL LOW (ref 5–15)
BUN: 8 mg/dL (ref 6–20)
CO2: 24 mmol/L (ref 22–32)
Calcium: 6.9 mg/dL — ABNORMAL LOW (ref 8.9–10.3)
Chloride: 112 mmol/L — ABNORMAL HIGH (ref 98–111)
Creatinine, Ser: 0.44 mg/dL (ref 0.44–1.00)
GFR calc Af Amer: >60 mL/min (ref >60)
GFR calc non Af Amer: >60 mL/min (ref >60)
Glucose, Bld: 107 mg/dL — ABNORMAL HIGH (ref 70–99)
Potassium: 3 mmol/L — ABNORMAL LOW (ref 3.5–5.1)
Sodium: 138 mmol/L (ref 135–145)

## 2018-11-14 LAB — CULTURE, BAL-QUANTITATIVE W GRAM STAIN
Culture: >=100000 — AB
Special Requests: NORMAL

## 2018-11-14 LAB — CBC
HCT: 24.9 % — ABNORMAL LOW (ref 36.0–46.0)
Hemoglobin: 8 g/dL — ABNORMAL LOW (ref 12.0–15.0)
MCH: 31.3 pg (ref 26.0–34.0)
MCHC: 32.1 g/dL (ref 30.0–36.0)
MCV: 97.3 fL (ref 80.0–100.0)
Platelets: 103 10*3/uL — ABNORMAL LOW (ref 150–400)
RBC: 2.56 MIL/uL — ABNORMAL LOW (ref 3.87–5.11)
RDW: 14.6 % (ref 11.5–15.5)
WBC: 5.8 10*3/uL (ref 4.0–10.5)
nRBC: 0 % (ref 0.0–0.2)

## 2018-11-14 LAB — PHOSPHORUS
Phosphorus: 2.3 mg/dL — ABNORMAL LOW (ref 2.5–4.6)
Phosphorus: 2.6 mg/dL (ref 2.5–4.6)

## 2018-11-14 LAB — MAGNESIUM
Magnesium: 2 mg/dL (ref 1.7–2.4)
Magnesium: 1.8 mg/dL (ref 1.7–2.4)

## 2018-11-14 MED ORDER — ACETAMINOPHEN 160 MG/5ML PO SOLN
650.0000 mg | ORAL | Status: DC | PRN
  Administered 2018-11-15 – 2018-11-16 (×2): 650 mg
  Filled 2018-11-14 (×2): qty 20.3

## 2018-11-14 MED ORDER — POTASSIUM CHLORIDE 10 MEQ/50ML IV SOLN
10.0000 meq | INTRAVENOUS | Status: AC
  Administered 2018-11-14 (×2): 10 meq via INTRAVENOUS
  Filled 2018-11-14 (×2): qty 50
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
## 2018-11-15 ENCOUNTER — Inpatient Hospital Stay (HOSPITAL_COMMUNITY): Payer: BLUE CROSS/BLUE SHIELD

## 2018-11-15 LAB — GLUCOSE, CAPILLARY
Glucose-Capillary: 103 mg/dL — ABNORMAL HIGH (ref 70–99)
Glucose-Capillary: 104 mg/dL — ABNORMAL HIGH (ref 70–99)
Glucose-Capillary: 106 mg/dL — ABNORMAL HIGH (ref 70–99)
Glucose-Capillary: 119 mg/dL — ABNORMAL HIGH (ref 70–99)
Glucose-Capillary: 125 mg/dL — ABNORMAL HIGH (ref 70–99)
Glucose-Capillary: 162 mg/dL — ABNORMAL HIGH (ref 70–99)

## 2018-11-15 LAB — CBC
HCT: 24.5 % — ABNORMAL LOW (ref 36.0–46.0)
Hemoglobin: 7.4 g/dL — ABNORMAL LOW (ref 12.0–15.0)
MCH: 29.5 pg (ref 26.0–34.0)
MCHC: 30.2 g/dL (ref 30.0–36.0)
MCV: 97.6 fL (ref 80.0–100.0)
Platelets: 126 10*3/uL — ABNORMAL LOW (ref 150–400)
RBC: 2.51 MIL/uL — ABNORMAL LOW (ref 3.87–5.11)
RDW: 14.5 % (ref 11.5–15.5)
WBC: 5 10*3/uL (ref 4.0–10.5)
nRBC: 0 % (ref 0.0–0.2)

## 2018-11-15 LAB — TRIGLYCERIDES: Triglycerides: 146 mg/dL (ref <150)

## 2018-11-15 MED ORDER — SODIUM CHLORIDE 0.9 % IV SOLN: 2.0000 g | INTRAVENOUS | Status: DC

## 2018-11-15 MED ORDER — ENOXAPARIN SODIUM 40 MG/0.4ML ~~LOC~~ SOLN
40.0000 mg | SUBCUTANEOUS | Status: DC
  Administered 2018-11-15 – 2018-11-30 (×16): 40 mg via SUBCUTANEOUS
  Filled 2018-11-15 (×16): qty 0.4

## 2018-11-15 MED ORDER — CLONAZEPAM 1 MG PO TABS
1.0000 mg | ORAL_TABLET | Freq: Two times a day (BID) | ORAL | Status: DC
  Administered 2018-11-15 – 2018-11-17 (×5): 1 mg
  Filled 2018-11-15 (×5): qty 1

## 2018-11-15 MED ORDER — POTASSIUM CHLORIDE 20 MEQ PO PACK
40.0000 meq | PACK | Freq: Once | ORAL | Status: AC
  Administered 2018-11-15: 40 meq via ORAL
  Filled 2018-11-15: qty 2

## 2018-11-15 MED ORDER — QUETIAPINE FUMARATE 100 MG PO TABS
100.0000 mg | ORAL_TABLET | Freq: Two times a day (BID) | ORAL | Status: DC
  Administered 2018-11-15 – 2018-11-16 (×4): 100 mg
  Filled 2018-11-15 (×4): qty 1

## 2018-11-15 MED ORDER — SODIUM CHLORIDE 0.9 % IV SOLN
2.0000 g | Freq: Three times a day (TID) | INTRAVENOUS | Status: DC
  Administered 2018-11-15 – 2018-11-16 (×3): 2 g via INTRAVENOUS
  Filled 2018-11-15 (×5): qty 2

## 2018-11-15 MED ORDER — CALCIUM GLUCONATE-NACL 1-0.675 GM/50ML-% IV SOLN
1.0000 g | Freq: Once | INTRAVENOUS | Status: AC
  Administered 2018-11-15: 1000 mg via INTRAVENOUS
  Filled 2018-11-15: qty 50

## 2018-11-15 MED ORDER — GUAIFENESIN 100 MG/5ML PO SOLN: 5.0000 mL | ORAL | Status: DC | PRN

## 2018-11-15 MED ORDER — GUAIFENESIN 100 MG/5ML PO SOLN
15.0000 mL | Freq: Four times a day (QID) | ORAL | Status: DC
  Administered 2018-11-15 – 2018-11-17 (×8): 300 mg
  Filled 2018-11-15: qty 15
  Filled 2018-11-15 (×2): qty 45
  Filled 2018-11-15: qty 15
  Filled 2018-11-15: qty 45
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
## 2018-11-16 ENCOUNTER — Inpatient Hospital Stay (HOSPITAL_COMMUNITY): Payer: BLUE CROSS/BLUE SHIELD

## 2018-11-16 LAB — CBC
HCT: 24.9 % — ABNORMAL LOW (ref 36.0–46.0)
Hemoglobin: 7.5 g/dL — ABNORMAL LOW (ref 12.0–15.0)
MCH: 29.3 pg (ref 26.0–34.0)
MCHC: 30.1 g/dL (ref 30.0–36.0)
MCV: 97.3 fL (ref 80.0–100.0)
Platelets: 170 10*3/uL (ref 150–400)
RBC: 2.56 MIL/uL — ABNORMAL LOW (ref 3.87–5.11)
RDW: 14.7 % (ref 11.5–15.5)
WBC: 6.3 10*3/uL (ref 4.0–10.5)
nRBC: 0 % (ref 0.0–0.2)

## 2018-11-16 LAB — BASIC METABOLIC PANEL
Anion gap: 8 (ref 5–15)
BUN: 16 mg/dL (ref 6–20)
CO2: 27 mmol/L (ref 22–32)
Calcium: 8.3 mg/dL — ABNORMAL LOW (ref 8.9–10.3)
Chloride: 103 mmol/L (ref 98–111)
Creatinine, Ser: 0.65 mg/dL (ref 0.44–1.00)
GFR calc Af Amer: >60 mL/min (ref >60)
GFR calc non Af Amer: >60 mL/min (ref >60)
Glucose, Bld: 115 mg/dL — ABNORMAL HIGH (ref 70–99)
Potassium: 3.8 mmol/L (ref 3.5–5.1)
Sodium: 138 mmol/L (ref 135–145)

## 2018-11-16 LAB — GLUCOSE, CAPILLARY
Glucose-Capillary: 104 mg/dL — ABNORMAL HIGH (ref 70–99)
Glucose-Capillary: 107 mg/dL — ABNORMAL HIGH (ref 70–99)
Glucose-Capillary: 115 mg/dL — ABNORMAL HIGH (ref 70–99)
Glucose-Capillary: 123 mg/dL — ABNORMAL HIGH (ref 70–99)
Glucose-Capillary: 131 mg/dL — ABNORMAL HIGH (ref 70–99)
Glucose-Capillary: 132 mg/dL — ABNORMAL HIGH (ref 70–99)

## 2018-11-16 MED ORDER — PANTOPRAZOLE SODIUM 40 MG PO PACK: 40.0000 mg | PACK | Freq: Every day | ORAL | Status: DC

## 2018-11-16 MED ORDER — SODIUM CHLORIDE 0.9 % IV SOLN
2.0000 g | INTRAVENOUS | Status: AC
  Administered 2018-11-16 – 2018-11-22 (×7): 2 g via INTRAVENOUS
  Filled 2018-11-16 (×7): qty 20

## 2018-11-16 MED ORDER — POTASSIUM CHLORIDE 20 MEQ/15ML (10%) PO SOLN
40.0000 meq | Freq: Once | ORAL | Status: AC
  Administered 2018-11-16: 40 meq
  Filled 2018-11-16: qty 30

## 2018-11-16 MED ORDER — FUROSEMIDE 10 MG/ML IJ SOLN
40.0000 mg | Freq: Once | INTRAMUSCULAR | Status: AC
  Administered 2018-11-16: 40 mg via INTRAVENOUS
  Filled 2018-11-16: qty 4
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
## 2018-11-16 NOTE — Progress Notes (Signed)
Sputum collected and sent to the lab per RRT. No complications noted.

## 2018-11-16 NOTE — Progress Notes (Addendum)
[facility]  Subjective  - intubated, spontaneously moves all 4 ext.   Objective (!) 121/57 73 99.8 F (37.7 C) (Axillary) (!) 22 99%  Intake/Output Summary (Last 24 hours) at 11/16/2018 1240 Last data filed at 11/16/2018 0454 Gross per 24 hour  Intake 3063.68 ml  Output 2830 ml  Net 233.68 ml    Lungs intubated on vent  Incisions soft without hematoma Palpable DP B LE, warm and well perfused  Assessment/Planning: 38 y.o. female is s/p:  Placement of thoracic stent graft  5 Days Post-Op  Dr. Chestine Spore has concerns for monophasic runoff on duplex and may need further evaluation in future when more stable.  Palpable pulses B LE and moving B LE spontaneously, warm to touch without ischemic changes.  Mosetta Pigeon 11/16/2018 12:40 PM --    Laboratory Lab Results: Recent Labs    11/15/18 0602 11/16/18 0435  WBC 5.0 6.3  HGB 7.4* 7.5*  HCT 24.5* 24.9*  PLT 126* 170   BMET Recent Labs    11/14/18 1521 11/16/18 0435  NA 138 138  K 3.0* 3.8  CL 112* 103  CO2 24 27  GLUCOSE 107* 115*  BUN 8 16  CREATININE 0.44 0.65  CALCIUM 6.9* 8.3*    COAG Lab Results  Component Value Date   INR 1.0 11/09/2018   No results found for: PTT   I have independently interviewed and patient and agree with PA assessment and plan above. Left foot is well perfused. Will get better evaluation when patient extubated but no indication for intervention now.   Tylee Yum C. Randie Heinz, MD [facility] Office: [phone] Pager: [phone]

## 2018-11-17 LAB — BASIC METABOLIC PANEL
Anion gap: 10 (ref 5–15)
BUN: 17 mg/dL (ref 6–20)
CO2: 28 mmol/L (ref 22–32)
Calcium: 8.2 mg/dL — ABNORMAL LOW (ref 8.9–10.3)
Chloride: 98 mmol/L (ref 98–111)
Creatinine, Ser: 0.6 mg/dL (ref 0.44–1.00)
GFR calc Af Amer: >60 mL/min (ref >60)
GFR calc non Af Amer: >60 mL/min (ref >60)
Glucose, Bld: 112 mg/dL — ABNORMAL HIGH (ref 70–99)
Potassium: 3.6 mmol/L (ref 3.5–5.1)
Sodium: 136 mmol/L (ref 135–145)

## 2018-11-17 LAB — CBC
HCT: 24.5 % — ABNORMAL LOW (ref 36.0–46.0)
Hemoglobin: 7.5 g/dL — ABNORMAL LOW (ref 12.0–15.0)
MCH: 29.5 pg (ref 26.0–34.0)
MCHC: 30.6 g/dL (ref 30.0–36.0)
MCV: 96.5 fL (ref 80.0–100.0)
Platelets: 217 10*3/uL (ref 150–400)
RBC: 2.54 MIL/uL — ABNORMAL LOW (ref 3.87–5.11)
RDW: 14.8 % (ref 11.5–15.5)
WBC: 8.8 10*3/uL (ref 4.0–10.5)
nRBC: 0 % (ref 0.0–0.2)

## 2018-11-17 LAB — GLUCOSE, CAPILLARY
Glucose-Capillary: 107 mg/dL — ABNORMAL HIGH (ref 70–99)
Glucose-Capillary: 123 mg/dL — ABNORMAL HIGH (ref 70–99)
Glucose-Capillary: 81 mg/dL (ref 70–99)
Glucose-Capillary: 81 mg/dL (ref 70–99)
Glucose-Capillary: 88 mg/dL (ref 70–99)
Glucose-Capillary: 99 mg/dL (ref 70–99)

## 2018-11-17 LAB — CULTURE, RESPIRATORY W GRAM STAIN

## 2018-11-17 LAB — CULTURE, RESPIRATORY

## 2018-11-17 MED ORDER — VITAMIN B-1 100 MG PO TABS
100.0000 mg | ORAL_TABLET | Freq: Every day | ORAL | Status: DC
  Administered 2018-11-20 – 2018-11-30 (×11): 100 mg via ORAL
  Filled 2018-11-17 (×11): qty 1

## 2018-11-17 MED ORDER — METHADONE HCL 10 MG PO TABS
70.0000 mg | ORAL_TABLET | Freq: Every day | ORAL | Status: DC
  Administered 2018-11-18 – 2018-11-30 (×13): 70 mg via ORAL
  Filled 2018-11-17 (×14): qty 7

## 2018-11-17 MED ORDER — IPRATROPIUM-ALBUTEROL 0.5-2.5 (3) MG/3ML IN SOLN
3.0000 mL | Freq: Two times a day (BID) | RESPIRATORY_TRACT | Status: DC
  Administered 2018-11-17 – 2018-11-19 (×4): 3 mL via RESPIRATORY_TRACT
  Filled 2018-11-17 (×4): qty 3

## 2018-11-17 MED ORDER — FENTANYL CITRATE (PF) 100 MCG/2ML IJ SOLN
50.0000 ug | INTRAMUSCULAR | Status: DC | PRN
  Administered 2018-11-17 (×3): 100 ug via INTRAVENOUS
  Administered 2018-11-18: 50 ug via INTRAVENOUS
  Administered 2018-11-18 – 2018-11-19 (×2): 100 ug via INTRAVENOUS
  Filled 2018-11-17 (×5): qty 2

## 2018-11-17 MED ORDER — ADULT MULTIVITAMIN W/MINERALS CH
1.0000 | ORAL_TABLET | Freq: Every day | ORAL | Status: DC
  Administered 2018-11-18 – 2018-11-30 (×13): 1 via ORAL
  Filled 2018-11-17 (×13): qty 1

## 2018-11-17 MED ORDER — OXYCODONE HCL 5 MG PO TABS
5.0000 mg | ORAL_TABLET | ORAL | Status: DC | PRN
  Administered 2018-11-17 – 2018-11-20 (×4): 10 mg via ORAL
  Administered 2018-11-21 (×2): 5 mg via ORAL
  Administered 2018-11-22 – 2018-11-29 (×20): 10 mg via ORAL
  Administered 2018-11-30: 11:00:00 5 mg via ORAL
  Filled 2018-11-17 (×17): qty 2
  Filled 2018-11-17 (×3): qty 1
  Filled 2018-11-17 (×9): qty 2

## 2018-11-17 MED ORDER — LORAZEPAM 1 MG PO TABS
1.0000 mg | ORAL_TABLET | Freq: Four times a day (QID) | ORAL | Status: AC | PRN
  Administered 2018-11-17 – 2018-11-18 (×2): 1 mg via ORAL
  Filled 2018-11-17 (×2): qty 1

## 2018-11-17 MED ORDER — FOLIC ACID 1 MG PO TABS
1.0000 mg | ORAL_TABLET | Freq: Every day | ORAL | Status: DC
  Administered 2018-11-18 – 2018-11-30 (×13): 1 mg via ORAL
  Filled 2018-11-17 (×13): qty 1

## 2018-11-17 MED ORDER — ORAL CARE MOUTH RINSE
15.0000 mL | OROMUCOSAL | Status: DC
  Administered 2018-11-17 – 2018-11-24 (×28): 15 mL via OROMUCOSAL

## 2018-11-17 MED ORDER — LORAZEPAM 2 MG/ML IJ SOLN: 1.0000 mg | Freq: Four times a day (QID) | INTRAMUSCULAR | Status: AC | PRN

## 2018-11-17 MED ORDER — THIAMINE HCL 100 MG/ML IJ SOLN
100.0000 mg | Freq: Every day | INTRAMUSCULAR | Status: DC
  Administered 2018-11-17 – 2018-11-19 (×3): 100 mg via INTRAVENOUS
  Filled 2018-11-17 (×3): qty 2

## 2018-11-17 MED ORDER — LORAZEPAM 2 MG/ML IJ SOLN
0.0000 mg | Freq: Two times a day (BID) | INTRAMUSCULAR | Status: AC
  Administered 2018-11-19: 1 mg via INTRAVENOUS
  Administered 2018-11-20 (×2): 2 mg via INTRAVENOUS
  Filled 2018-11-17 (×3): qty 1

## 2018-11-17 MED ORDER — NICOTINE 21 MG/24HR TD PT24
21.0000 mg | MEDICATED_PATCH | Freq: Every day | TRANSDERMAL | Status: DC
  Administered 2018-11-17 – 2018-11-29 (×13): 21 mg via TRANSDERMAL
  Filled 2018-11-17 (×14): qty 1

## 2018-11-17 MED ORDER — LORAZEPAM 2 MG/ML IJ SOLN: 0.0000 mg | Freq: Four times a day (QID) | INTRAMUSCULAR | Status: AC

## 2018-11-17 NOTE — Procedures (Signed)
Extubation Procedure Note  Patient Details:   Name: Carla Park DOB: [birth year] MRN: [id number]   Airway Documentation:    Vent end date: 11/17/18 Vent end time: 1005   Evaluation  O2 sats: stable throughout Complications: No apparent complications Patient did tolerate procedure well. Bilateral Breath Sounds: Diminished, Rhonchi   No   Pt was extubated and placed on 2 L [place] per order of MD. Pt's vitals are stable at this time. RT will continue to monitor.   [name] 11/17/2018, 10:10 AM
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
## 2018-11-17 NOTE — Progress Notes (Signed)
Patient given water, four hours post extubation, with immediate cough. Patient will be made NPO, with a SLP evaluation placed. Will continue to monitor. Dicie Beam RN BSN.

## 2018-11-18 ENCOUNTER — Inpatient Hospital Stay (HOSPITAL_COMMUNITY): Payer: BLUE CROSS/BLUE SHIELD

## 2018-11-18 LAB — BASIC METABOLIC PANEL
Anion gap: 9 (ref 5–15)
BUN: 14 mg/dL (ref 6–20)
CO2: 28 mmol/L (ref 22–32)
Calcium: 8.4 mg/dL — ABNORMAL LOW (ref 8.9–10.3)
Chloride: 99 mmol/L (ref 98–111)
Creatinine, Ser: 0.61 mg/dL (ref 0.44–1.00)
GFR calc Af Amer: >60 mL/min (ref >60)
GFR calc non Af Amer: >60 mL/min (ref >60)
Glucose, Bld: 87 mg/dL (ref 70–99)
Potassium: 3.4 mmol/L — ABNORMAL LOW (ref 3.5–5.1)
Sodium: 136 mmol/L (ref 135–145)

## 2018-11-18 LAB — GLUCOSE, CAPILLARY
Glucose-Capillary: 73 mg/dL (ref 70–99)
Glucose-Capillary: 79 mg/dL (ref 70–99)
Glucose-Capillary: 96 mg/dL (ref 70–99)
Glucose-Capillary: 104 mg/dL — ABNORMAL HIGH (ref 70–99)
Glucose-Capillary: 90 mg/dL (ref 70–99)
Glucose-Capillary: 100 mg/dL — ABNORMAL HIGH (ref 70–99)

## 2018-11-18 LAB — CBC
HCT: 25.7 % — ABNORMAL LOW (ref 36.0–46.0)
Hemoglobin: 8.3 g/dL — ABNORMAL LOW (ref 12.0–15.0)
MCH: 30.1 pg (ref 26.0–34.0)
MCHC: 32.3 g/dL (ref 30.0–36.0)
MCV: 93.1 fL (ref 80.0–100.0)
Platelets: 301 10*3/uL (ref 150–400)
RBC: 2.76 MIL/uL — ABNORMAL LOW (ref 3.87–5.11)
RDW: 14.4 % (ref 11.5–15.5)
WBC: 13.1 10*3/uL — ABNORMAL HIGH (ref 4.0–10.5)
nRBC: 0 % (ref 0.0–0.2)

## 2018-11-18 LAB — CULTURE, RESPIRATORY W GRAM STAIN
Gram Stain: NONE SEEN
Special Requests: NORMAL

## 2018-11-18 MED ORDER — RESOURCE THICKENUP CLEAR PO POWD
ORAL | Status: DC | PRN
  Filled 2018-11-18: qty 125

## 2018-11-18 MED ORDER — PANTOPRAZOLE SODIUM 40 MG IV SOLR
40.0000 mg | Freq: Every day | INTRAVENOUS | Status: DC
  Administered 2018-11-19: 40 mg via INTRAVENOUS
  Filled 2018-11-18: qty 40
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
## 2018-11-18 NOTE — Progress Notes (Signed)
Patient noted to have weaker cough, increased congestion. Education and demonstration for incentive spirometer given. Patient able to achieve 750, with strong cough after activity. Encouraged patient to continue to work on IS. Will continue to monitor. Dicie Beam RN BSN.

## 2018-11-18 NOTE — Progress Notes (Addendum)
Modified Barium Swallow Progress Note  Patient Details  Name: Carla Park MRN: [id number] Date of Birth: January 20, 1981  Today's Date: 11/18/2018  Modified Barium Swallow completed.  Full report located under Chart Review in the Imaging Section.  Brief recommendations include the following:  Clinical Impression  Mild-moderate oropharyngeal dysphagia marked by decreased coordination/transit with puree. Arytenoid approximation to epigottic petiole incomplete leading to penetration with thin both at vocal cord level and briefly falling below cords during swallow without adequate sensation. Cervical collar prevented head manuevers however nectar thick slowed transit through pharynx allowing time for laryngeal closure. Masticated Dys 2 texture (soft cereal bar in applesauce/barium) with functional manipulation and timely transit although decreased endurance and increased fatigue increasing as study progressed. She required moderate verbal cues for smaller sips. Recommend Dys 1 texture (energy conservation), nectar thick, FULL supervision, meds whole in applesauce if small (crush if large) and throat clear intermittently. ST will follow for safety and upgrade.        Swallow Evaluation Recommendations       SLP Diet Recommendations: Nectar thick liquid;Dysphagia 1 (Puree) solids   Liquid Administration via: Cup;Straw   Medication Administration: Crushed with puree   Supervision: Patient able to self feed;Full supervision/cueing for compensatory strategies   Compensations: Slow rate;Minimize environmental distractions;Small sips/bites;Clear throat intermittently   Postural Changes: Seated upright at 90 degrees   Oral Care Recommendations: Oral care BID        Royce Macadamia 11/18/2018,3:18 PM  Breck Coons [place].Ed Nurse, children's [phone] Office [phone]

## 2018-11-18 NOTE — Progress Notes (Signed)
Rehab Admissions Coordinator Note:  Per PT and OT recommendation, this patient was screened by Nanine Means for appropriateness for an Inpatient Acute Rehab Consult.  At this time, we are recommending Inpatient Rehab consult. AC will contact MD to request an IP Rehab Consult Order.   Nanine Means 11/18/2018, 12:47 PM  I can be reached at [phone].
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
## 2018-11-18 NOTE — Progress Notes (Signed)
Inpatient Rehab Admissions:  Inpatient Rehab Consult received.  I met with patient at the bedside for rehabilitation assessment and to discuss goals and expectations of an inpatient rehab admission.  She is sedated but gave me permission to speak to her spouse, Harrell Gave.  I spoke to him over the phone and he states they have been separated for over a year, and is unable to provide the anticipated need for 24/7 assist.  He is requesting SNF at this time.  I have contacted RNCM to let her know.  Will sign off at this time.   Signed: Shann Medal, PT, DPT Admissions Coordinator [phone] 11/18/18  3:41 PM
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
## 2018-11-19 LAB — GLUCOSE, CAPILLARY
Glucose-Capillary: 100 mg/dL — ABNORMAL HIGH (ref 70–99)
Glucose-Capillary: 78 mg/dL (ref 70–99)
Glucose-Capillary: 78 mg/dL (ref 70–99)
Glucose-Capillary: 82 mg/dL (ref 70–99)
Glucose-Capillary: 85 mg/dL (ref 70–99)
Glucose-Capillary: 96 mg/dL (ref 70–99)

## 2018-11-19 LAB — CBC
HCT: 26.5 % — ABNORMAL LOW (ref 36.0–46.0)
Hemoglobin: 8.4 g/dL — ABNORMAL LOW (ref 12.0–15.0)
MCH: 29 pg (ref 26.0–34.0)
MCHC: 31.7 g/dL (ref 30.0–36.0)
MCV: 91.4 fL (ref 80.0–100.0)
Platelets: 357 10*3/uL (ref 150–400)
RBC: 2.9 MIL/uL — ABNORMAL LOW (ref 3.87–5.11)
RDW: 14.2 % (ref 11.5–15.5)
WBC: 13.2 10*3/uL — ABNORMAL HIGH (ref 4.0–10.5)
nRBC: 0 % (ref 0.0–0.2)

## 2018-11-19 MED ORDER — LEVOTHYROXINE SODIUM 25 MCG PO TABS
25.0000 ug | ORAL_TABLET | Freq: Every day | ORAL | Status: DC
  Administered 2018-11-19 – 2018-11-30 (×12): 25 ug via ORAL
  Filled 2018-11-19 (×12): qty 1

## 2018-11-19 MED ORDER — ENSURE ENLIVE PO LIQD
237.0000 mL | Freq: Two times a day (BID) | ORAL | Status: DC
  Administered 2018-11-20 – 2018-11-30 (×12): 237 mL via ORAL

## 2018-11-19 MED ORDER — ACETAMINOPHEN 325 MG PO TABS
650.0000 mg | ORAL_TABLET | ORAL | Status: DC | PRN
  Administered 2018-11-19 – 2018-11-26 (×5): 650 mg via ORAL
  Filled 2018-11-19 (×5): qty 2

## 2018-11-19 MED ORDER — DOCUSATE SODIUM 100 MG PO CAPS
100.0000 mg | ORAL_CAPSULE | Freq: Two times a day (BID) | ORAL | Status: DC
  Administered 2018-11-19 – 2018-11-30 (×23): 100 mg via ORAL
  Filled 2018-11-19 (×23): qty 1

## 2018-11-19 MED ORDER — IPRATROPIUM-ALBUTEROL 0.5-2.5 (3) MG/3ML IN SOLN: 3.0000 mL | RESPIRATORY_TRACT | Status: DC | PRN
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
## 2018-11-19 NOTE — Progress Notes (Signed)
[facility] [place]  Subjective  - No complaints.  States both feet motor and sensory intact.   Objective (!) 153/87 72 98.6 F (37 C) (Axillary) (!) 22 99%  Intake/Output Summary (Last 24 hours) at 11/19/2018 0843 Last data filed at 11/19/2018 0800 Gross per 24 hour  Intake 1323.11 ml  Output 3375 ml  Net -2051.89 ml    Bilateral groins c/d/i, no hematoma Palpable DP pulses bilateral lower extremities    Laboratory Lab Results: Recent Labs    11/18/18 0500 11/19/18 0523  WBC 13.1* 13.2*  HGB 8.3* 8.4*  HCT 25.7* 26.5*  PLT 301 357   BMET Recent Labs    11/17/18 0500 11/18/18 0500  NA 136 136  K 3.6 3.4*  CL 98 99  CO2 28 28  GLUCOSE 112* 87  BUN 17 14  CREATININE 0.60 0.61  CALCIUM 8.2* 8.4*    COAG Lab Results  Component Value Date   INR 1.0 11/09/2018   No results found for: PTT  Assessment/Planning:  Doing well POD#8 s/p cTAG for blunt thoracic aortic injury.  Palpable DP pulses bilateral lower extremities.  Groins look good.  Will sign off.  Will arrange follow-up in one month with CTA chest to monitor thoracic graft.  Call vascular with questions or concerns.  Cephus Shelling 11/19/2018 8:43 AM --
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
## 2018-11-20 LAB — CBC
HCT: 27.4 % — ABNORMAL LOW (ref 36.0–46.0)
Hemoglobin: 9.1 g/dL — ABNORMAL LOW (ref 12.0–15.0)
MCH: 29.8 pg (ref 26.0–34.0)
MCHC: 33.2 g/dL (ref 30.0–36.0)
MCV: 89.8 fL (ref 80.0–100.0)
Platelets: 463 10*3/uL — ABNORMAL HIGH (ref 150–400)
RBC: 3.05 MIL/uL — ABNORMAL LOW (ref 3.87–5.11)
RDW: 14.2 % (ref 11.5–15.5)
WBC: 17 10*3/uL — ABNORMAL HIGH (ref 4.0–10.5)
nRBC: 0 % (ref 0.0–0.2)

## 2018-11-20 LAB — GLUCOSE, CAPILLARY
Glucose-Capillary: 127 mg/dL — ABNORMAL HIGH (ref 70–99)
Glucose-Capillary: 80 mg/dL (ref 70–99)
Glucose-Capillary: 88 mg/dL (ref 70–99)
Glucose-Capillary: 88 mg/dL (ref 70–99)
Glucose-Capillary: 89 mg/dL (ref 70–99)
Glucose-Capillary: 90 mg/dL (ref 70–99)

## 2018-11-20 LAB — BASIC METABOLIC PANEL
Anion gap: 12 (ref 5–15)
BUN: 13 mg/dL (ref 6–20)
CO2: 23 mmol/L (ref 22–32)
Calcium: 8.1 mg/dL — ABNORMAL LOW (ref 8.9–10.3)
Chloride: 100 mmol/L (ref 98–111)
Creatinine, Ser: 0.47 mg/dL (ref 0.44–1.00)
GFR calc Af Amer: >60 mL/min (ref >60)
GFR calc non Af Amer: >60 mL/min (ref >60)
Glucose, Bld: 91 mg/dL (ref 70–99)
Potassium: 2.8 mmol/L — ABNORMAL LOW (ref 3.5–5.1)
Sodium: 135 mmol/L (ref 135–145)

## 2018-11-20 MED ORDER — HYDRALAZINE HCL 20 MG/ML IJ SOLN: 10.0000 mg | Freq: Four times a day (QID) | INTRAMUSCULAR | Status: DC | PRN

## 2018-11-20 MED ORDER — PANTOPRAZOLE SODIUM 40 MG PO TBEC
40.0000 mg | DELAYED_RELEASE_TABLET | Freq: Every day | ORAL | Status: DC
  Administered 2018-11-20 – 2018-11-30 (×11): 40 mg via ORAL
  Filled 2018-11-20 (×11): qty 1

## 2018-11-20 MED ORDER — CHLORHEXIDINE GLUCONATE 0.12 % MT SOLN
OROMUCOSAL | Status: AC
  Administered 2018-11-20: 08:00:00 15 mL via OROMUCOSAL
  Filled 2018-11-20: qty 15

## 2018-11-20 MED ORDER — POTASSIUM CHLORIDE CRYS ER 20 MEQ PO TBCR
40.0000 meq | EXTENDED_RELEASE_TABLET | Freq: Two times a day (BID) | ORAL | Status: AC
  Administered 2018-11-20 (×2): 40 meq via ORAL
  Filled 2018-11-20 (×2): qty 2
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
## 2018-11-21 LAB — GLUCOSE, CAPILLARY
Glucose-Capillary: 90 mg/dL (ref 70–99)
Glucose-Capillary: 86 mg/dL (ref 70–99)
Glucose-Capillary: 89 mg/dL (ref 70–99)
Glucose-Capillary: 111 mg/dL — ABNORMAL HIGH (ref 70–99)
Glucose-Capillary: 99 mg/dL (ref 70–99)
Glucose-Capillary: 80 mg/dL (ref 70–99)

## 2018-11-21 LAB — CBC
HCT: 30.5 % — ABNORMAL LOW (ref 36.0–46.0)
Hemoglobin: 9.5 g/dL — ABNORMAL LOW (ref 12.0–15.0)
MCH: 28.7 pg (ref 26.0–34.0)
MCHC: 31.1 g/dL (ref 30.0–36.0)
MCV: 92.1 fL (ref 80.0–100.0)
Platelets: 471 10*3/uL — ABNORMAL HIGH (ref 150–400)
RBC: 3.31 MIL/uL — ABNORMAL LOW (ref 3.87–5.11)
RDW: 14.6 % (ref 11.5–15.5)
WBC: 15.8 10*3/uL — ABNORMAL HIGH (ref 4.0–10.5)
nRBC: 0 % (ref 0.0–0.2)

## 2018-11-21 LAB — BASIC METABOLIC PANEL
Anion gap: 10 (ref 5–15)
BUN: 14 mg/dL (ref 6–20)
CO2: 25 mmol/L (ref 22–32)
Calcium: 8.5 mg/dL — ABNORMAL LOW (ref 8.9–10.3)
Chloride: 99 mmol/L (ref 98–111)
Creatinine, Ser: 0.6 mg/dL (ref 0.44–1.00)
GFR calc Af Amer: >60 mL/min (ref >60)
GFR calc non Af Amer: >60 mL/min (ref >60)
Glucose, Bld: 95 mg/dL (ref 70–99)
Potassium: 3.7 mmol/L (ref 3.5–5.1)
Sodium: 134 mmol/L — ABNORMAL LOW (ref 135–145)
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
## 2018-11-22 ENCOUNTER — Inpatient Hospital Stay (HOSPITAL_COMMUNITY): Payer: BLUE CROSS/BLUE SHIELD

## 2018-11-22 LAB — GLUCOSE, CAPILLARY: Glucose-Capillary: 93 mg/dL (ref 70–99)

## 2018-11-22 MED ORDER — POLYETHYLENE GLYCOL 3350 17 G PO PACK
17.0000 g | PACK | Freq: Every day | ORAL | Status: DC | PRN
  Administered 2018-11-27: 09:00:00 17 g via ORAL
  Filled 2018-11-22 (×3): qty 1

## 2018-11-22 MED ORDER — FENTANYL CITRATE (PF) 100 MCG/2ML IJ SOLN: 50.0000 ug | INTRAMUSCULAR | Status: DC | PRN
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
## 2018-11-22 NOTE — Progress Notes (Signed)
Patient's mother Marton Redwood called CSW back from last Friday to discuss potential dc plans. Roni is in agreement with identifying a SNF that will accept patient, as she reports she has MS and is unable to care for patient at home. CSW explained potential barriers to placement regarding patient being in [facility] and potential barriers with insurance covering SNF stay. Roni reports understanding. CSW will follow up with Roni regarding bed offers. Roni can be reached at [phone].  [place], [place] [phone]

## 2018-11-22 NOTE — Progress Notes (Signed)
PT Cancellation Note  Patient Details Name: Sidny Ramadan MRN: [id number] DOB: 09-Dec-1980   Cancelled Treatment:    Reason Eval/Treat Not Completed: Patient at procedure or test/unavailable   Coleta Grosshans B Robyn Nohr 11/22/2018, 9:47 AM  Delaney Meigs, PT Acute Rehabilitation Services Pager: [phone] Office: [phone]
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
## 2018-11-23 ENCOUNTER — Other Ambulatory Visit: Payer: Self-pay

## 2018-11-23 ENCOUNTER — Inpatient Hospital Stay (HOSPITAL_COMMUNITY): Payer: BLUE CROSS/BLUE SHIELD

## 2018-11-23 ENCOUNTER — Encounter (HOSPITAL_COMMUNITY): Payer: Self-pay | Admitting: *Deleted

## 2018-11-23 LAB — GLUCOSE, CAPILLARY: Glucose-Capillary: 96 mg/dL (ref 70–99)

## 2018-11-23 MED ORDER — BOOST / RESOURCE BREEZE PO LIQD CUSTOM
1.0000 | Freq: Three times a day (TID) | ORAL | Status: DC
  Administered 2018-11-23 – 2018-11-29 (×16): 1 via ORAL

## 2018-11-23 MED ORDER — IOTHALAMATE MEGLUMINE 17.2 % UR SOLN
250.0000 mL | Freq: Once | URETHRAL | Status: AC | PRN
  Administered 2018-11-23: 200 mL via INTRAVESICAL

## 2018-11-23 MED ORDER — MAGNESIUM HYDROXIDE 400 MG/5ML PO SUSP
15.0000 mL | Freq: Once | ORAL | Status: DC
  Filled 2018-11-23 (×2): qty 30
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
## 2018-11-23 NOTE — TOC Progression Note (Signed)
Transition of Care [facility]) - Progression Note    Patient Details  Name: Carla Park MRN: [id number] Date of Birth: 1980-12-09  [facility] CM/SW Contact  Doy Hutching, [place] Phone Number: 11/23/2018, 1:45 PM  Clinical Narrative:    Upon consultation with [facility] they are unable to offer pt placement due to chronic methadone need for substance use hx. [facility] (previous bed offer) also unable to offer due to pt being on methadone.     Expected Discharge Plan: Skilled Nursing Facility Barriers to Discharge: Continued Medical Work up, SNF Pending bed offer([facility])  Expected Discharge Plan and Services Expected Discharge Plan: Skilled Nursing Facility   Discharge Planning Services: NA Post Acute Care Choice: Skilled Nursing Facility Living arrangements for the past 2 months: Single Family Home                 DME Arranged: N/A DME Agency: NA HH Arranged: NA HH Agency: NA   Social Determinants of Health (SDOH) Interventions    Readmission Risk Interventions No flowsheet data found.
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
## 2018-11-23 NOTE — Progress Notes (Signed)
PT Cancellation Note  Patient Details Name: Krishelle Sochor MRN: [id number] DOB: 10-13-1980   Cancelled Treatment:    Reason Eval/Treat Not Completed: Patient at procedure or test/unavailable; patient out of room.  Will attempt later as schedule permits.   Elray Mcgregor 11/23/2018, 1:21 PM Sheran Lawless, PT Acute Rehabilitation Services [phone] 11/23/2018
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
## 2018-11-24 MED ORDER — MAGNESIUM HYDROXIDE 400 MG/5ML PO SUSP
15.0000 mL | Freq: Once | ORAL | Status: AC
  Administered 2018-11-25: 15 mL via ORAL
  Filled 2018-11-24: qty 30

## 2018-11-24 NOTE — Progress Notes (Signed)
Pt consumed less than 25% of dinner meal and declined substitute from nutrition room stating she doesn't feel like eating. Declined neck brace stating "its to much of a pain in the ass". Will continue to monitor. Mayford Knife RN
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
## 2018-11-24 NOTE — Progress Notes (Signed)
Assumed patient's care at 1200.  Patient was not wearing C-collar at that time.  I advised patient that she should be wearing at all times and offered to assist to put the collar on.Explained the importance of the brace.  Patient declined stating she didn't want to wear it at this time and that she would put it back on herself later on.
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
## 2018-11-25 ENCOUNTER — Inpatient Hospital Stay (HOSPITAL_COMMUNITY): Payer: BLUE CROSS/BLUE SHIELD

## 2018-11-25 MED ORDER — POLYETHYLENE GLYCOL 3350 17 G PO PACK: 17.0000 g | PACK | Freq: Every day | ORAL | 0 refills | Status: DC | PRN

## 2018-11-25 MED ORDER — ADULT MULTIVITAMIN W/MINERALS CH: 1.0000 | ORAL_TABLET | Freq: Every day | ORAL | Status: DC

## 2018-11-25 MED ORDER — DOCUSATE SODIUM 100 MG PO CAPS: 100.0000 mg | ORAL_CAPSULE | Freq: Two times a day (BID) | ORAL | 0 refills | Status: DC

## 2018-11-25 MED ORDER — ACETAMINOPHEN 325 MG PO TABS: 650.0000 mg | ORAL_TABLET | ORAL | Status: DC | PRN

## 2018-11-25 MED ORDER — ENSURE ENLIVE PO LIQD: 237.0000 mL | Freq: Two times a day (BID) | ORAL | 12 refills | Status: DC

## 2018-11-25 MED ORDER — METHADONE HCL 10 MG PO TABS: 70.0000 mg | ORAL_TABLET | Freq: Every day | ORAL | 0 refills | Status: DC

## 2018-11-25 MED ORDER — ENOXAPARIN SODIUM 40 MG/0.4ML ~~LOC~~ SOLN: 40.0000 mg | SUBCUTANEOUS | 0 refills | Status: DC

## 2018-11-25 MED ORDER — OXYCODONE HCL 10 MG PO TABS: 5.0000 mg | ORAL_TABLET | Freq: Four times a day (QID) | ORAL | 0 refills | Status: DC | PRN
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
## 2018-11-25 NOTE — Care Management (Signed)
Trauma Case Mgr and CSW met with pt to discuss discharge planning.  PT now recommending [place] follow up with 24h supervision.  We discussed how pt does not meet eligibility for SNF now, and will need to dc with 24h assistance.  She states she is not sure what she will do, as she and her husband are having difficulties.  She states that she was living with spouse prior to admission, but is not sure she can return there.  Other relatives include a 40yo son and her mother, who lives in [place].  Per pt, there are no other friends or family to stay with.  She agrees to [place] and Chief Strategy Officer call and speak with her husband, Harrell Gave.    Spoke with pt's spouse by phone:  He states that he and pt have been married about 10 years, and pt has been in ETOH/drug rehab 7 times during their marriage.  He states he still cares about pt's well-being, and would be agreeable to have pt stay with him at discharge, but he cannot provide 24h care due to his work schedule.  He agrees to have home health care follow up in his home for pt if needed for therapies.  Spouse states pt has a court date at the end of April, and he is very afraid that pt may be going to jail, as she has multiple charges.  He hopes to appeal to the judge to recommend long term drug and ETOH rehab instead of jail time.  He plans to discuss this with pt and ask to get a copy of her medical records, if pt will agree to release medical records to him.  CSW states she will meet with pt on Monday to see if pt will sign medical release.    Spouse states he is off Tuesday and Wednesday, and would agree to pick pt up on Tuesday, 11/20/18,  if she is at an intermittent supervision level.  Will arrange [facility] services as therapy recommends.  Will continue to follow progress.  Spouse to call pt this evening to discuss discharge plans with her and talk to her about ultimately going to alcohol/drug rehab.    Reinaldo Raddle, RN, BSN  Trauma/Neuro ICU Case  Manager [phone]
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
## 2018-11-25 NOTE — Progress Notes (Signed)
SLP Cancellation Note  Patient Details Name: Carla Park MRN: [id number] DOB: 1981/08/01   Cancelled treatment:       Reason Eval/Treat Not Completed: Patient declined, no reason specified. Pt was approached for therapy. Her eyes were closes upon SLP's arrival and she did not open them when the SLP was present but she requested that treatment be deferred since she was tired. SLP will follow up on a subsequent date.   Mao Lockner I. Vear Clock, MS, CCC-SLP Acute Rehabilitation Services Office number [phone] Pager [phone]  Scheryl Marten 11/25/2018, 4:25 PM

## 2018-11-25 NOTE — Discharge Instructions (Signed)
°1. PAIN CONTROL:  °1. Pain is best controlled by a usual combination of three different methods TOGETHER:  °i. Ice/Heat °ii. Over the counter pain medication °iii. Prescription pain medication °2. You may experience some swelling and bruising in area of wounds. Ice packs or heating pads (30-60 minutes up to 6 times a day) will help. Use ice for the first few days to help decrease swelling and bruising, then switch to heat to help relax tight/sore spots and speed recovery. Some people prefer to use ice alone, heat alone, alternating between ice & heat. Experiment to what works for you. Swelling and bruising can take several weeks to resolve.  °3. It is helpful to take an over-the-counter pain medication regularly for the first few weeks. Choose one of the following that works best for you:  °i. Naproxen (Aleve, etc) Two 220mg tabs twice a day °ii. Ibuprofen (Advil, etc) Three 200mg tabs four times a day (every meal & bedtime) °iii. Acetaminophen (Tylenol, etc) 500-650mg four times a day (every meal & bedtime) °4. A prescription for pain medication (such as oxycodone, hydrocodone, etc) may be given to you upon discharge. Take your pain medication as prescribed.  °i. If you are having problems/concerns with the prescription medicine (does not control pain, nausea, vomiting, rash, itching, etc), please call us [phone] to see if we need to switch you to a different pain medicine that will work better for you and/or control your side effect better. °ii. If you need a refill on your pain medication, please contact your pharmacy. They will contact our office to request authorization. Prescriptions will not be filled after 5 pm or on week-ends. °1. Avoid getting constipated. When taking pain medications, it is common to experience some constipation. Increasing fluid intake and taking a fiber supplement (such as Metamucil, Citrucel, FiberCon, MiraLax, etc) 1-2 times a day regularly will usually help prevent this  problem from occurring. A mild laxative (prune juice, Milk of Magnesia, MiraLax, etc) should be taken according to package directions if there are no bowel movements after 48 hours.  °2. Watch out for diarrhea. If you have many loose bowel movements, simplify your diet to bland foods & liquids for a few days. Stop any stool softeners and decrease your fiber supplement. Switching to mild anti-diarrheal medications (Kayopectate, Pepto Bismol) can help. If this worsens or does not improve, please call us. °3. Shower daily but do not bathe until your wounds heal. Cover your wounds with clean gauze and tape after showering.  °4. FOLLOW UP  °a. If a follow up appointment is needed one will be scheduled for you. If none is needed with our trauma team, please follow up with your primary care provider within 2-3 weeks from discharge. Please call [facility] at [phone] if you have any questions about follow up.  ° °WHEN TO CALL US [phone]:  °1. Poor pain control °2. Reactions / problems with new medications (rash/itching, nausea, etc)  °3. Fever over 101.5 F (38.5 C) °4. Worsening swelling or bruising °5. Redness, swelling, foul discharge or increased pain from wounds °6. Productive cough, difficulty breathing or any other concerning symptoms ° °The clinic staff is available to answer your questions during regular business hours (8:30am-5pm). Please don’t hesitate to call and ask to speak to one of our nurses for clinical concerns.  °If you have a medical emergency, go to the nearest emergency room or call 911.  °A surgeon from [facility] is always on call at   the [facility], [place]  [address], [place], [place], [place] [address] ?  MAIN: [phone] ? TOLL FREE: [phone] ?  FAX [phone]  [email or web address]
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
## 2018-11-28 NOTE — Progress Notes (Signed)
Pt again refusing aspen collar today. RN reapplying collar with each entry into room and education pt importance of wearing brace; pt appears to have no evidence of learning. Will continue application and education.
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
## 2018-11-28 NOTE — Progress Notes (Signed)
Late Entry:  Patient refusing aspen c-collar. RN reapplied several times throughout day, pt continuously taking off collar each time. RN attempted education with patient, pt appears to have no evidence of learning. Will continue attempting to apply aspen collar with education.

## 2018-11-29 MED ORDER — POLYETHYLENE GLYCOL 3350 17 G PO PACK
17.0000 g | PACK | Freq: Every day | ORAL | Status: DC
  Administered 2018-11-29: 09:00:00 17 g via ORAL

## 2018-11-29 MED ORDER — BISACODYL 10 MG RE SUPP
10.0000 mg | Freq: Once | RECTAL | Status: AC
  Administered 2018-11-29: 09:00:00 10 mg via RECTAL
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
## 2018-11-30 MED ORDER — ENOXAPARIN SODIUM 40 MG/0.4ML ~~LOC~~ SOLN: 40.0000 mg | SUBCUTANEOUS | 0 refills | Status: DC

## 2018-11-30 MED ORDER — METHADONE HCL 10 MG PO TABS: 70.0000 mg | ORAL_TABLET | Freq: Every day | ORAL | 0 refills | Status: DC

## 2018-11-30 MED ORDER — POLYETHYLENE GLYCOL 3350 17 G PO PACK: 17.0000 g | PACK | Freq: Every day | ORAL | 0 refills | Status: DC

## 2018-11-30 MED ORDER — DOCUSATE SODIUM 100 MG PO CAPS: 100.0000 mg | ORAL_CAPSULE | Freq: Two times a day (BID) | ORAL | 0 refills | Status: DC

## 2018-11-30 MED ORDER — OXYCODONE HCL 5 MG PO TABS: 5.0000 mg | ORAL_TABLET | Freq: Four times a day (QID) | ORAL | 0 refills | Status: DC | PRN

## 2018-11-30 MED ORDER — ACETAMINOPHEN 325 MG PO TABS: 650.0000 mg | ORAL_TABLET | ORAL | Status: DC | PRN

## 2018-11-30 MED ORDER — ADULT MULTIVITAMIN W/MINERALS CH: 1.0000 | ORAL_TABLET | Freq: Every day | ORAL | Status: AC

## 2018-11-30 MED FILL — oxyCODONE HCL 5 MG TABS: 5 | 6 days supply | Qty: 25 | Fill #0

## 2018-11-30 MED FILL — ENOXAPARIN 40 MG/0.4 ML SYR: 40 | 14 days supply | Qty: 6 | Fill #0

## 2018-11-30 NOTE — Progress Notes (Signed)
Pt discharged in care of Spouse, Jheri Rourke. RN assisted pt to husband vehicle. Discharge instructions reviewed with spouse, all questions and concerns addressed.
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
## 2018-12-01 ENCOUNTER — Encounter (HOSPITAL_COMMUNITY): Payer: Self-pay

## 2019-01-29 ENCOUNTER — Other Ambulatory Visit: Payer: Self-pay

## 2019-01-29 ENCOUNTER — Emergency Department (HOSPITAL_COMMUNITY)
Admission: EM | Admit: 2019-01-29 | Discharge: 2019-01-31 | Disposition: A | Discharge status: Other Acute Inpatient Hospital | Payer: BC Managed Care – PPO | Source: Home / Self Care | Attending: Emergency Medicine | Admitting: Emergency Medicine

## 2019-01-29 ENCOUNTER — Emergency Department (HOSPITAL_COMMUNITY): Payer: BC Managed Care – PPO

## 2019-01-29 ENCOUNTER — Encounter (HOSPITAL_COMMUNITY): Payer: Self-pay

## 2019-01-29 DIAGNOSIS — Z20828 Contact with and (suspected) exposure to other viral communicable diseases: Secondary | ICD-10-CM | POA: Insufficient documentation

## 2019-01-29 DIAGNOSIS — R45851 Suicidal ideations: Secondary | ICD-10-CM | POA: Insufficient documentation

## 2019-01-29 DIAGNOSIS — I10 Essential (primary) hypertension: Secondary | ICD-10-CM | POA: Insufficient documentation

## 2019-01-29 DIAGNOSIS — F322 Major depressive disorder, single episode, severe without psychotic features: Secondary | ICD-10-CM | POA: Diagnosis not present

## 2019-01-29 DIAGNOSIS — F332 Major depressive disorder, recurrent severe without psychotic features: Secondary | ICD-10-CM | POA: Insufficient documentation

## 2019-01-29 DIAGNOSIS — R7989 Other specified abnormal findings of blood chemistry: Secondary | ICD-10-CM

## 2019-01-29 DIAGNOSIS — F1020 Alcohol dependence, uncomplicated: Secondary | ICD-10-CM | POA: Insufficient documentation

## 2019-01-29 DIAGNOSIS — Z79899 Other long term (current) drug therapy: Secondary | ICD-10-CM | POA: Insufficient documentation

## 2019-01-29 DIAGNOSIS — F17210 Nicotine dependence, cigarettes, uncomplicated: Secondary | ICD-10-CM | POA: Insufficient documentation

## 2019-01-29 DIAGNOSIS — F1010 Alcohol abuse, uncomplicated: Secondary | ICD-10-CM

## 2019-01-29 HISTORY — DX: Essential (primary) hypertension: I10

## 2019-01-29 LAB — COMPREHENSIVE METABOLIC PANEL
ALT: 117 U/L — ABNORMAL HIGH (ref 0–44)
AST: 192 U/L — ABNORMAL HIGH (ref 15–41)
Albumin: 3.4 g/dL — ABNORMAL LOW (ref 3.5–5.0)
Alkaline Phosphatase: 697 U/L — ABNORMAL HIGH (ref 38–126)
Anion gap: 15 (ref 5–15)
BUN: 5 mg/dL — ABNORMAL LOW (ref 6–20)
CO2: 25 mmol/L (ref 22–32)
Calcium: 8.9 mg/dL (ref 8.9–10.3)
Chloride: 99 mmol/L (ref 98–111)
Creatinine, Ser: 0.78 mg/dL (ref 0.44–1.00)
GFR calc Af Amer: >60 mL/min (ref >60)
GFR calc non Af Amer: >60 mL/min (ref >60)
Glucose, Bld: 105 mg/dL — ABNORMAL HIGH (ref 70–99)
Potassium: 4.1 mmol/L (ref 3.5–5.1)
Sodium: 139 mmol/L (ref 135–145)
Total Bilirubin: 0.6 mg/dL (ref 0.3–1.2)
Total Protein: 7.1 g/dL (ref 6.5–8.1)

## 2019-01-29 LAB — I-STAT BETA HCG BLOOD, ED (MC, WL, AP ONLY): I-stat hCG, quantitative: <5 m[IU]/mL (ref <5)

## 2019-01-29 LAB — RAPID URINE DRUG SCREEN, HOSP PERFORMED
Amphetamines: NOT DETECTED
Barbiturates: NOT DETECTED
Benzodiazepines: NOT DETECTED
Cocaine: NOT DETECTED
Opiates: NOT DETECTED
Tetrahydrocannabinol: NOT DETECTED

## 2019-01-29 LAB — CBC WITH DIFFERENTIAL/PLATELET
Abs Immature Granulocytes: 0.02 10*3/uL (ref 0.00–0.07)
Basophils Absolute: 0.1 10*3/uL (ref 0.0–0.1)
Basophils Relative: 1 %
Eosinophils Absolute: 0.2 10*3/uL (ref 0.0–0.5)
Eosinophils Relative: 4 %
HCT: 50.4 % — ABNORMAL HIGH (ref 36.0–46.0)
Hemoglobin: 16.7 g/dL — ABNORMAL HIGH (ref 12.0–15.0)
Immature Granulocytes: 0 %
Lymphocytes Relative: 35 %
Lymphs Abs: 2.2 10*3/uL (ref 0.7–4.0)
MCH: 31 pg (ref 26.0–34.0)
MCHC: 33.1 g/dL (ref 30.0–36.0)
MCV: 93.7 fL (ref 80.0–100.0)
Monocytes Absolute: 0.4 10*3/uL (ref 0.1–1.0)
Monocytes Relative: 6 %
Neutro Abs: 3.4 10*3/uL (ref 1.7–7.7)
Neutrophils Relative %: 54 %
Platelets: 139 10*3/uL — ABNORMAL LOW (ref 150–400)
RBC: 5.38 MIL/uL — ABNORMAL HIGH (ref 3.87–5.11)
RDW: 16.3 % — ABNORMAL HIGH (ref 11.5–15.5)
WBC: 6.2 10*3/uL (ref 4.0–10.5)
nRBC: 0 % (ref 0.0–0.2)

## 2019-01-29 LAB — ACETAMINOPHEN LEVEL: Acetaminophen (Tylenol), Serum: <10 ug/mL — ABNORMAL LOW (ref 10–30)

## 2019-01-29 LAB — SALICYLATE LEVEL: Salicylate Lvl: <7 mg/dL (ref 2.8–30.0)

## 2019-01-29 LAB — SARS CORONAVIRUS 2: SARS Coronavirus 2: NOT DETECTED

## 2019-01-29 LAB — ETHANOL: Alcohol, Ethyl (B): 286 mg/dL — ABNORMAL HIGH (ref <10)

## 2019-01-29 MED ORDER — CLONIDINE HCL 0.2 MG PO TABS
0.1000 mg | ORAL_TABLET | Freq: Three times a day (TID) | ORAL | Status: DC
  Administered 2019-01-29 – 2019-01-30 (×5): 0.1 mg via ORAL
  Filled 2019-01-29 (×5): qty 1

## 2019-01-29 MED ORDER — VITAMIN B-1 100 MG PO TABS
100.0000 mg | ORAL_TABLET | Freq: Every day | ORAL | Status: DC
  Administered 2019-01-30: 100 mg via ORAL
  Filled 2019-01-29: qty 1

## 2019-01-29 MED ORDER — LORAZEPAM 2 MG/ML IJ SOLN: 0.0000 mg | Freq: Four times a day (QID) | INTRAMUSCULAR | Status: DC

## 2019-01-29 MED ORDER — TRAZODONE HCL 100 MG PO TABS
100.0000 mg | ORAL_TABLET | Freq: Every day | ORAL | Status: DC
  Administered 2019-01-29 – 2019-01-30 (×2): 100 mg via ORAL
  Filled 2019-01-29 (×2): qty 1

## 2019-01-29 MED ORDER — LORAZEPAM 1 MG PO TABS: 0.0000 mg | ORAL_TABLET | Freq: Two times a day (BID) | ORAL | Status: DC

## 2019-01-29 MED ORDER — METHADONE HCL 10 MG PO TABS
95.0000 mg | ORAL_TABLET | Freq: Every day | ORAL | Status: DC
  Administered 2019-01-30: 95 mg via ORAL
  Filled 2019-01-29: qty 10

## 2019-01-29 MED ORDER — LORAZEPAM 2 MG/ML IJ SOLN: 0.0000 mg | Freq: Two times a day (BID) | INTRAMUSCULAR | Status: DC

## 2019-01-29 MED ORDER — ACETAMINOPHEN 325 MG PO TABS: 650.0000 mg | ORAL_TABLET | ORAL | Status: DC | PRN

## 2019-01-29 MED ORDER — ALBUTEROL SULFATE HFA 108 (90 BASE) MCG/ACT IN AERS: 1.0000 | INHALATION_SPRAY | Freq: Four times a day (QID) | RESPIRATORY_TRACT | Status: DC | PRN

## 2019-01-29 MED ORDER — DULOXETINE HCL 20 MG PO CPEP
40.0000 mg | ORAL_CAPSULE | Freq: Every day | ORAL | Status: DC
  Administered 2019-01-30: 09:00:00 40 mg via ORAL
  Filled 2019-01-29: qty 2

## 2019-01-29 MED ORDER — GABAPENTIN 300 MG PO CAPS
600.0000 mg | ORAL_CAPSULE | Freq: Three times a day (TID) | ORAL | Status: DC
  Administered 2019-01-29 – 2019-01-30 (×4): 600 mg via ORAL
  Filled 2019-01-29 (×4): qty 2

## 2019-01-29 MED ORDER — TIZANIDINE HCL 4 MG PO TABS: 2.0000 mg | ORAL_TABLET | Freq: Two times a day (BID) | ORAL | Status: DC | PRN

## 2019-01-29 MED ORDER — LORAZEPAM 1 MG PO TABS
0.0000 mg | ORAL_TABLET | Freq: Four times a day (QID) | ORAL | Status: DC
  Administered 2019-01-30: 2 mg via ORAL
  Filled 2019-01-29: qty 2

## 2019-01-29 MED ORDER — LEVOTHYROXINE SODIUM 25 MCG PO TABS
25.0000 ug | ORAL_TABLET | Freq: Every day | ORAL | Status: DC
  Administered 2019-01-30: 25 ug via ORAL
  Filled 2019-01-29: qty 1

## 2019-01-29 MED ORDER — HYDROXYZINE HCL 50 MG PO TABS
50.0000 mg | ORAL_TABLET | Freq: Four times a day (QID) | ORAL | Status: DC | PRN
  Administered 2019-01-30: 50 mg via ORAL
  Filled 2019-01-29: qty 1

## 2019-01-29 MED ORDER — THIAMINE HCL 100 MG/ML IJ SOLN: 100.0000 mg | Freq: Every day | INTRAMUSCULAR | Status: DC
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
## 2019-01-29 NOTE — ED Notes (Signed)
TTS in process 
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
## 2019-01-30 NOTE — ED Notes (Signed)
Patient denies pain and is resting comfortably.  

## 2019-01-30 NOTE — ED Notes (Signed)
Pt alert and oriented. Calm.cooperative. flat. Guarded. Denied SI/HI. Denied A/V halluciantions

## 2019-01-30 NOTE — Progress Notes (Signed)
Per Doree Albee, pt has been accepted to [facility] bed 302-2. Accepting provider is Marvia Pickles, NP. Attending provider is Dr. Parke Poisson, MD. Patient can arrive by 22:00. Number for report is [phone]. CSW spoke with RN regarding disposition.   Chalmers Guest. Guerry Bruin, MSW, [place] Work/Disposition Phone: [phone] Fax: [phone]

## 2019-01-30 NOTE — ED Notes (Signed)
Called pt Group Home for transportation. I left a message. The Group Home had stated they would be here in 30 minutes, it has been over an hour.

## 2019-01-30 NOTE — ED Provider Notes (Signed)
Pt presenting for etoh abuse and SI. Pending placement.   Labs, vitals, and RN notes reviewed.     Franchot Heidelberg, PA-C 01/30/19 [phone]    Pattricia Boss, MD 01/30/19 [phone]

## 2019-01-30 NOTE — ED Notes (Addendum)
Wilmon Arms RN gave pt  the ordered dose of methadone,  95 mg (9.5  Tabs) . The methadone was securely tubed to RN. The half pill was wasted with pharmacyBryson Ha) placed in black bin by pxyis

## 2019-01-31 ENCOUNTER — Inpatient Hospital Stay (HOSPITAL_COMMUNITY)
Admission: AD | Admit: 2019-01-31 | Discharge: 2019-02-04 | DRG: 885 | Disposition: A | Discharge status: Home / Self Care | Payer: BC Managed Care – PPO | Source: Intra-hospital | Attending: Psychiatry | Admitting: Psychiatry

## 2019-01-31 ENCOUNTER — Other Ambulatory Visit: Payer: Self-pay

## 2019-01-31 ENCOUNTER — Encounter (HOSPITAL_COMMUNITY): Payer: Self-pay

## 2019-01-31 DIAGNOSIS — Z90710 Acquired absence of both cervix and uterus: Secondary | ICD-10-CM

## 2019-01-31 DIAGNOSIS — Z9049 Acquired absence of other specified parts of digestive tract: Secondary | ICD-10-CM | POA: Diagnosis not present

## 2019-01-31 DIAGNOSIS — I10 Essential (primary) hypertension: Secondary | ICD-10-CM | POA: Diagnosis present

## 2019-01-31 DIAGNOSIS — F419 Anxiety disorder, unspecified: Secondary | ICD-10-CM | POA: Diagnosis present

## 2019-01-31 DIAGNOSIS — B182 Chronic viral hepatitis C: Secondary | ICD-10-CM | POA: Diagnosis present

## 2019-01-31 DIAGNOSIS — F1123 Opioid dependence with withdrawal: Secondary | ICD-10-CM | POA: Diagnosis not present

## 2019-01-31 DIAGNOSIS — Z8249 Family history of ischemic heart disease and other diseases of the circulatory system: Secondary | ICD-10-CM | POA: Diagnosis not present

## 2019-01-31 DIAGNOSIS — M797 Fibromyalgia: Secondary | ICD-10-CM | POA: Diagnosis present

## 2019-01-31 DIAGNOSIS — G4710 Hypersomnia, unspecified: Secondary | ICD-10-CM | POA: Diagnosis present

## 2019-01-31 DIAGNOSIS — Z62811 Personal history of psychological abuse in childhood: Secondary | ICD-10-CM | POA: Diagnosis present

## 2019-01-31 DIAGNOSIS — F17210 Nicotine dependence, cigarettes, uncomplicated: Secondary | ICD-10-CM | POA: Diagnosis present

## 2019-01-31 DIAGNOSIS — Z1159 Encounter for screening for other viral diseases: Secondary | ICD-10-CM | POA: Diagnosis not present

## 2019-01-31 DIAGNOSIS — Z79899 Other long term (current) drug therapy: Secondary | ICD-10-CM

## 2019-01-31 DIAGNOSIS — F1110 Opioid abuse, uncomplicated: Secondary | ICD-10-CM | POA: Diagnosis present

## 2019-01-31 DIAGNOSIS — F322 Major depressive disorder, single episode, severe without psychotic features: Principal | ICD-10-CM | POA: Diagnosis present

## 2019-01-31 DIAGNOSIS — F10239 Alcohol dependence with withdrawal, unspecified: Secondary | ICD-10-CM | POA: Diagnosis not present

## 2019-01-31 DIAGNOSIS — R45851 Suicidal ideations: Secondary | ICD-10-CM | POA: Diagnosis present

## 2019-01-31 DIAGNOSIS — Y908 Blood alcohol level of 240 mg/100 ml or more: Secondary | ICD-10-CM | POA: Diagnosis present

## 2019-01-31 DIAGNOSIS — Z79890 Hormone replacement therapy (postmenopausal): Secondary | ICD-10-CM

## 2019-01-31 DIAGNOSIS — Z91419 Personal history of unspecified adult abuse: Secondary | ICD-10-CM | POA: Diagnosis not present

## 2019-01-31 DIAGNOSIS — F10229 Alcohol dependence with intoxication, unspecified: Secondary | ICD-10-CM | POA: Diagnosis present

## 2019-01-31 HISTORY — DX: Anxiety disorder, unspecified: F41.9

## 2019-01-31 MED ORDER — LEVOTHYROXINE SODIUM 25 MCG PO TABS
25.0000 ug | ORAL_TABLET | Freq: Every day | ORAL | Status: DC
  Administered 2019-01-31 – 2019-02-04 (×5): 25 ug via ORAL
  Filled 2019-01-31 (×8): qty 1

## 2019-01-31 MED ORDER — PRENATAL MULTIVITAMIN CH
1.0000 | ORAL_TABLET | Freq: Every day | ORAL | Status: DC
  Administered 2019-01-31 – 2019-02-04 (×5): 1 via ORAL
  Filled 2019-01-31 (×7): qty 1

## 2019-01-31 MED ORDER — DULOXETINE HCL 60 MG PO CPEP
60.0000 mg | ORAL_CAPSULE | Freq: Two times a day (BID) | ORAL | Status: DC
  Administered 2019-01-31 – 2019-02-04 (×8): 60 mg via ORAL
  Filled 2019-01-31 (×12): qty 1

## 2019-01-31 MED ORDER — HYDROXYZINE HCL 50 MG PO TABS: 50.0000 mg | ORAL_TABLET | Freq: Four times a day (QID) | ORAL | Status: DC | PRN

## 2019-01-31 MED ORDER — ADULT MULTIVITAMIN W/MINERALS CH
1.0000 | ORAL_TABLET | Freq: Every day | ORAL | Status: DC
  Administered 2019-01-31 – 2019-02-04 (×4): 1 via ORAL
  Filled 2019-01-31 (×8): qty 1

## 2019-01-31 MED ORDER — ALUM & MAG HYDROXIDE-SIMETH 200-200-20 MG/5ML PO SUSP: 30.0000 mL | ORAL | Status: DC | PRN

## 2019-01-31 MED ORDER — TIZANIDINE HCL 2 MG PO TABS: 2.0000 mg | ORAL_TABLET | Freq: Two times a day (BID) | ORAL | Status: DC | PRN

## 2019-01-31 MED ORDER — NICOTINE 21 MG/24HR TD PT24
21.0000 mg | MEDICATED_PATCH | Freq: Every day | TRANSDERMAL | Status: DC
  Administered 2019-01-31 – 2019-02-04 (×5): 21 mg via TRANSDERMAL
  Filled 2019-01-31 (×8): qty 1

## 2019-01-31 MED ORDER — CLONIDINE HCL 0.1 MG PO TABS
0.1000 mg | ORAL_TABLET | Freq: Three times a day (TID) | ORAL | Status: DC
  Administered 2019-01-31 – 2019-02-02 (×9): 0.1 mg via ORAL
  Filled 2019-01-31 (×14): qty 1

## 2019-01-31 MED ORDER — LOPERAMIDE HCL 2 MG PO CAPS: 2.0000 mg | ORAL_CAPSULE | ORAL | Status: AC | PRN

## 2019-01-31 MED ORDER — THIAMINE HCL 100 MG/ML IJ SOLN: 100.0000 mg | Freq: Once | INTRAMUSCULAR | Status: DC

## 2019-01-31 MED ORDER — LORAZEPAM 1 MG PO TABS
1.0000 mg | ORAL_TABLET | Freq: Four times a day (QID) | ORAL | Status: AC | PRN
  Administered 2019-01-31 – 2019-02-02 (×5): 1 mg via ORAL
  Filled 2019-01-31 (×4): qty 1

## 2019-01-31 MED ORDER — TRAZODONE HCL 100 MG PO TABS
100.0000 mg | ORAL_TABLET | Freq: Every evening | ORAL | Status: DC | PRN
  Administered 2019-02-03: 100 mg via ORAL
  Filled 2019-01-31: qty 1

## 2019-01-31 MED ORDER — DULOXETINE HCL 20 MG PO CPEP
40.0000 mg | ORAL_CAPSULE | Freq: Every day | ORAL | Status: DC
  Administered 2019-01-31: 40 mg via ORAL
  Filled 2019-01-31 (×4): qty 2

## 2019-01-31 MED ORDER — TEMAZEPAM 15 MG PO CAPS
30.0000 mg | ORAL_CAPSULE | Freq: Every day | ORAL | Status: DC
  Administered 2019-01-31 – 2019-02-03 (×4): 30 mg via ORAL
  Filled 2019-01-31 (×3): qty 2

## 2019-01-31 MED ORDER — ACETAMINOPHEN 325 MG PO TABS
650.0000 mg | ORAL_TABLET | Freq: Four times a day (QID) | ORAL | Status: DC | PRN
  Administered 2019-02-03 – 2019-02-04 (×3): 650 mg via ORAL
  Filled 2019-01-31 (×3): qty 2

## 2019-01-31 MED ORDER — ONDANSETRON 4 MG PO TBDP: 4.0000 mg | ORAL_TABLET | Freq: Four times a day (QID) | ORAL | Status: AC | PRN

## 2019-01-31 MED ORDER — GABAPENTIN 600 MG PO TABS
600.0000 mg | ORAL_TABLET | Freq: Three times a day (TID) | ORAL | Status: DC
  Administered 2019-01-31 – 2019-02-04 (×14): 600 mg via ORAL
  Filled 2019-01-31 (×23): qty 1

## 2019-01-31 MED ORDER — MAGNESIUM HYDROXIDE 400 MG/5ML PO SUSP: 30.0000 mL | Freq: Every day | ORAL | Status: DC | PRN

## 2019-01-31 MED ORDER — VITAMIN B-1 100 MG PO TABS
100.0000 mg | ORAL_TABLET | Freq: Every day | ORAL | Status: DC
  Administered 2019-02-01 – 2019-02-04 (×4): 100 mg via ORAL
  Filled 2019-01-31 (×6): qty 1

## 2019-01-31 MED ORDER — HYDROXYZINE HCL 25 MG PO TABS
25.0000 mg | ORAL_TABLET | Freq: Four times a day (QID) | ORAL | Status: AC | PRN
  Administered 2019-02-01 – 2019-02-02 (×2): 25 mg via ORAL
  Filled 2019-01-31 (×2): qty 1

## 2019-01-31 MED ORDER — TRAMADOL HCL 50 MG PO TABS
100.0000 mg | ORAL_TABLET | Freq: Two times a day (BID) | ORAL | Status: DC
  Administered 2019-01-31 – 2019-02-01 (×3): 100 mg via ORAL
  Filled 2019-01-31 (×3): qty 2

## 2019-01-31 NOTE — Progress Notes (Signed)
1:1 Nursing Note:  D.  Pt has appeared to sleep on and off but will sit up and appear confused at times.  She requires re-direction often.  Pt has been very unsteady when getting up, MHT has assisted with gait belt.  A.  Pt remains on a 1:1 as ordered for her safety  R.    Pt remains safe on the unit

## 2019-01-31 NOTE — Progress Notes (Signed)
Recreation Therapy Notes  Date:  6.15.20 Time: 0930 Location: [address] Hall Dayroom  Group Topic: Stress Management  Goal Area(s) Addresses:  Patient will identify positive stress management techniques. Patient will identify benefits of using stress management post d/c.  Intervention: Stress Management  Activity :  Meditation.  LRT introduced the stress management technique of meditation.  LRT played a meditation of making the most of your day.  Patients were to listen and follow along as meditation played to engage in activity.  Education:  Stress Management, Discharge Planning.   Education Outcome: Acknowledges Education  Clinical Observations/Feedback:  Pt did not attend group session.    Kie Calvin, LRT/CTRS         Burlie Cajamarca A 01/31/2019 11:12 AM 

## 2019-01-31 NOTE — Tx Team (Signed)
Interdisciplinary Treatment and Diagnostic Plan Update  01/31/2019 Time of Session: 9:00am [place]la [place] MRN: [id number]  Principal Diagnosis: <principal problem not specified>  Secondary Diagnoses: Active Problems:   MDD (major depressive disorder), severe (HCC)   Current Medications:  Current Facility-Administered Medications  Medication Dose Route Frequency Provider Last Rate Last Dose  . acetaminophen (TYLENOL) tablet 650 mg  650 mg Oral Q6H PRN Money, Lowry Ram, FNP      . alum & mag hydroxide-simeth (MAALOX/MYLANTA) 200-200-20 MG/5ML suspension 30 mL  30 mL Oral Q4H PRN Money, Darnelle Maffucci B, FNP      . cloNIDine (CATAPRES) tablet 0.1 mg  0.1 mg Oral TID AC & HS Money, Darnelle Maffucci B, FNP   0.1 mg at 01/31/19 0736  . DULoxetine (CYMBALTA) DR capsule 40 mg  40 mg Oral Daily Money, Lowry Ram, FNP   40 mg at 01/31/19 0736  . gabapentin (NEURONTIN) tablet 600 mg  600 mg Oral TID Money, Lowry Ram, FNP   600 mg at 01/31/19 0736  . hydrOXYzine (ATARAX/VISTARIL) tablet 25 mg  25 mg Oral Q6H PRN Money, Lowry Ram, FNP      . hydrOXYzine (ATARAX/VISTARIL) tablet 50 mg  50 mg Oral Q6H PRN Money, Lowry Ram, FNP      . levothyroxine (SYNTHROID) tablet 25 mcg  25 mcg Oral Q0600 Money, Lowry Ram, FNP   25 mcg at 01/31/19 8242  . loperamide (IMODIUM) capsule 2-4 mg  2-4 mg Oral PRN Money, Lowry Ram, FNP      . LORazepam (ATIVAN) tablet 1 mg  1 mg Oral Q6H PRN Money, Darnelle Maffucci B, FNP      . magnesium hydroxide (MILK OF MAGNESIA) suspension 30 mL  30 mL Oral Daily PRN Money, Darnelle Maffucci B, FNP      . multivitamin with minerals tablet 1 tablet  1 tablet Oral Daily Money, Lowry Ram, FNP   1 tablet at 01/31/19 0736  . nicotine (NICODERM CQ - dosed in mg/24 hours) patch 21 mg  21 mg Transdermal Daily Cobos, Myer Peer, MD   21 mg at 01/31/19 0736  . ondansetron (ZOFRAN-ODT) disintegrating tablet 4 mg  4 mg Oral Q6H PRN Money, Darnelle Maffucci B, FNP      . thiamine (B-1) injection 100 mg  100 mg Intramuscular Once Money, Lowry Ram, FNP       . [START ON 02/01/2019] thiamine (VITAMIN B-1) tablet 100 mg  100 mg Oral Daily Money, Darnelle Maffucci B, FNP      . tiZANidine (ZANAFLEX) tablet 2 mg  2 mg Oral BID PRN Money, Lowry Ram, FNP      . traZODone (DESYREL) tablet 100 mg  100 mg Oral QHS PRN Money, Lowry Ram, FNP       PTA Medications: Medications Prior to Admission  Medication Sig Dispense Refill Last Dose  . acetaminophen (TYLENOL) 325 MG tablet Take 2 tablets (650 mg total) by mouth every 4 (four) hours as needed for mild pain or fever.     Marland Kitchen albuterol (VENTOLIN HFA) 108 (90 Base) MCG/ACT inhaler Inhale 1-2 puffs into the lungs every 6 (six) hours as needed for wheezing or shortness of breath.     . ARIPiprazole (ABILIFY) 10 MG tablet Take 1 tablet (10 mg total) by mouth daily. For mood control 15 tablet 0   . ARIPiprazole ER (ABILIFY MAINTENA) 400 MG SRER injection Inject 2 mLs (400 mg total) into the muscle every 28 (twenty-eight) days. (Due on 02-22-18): For mood control 1 each 0   . cloNIDine (  CATAPRES) 0.1 MG tablet Take 1 tablet (0.1 mg total) by mouth 4 (four) times daily - after meals and at bedtime. For high blood pressure & anxiety symptoms 60 tablet 0   . cloNIDine (CATAPRES) 0.1 MG tablet Take 0.1 mg by mouth 4 (four) times daily as needed (high blood pressure and anxiety.).      Marland Kitchen docusate sodium (COLACE) 100 MG capsule Take 1 capsule (100 mg total) by mouth 2 (two) times daily. 10 capsule 0   . DULoxetine (CYMBALTA) 20 MG capsule Take 60 mg by mouth 2 (two) times daily.     . DULoxetine 40 MG CPEP Take 40 mg by mouth daily. For depression 30 capsule 0   . enoxaparin (LOVENOX) 40 MG/0.4ML injection Inject 0.4 mLs (40 mg total) into the skin daily for 14 days. 5.6 mL 0   . etodolac (LODINE) 200 MG capsule Take 2 capsules (400 mg total) by mouth 2 (two) times daily. For pain management  0   . gabapentin (NEURONTIN) 300 MG capsule Take 2 capsules (600 mg total) by mouth 3 (three) times daily. For agitation 180 capsule 0   . HUMIRA  PEN 40 MG/0.4ML PNKT Inject 40 mg into the skin every 14 (fourteen) days.     . hydrOXYzine (ATARAX/VISTARIL) 50 MG tablet Take 1 tablet (50 mg total) by mouth every 6 (six) hours as needed for anxiety. 60 tablet 0   . levothyroxine (SYNTHROID, LEVOTHROID) 25 MCG tablet Take 1 tablet (25 mcg total) by mouth daily before breakfast. For thyroid hormone replacement     . levothyroxine (SYNTHROID, LEVOTHROID) 25 MCG tablet Take 0.025 mcg by mouth daily before breakfast.     . lidocaine (LIDODERM) 5 % Place 1 patch onto the skin daily. Remove & Discard patch within 12 hours or as directed by MD     . methadone (DOLOPHINE) 1 MG/1ML solution Take 95 mg by mouth daily.     . methadone (DOLOPHINE) 10 MG tablet Take 17 tablets (170 mg total) by mouth daily. For opioid addiction 1 tablet 0   . methadone (DOLOPHINE) 10 MG tablet Take 7 tablets (70 mg total) by mouth daily. You must go to your methadone clinic for prescription.  0   . Multiple Vitamin (MULTIVITAMIN WITH MINERALS) TABS tablet Take 1 tablet by mouth daily. (May purchase from over the counter): Vitamin supplement (Patient not taking: Reported on 01/29/2019)     . Multiple Vitamin (MULTIVITAMIN WITH MINERALS) TABS tablet Take 1 tablet by mouth daily.     . nicotine (NICODERM CQ - DOSED IN MG/24 HOURS) 21 mg/24hr patch Place 1 patch (21 mg total) onto the skin daily. (May purchase from over the counter): For smoking cessation (Patient not taking: Reported on 01/29/2019) 28 patch 0   . oxyCODONE (OXY IR/ROXICODONE) 5 MG immediate release tablet Take 1 tablet (5 mg total) by mouth every 6 (six) hours as needed for moderate pain or severe pain. 25 tablet 0   . polyethylene glycol (MIRALAX / GLYCOLAX) 17 g packet Take 17 g by mouth daily. 14 each 0   . tiZANidine (ZANAFLEX) 2 MG tablet Take 2 mg by mouth 2 (two) times a day.     . traZODone (DESYREL) 100 MG tablet Take 1 tablet (100 mg total) by mouth at bedtime. For sleep 30 tablet 0   . traZODone (DESYREL)  100 MG tablet Take 100 mg by mouth at bedtime as needed for sleep.       Patient Stressors: Health  problems Marital or family conflict Substance abuse  Patient Strengths: Ability for insight Average or above average intelligence Motivation for treatment/growth  Treatment Modalities: Medication Management, Group therapy, Case management,  1 to 1 session with clinician, Psychoeducation, Recreational therapy.   Physician Treatment Plan for Primary Diagnosis: <principal problem not specified> Long Term Goal(s):     Short Term Goals:    Medication Management: Evaluate patient's response, side effects, and tolerance of medication regimen.  Therapeutic Interventions: 1 to 1 sessions, Unit Group sessions and Medication administration.  Evaluation of Outcomes: Not Met  Physician Treatment Plan for Secondary Diagnosis: Active Problems:   MDD (major depressive disorder), severe ([place])  Long Term Goal(s):     Short Term Goals:       Medication Management: Evaluate patient's response, side effects, and tolerance of medication regimen.  Therapeutic Interventions: 1 to 1 sessions, Unit Group sessions and Medication administration.  Evaluation of Outcomes: Not Met   RN Treatment Plan for Primary Diagnosis: <principal problem not specified> Long Term Goal(s): Knowledge of disease and therapeutic regimen to maintain health will improve  Short Term Goals: Ability to verbalize feelings will improve, Ability to identify and develop effective coping behaviors will improve and Compliance with prescribed medications will improve  Medication Management: RN will administer medications as ordered by provider, will assess and evaluate patient's response and provide education to patient for prescribed medication. RN will report any adverse and/or side effects to prescribing provider.  Therapeutic Interventions: 1 on 1 counseling sessions, Psychoeducation, Medication administration, Evaluate  responses to treatment, Monitor vital signs and CBGs as ordered, Perform/monitor CIWA, COWS, AIMS and Fall Risk screenings as ordered, Perform wound care treatments as ordered.  Evaluation of Outcomes: Not Met   LCSW Treatment Plan for Primary Diagnosis: <principal problem not specified> Long Term Goal(s): Safe transition to appropriate next level of care at discharge, Engage patient in therapeutic group addressing interpersonal concerns.  Short Term Goals: Engage patient in aftercare planning with referrals and resources, Increase social support, Increase emotional regulation, Identify triggers associated with mental health/substance abuse issues and Increase skills for wellness and recovery  Therapeutic Interventions: Assess for all discharge needs, 1 to 1 time with Social worker, Explore available resources and support systems, Assess for adequacy in community support network, Educate family and significant other(s) on suicide prevention, Complete Psychosocial Assessment, Interpersonal group therapy.  Evaluation of Outcomes: Not Met   Progress in Treatment: Attending groups: No. New to unit. Participating in groups: No. Taking medication as prescribed: Yes. Toleration medication: Yes. Family/Significant other contact made: No, will contact:  supports if consents are granted. Patient understands diagnosis: Yes. Discussing patient identified problems/goals with staff: Yes. Medical problems stabilized or resolved: No. Denies suicidal/homicidal ideation: No. Issues/concerns per patient self-inventory: Yes.  New problem(s) identified: Yes, Describe:  Recent separation from spouse, upcoming court dates, family stressors  New Short Term/Long Term Goal(s): detox, medication management for mood stabilization; elimination of SI thoughts; development of comprehensive mental wellness/sobriety plan.  Patient Goals:    Discharge Plan or Barriers: CSW continuing to assess for appropriate  referrals.   Reason for Continuation of Hospitalization: Anxiety Depression Medication stabilization Suicidal ideation Withdrawal symptoms  Estimated Length of Stay: 3-5 days  Attendees: Patient: 01/31/2019 10:13 AM  Physician:  01/31/2019 10:13 AM  Nursing:  01/31/2019 10:13 AM  RN Care Manager: 01/31/2019 10:13 AM  Social Worker: Stephanie Acre, [place] 01/31/2019 10:13 AM  Recreational Therapist:  01/31/2019 10:13 AM  Other:  01/31/2019 10:13 AM  Other:  01/31/2019 10:13 AM  Other: 01/31/2019 10:13 AM    Scribe for Treatment Team: Joellen Jersey, [place] 01/31/2019 10:13 AM
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
## 2019-01-31 NOTE — ED Notes (Signed)
[facility] called for transport. State they will be here in 15 minutes.
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
## 2019-01-31 NOTE — Tx Team (Signed)
Initial Treatment Plan 01/31/2019 2:16 AM Ann Held ZOX:[id number]    PATIENT STRESSORS: Health problems Marital or family conflict Substance abuse   PATIENT STRENGTHS: Ability for insight Average or above average intelligence Motivation for treatment/growth   PATIENT IDENTIFIED PROBLEMS: "My drinking"  Suicidal thoughts                   DISCHARGE CRITERIA:  Improved stabilization in mood, thinking, and/or behavior  PRELIMINARY DISCHARGE PLAN: Attend 12-step recovery group Outpatient therapy Return to previous living arrangement  PATIENT/FAMILY INVOLVEMENT: This treatment plan has been presented to and reviewed with the patient, Carla Park.  The patient and family have been given the opportunity to ask questions and make suggestions.  Marya Landry, RN 01/31/2019, 2:16 AM

## 2019-01-31 NOTE — Progress Notes (Signed)
Nursing Progress Note: 7p-7a D: Pt currently presents with a depressed/anxious/tremulous/unsteady affect and behavior. Interacting appropriately with the milieu. Pt reports good sleep during the previous night with current medication regimen.  A: Pt provided with medications per providers orders. Pt's labs and vitals were monitored throughout the night. Pt supported emotionally and encouraged to express concerns and questions. Pt educated on medications.  R: Pt's safety ensured with 15 minute and environmental checks. Pt currently denies SI, HI, and AVH. Pt verbally contracts to seek staff if SI,HI, or AVH occurs and to consult with staff before acting on any harmful thoughts. Will continue to monitor.   [place] NOVEL CORONAVIRUS (COVID-19) DAILY CHECK-OFF SYMPTOMS - answer yes or no to each - every day NO YES  Have you had a fever in the past 24 hours?  . Fever (Temp > 37.80C / 100F) X   Have you had any of these symptoms in the past 24 hours? . New Cough .  Sore Throat  .  Shortness of Breath .  Difficulty Breathing .  Unexplained Body Aches   X   Have you had any one of these symptoms in the past 24 hours not related to allergies?   . Runny Nose .  Nasal Congestion .  Sneezing   X   If you have had runny nose, nasal congestion, sneezing in the past 24 hours, has it worsened?  X   EXPOSURES - check yes or no X   Have you traveled outside the state in the past 14 days?  X   Have you been in contact with someone with a confirmed diagnosis of COVID-19 or PUI in the past 14 days without wearing appropriate PPE?  X   Have you been living in the same home as a person with confirmed diagnosis of COVID-19 or a PUI (household contact)?    X   Have you been diagnosed with COVID-19?    X              What to do next: Answered NO to all: Answered YES to anything:   Proceed with unit schedule Follow the [facility] Inpatient Flowsheet.

## 2019-01-31 NOTE — Progress Notes (Signed)
1:1 Note  Patient is observed lying in bed with her eyes closed. Patient is in no distress, sitter is within arms reach. 

## 2019-01-31 NOTE — Progress Notes (Signed)
1:1 Note  Patient is observed lying in bed with her eyes closed. Patient is in no distress, sitter is within arms reach.

## 2019-01-31 NOTE — ED Notes (Signed)
Report called to Liz RN.

## 2019-01-31 NOTE — Progress Notes (Signed)
1:1 Progress Note D: Pt currently in dayroom interacting with milieu. Patient appropriate to situation. Pt in no current distress.  A: Sitter is currently sitting beside pt. R: Pt remains safe on a 1:1 per MD orders.

## 2019-01-31 NOTE — Progress Notes (Signed)
1:1 Note  Patient is sitting in dayroom, drowsy. Sitter has had to redirect patient several times to stay seated and to walk in the right direction. Patient is in no distress. Sitter is within arms reach. Will continue to monitor and assess.

## 2019-01-31 NOTE — Progress Notes (Signed)
[place] NOVEL CORONAVIRUS (COVID-19) DAILY CHECK-OFF SYMPTOMS - answer yes or no to each - every day NO YES  Have you had a fever in the past 24 hours?  . Fever (Temp > 37.80C / 100F) X   Have you had any of these symptoms in the past 24 hours? . New Cough .  Sore Throat  .  Shortness of Breath .  Difficulty Breathing .  Unexplained Body Aches   X   Have you had any one of these symptoms in the past 24 hours not related to allergies?   . Runny Nose .  Nasal Congestion .  Sneezing   X   If you have had runny nose, nasal congestion, sneezing in the past 24 hours, has it worsened?  X   EXPOSURES - check yes or no X   Have you traveled outside the state in the past 14 days?  X   Have you been in contact with someone with a confirmed diagnosis of COVID-19 or PUI in the past 14 days without wearing appropriate PPE?  X   Have you been living in the same home as a person with confirmed diagnosis of COVID-19 or a PUI (household contact)?    X   Have you been diagnosed with COVID-19?    X              What to do next: Answered NO to all: Answered YES to anything:   Proceed with unit schedule Follow the [facility] Inpatient Flowsheet.   

## 2019-01-31 NOTE — Plan of Care (Addendum)
Patient was depressed upon approach. Denied SI HI AVH and said she slept well. Patient is complaining of neck pain 5/10, heat given. Patient did not as for PRN medications. Patient is compliant with medications- denies any side effects. Safety is maintained with 15 minute checks as well as environmental checks. Will continue to monitor and assess throughout the shift and monitor for safety.  Problem: Education: Goal: Ability to state activities that reduce stress will improve Outcome: Progressing   Problem: Coping: Goal: Ability to identify and develop effective coping behavior will improve Outcome: Progressing   Problem: Self-Concept: Goal: Ability to identify factors that promote anxiety will improve Outcome: Progressing Goal: Level of anxiety will decrease Outcome: Progressing
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
## 2019-02-01 NOTE — Progress Notes (Signed)
[place] Post 1:1 Observation Documentation  For the first (8) hours following discontinuation of 1:1 precautions, a progress note entry by nursing staff should be documented at least every 2 hours, reflecting the patient's behavior, condition, mood, and conversation. Use the progress notes for additional entries.  Time 1:1 discontinued:0800  Patient's Behavior:Appropriate/cooperative  Patient's Condition:steady  Patient's Conversation:appropriate  Carla Park 02/01/2019,12:00 PM

## 2019-02-01 NOTE — Progress Notes (Signed)
[place] Post 1:1 Observation Documentation  For the first (8) hours following discontinuation of 1:1 precautions, a progress note entry by nursing staff should be documented at least every 2 hours, reflecting the patient's behavior, condition, mood, and conversation. Use the progress notes for additional entries.  Time 1:1 discontinued:0800  Patient's Behavior:Appropriate/cooperative  Patient's Condition:steady  Patient's Conversation:appropriate  [name] 02/01/2019,4:00 PM

## 2019-02-01 NOTE — Progress Notes (Signed)
1:1 Progress Note D: Pt currently asleep in bed. Patient appropriate to situation. Pt in no current distress.  A: Sitter is currently at bedside. R: Pt remains safe on a 1:1 per MD orders.   

## 2019-02-01 NOTE — Progress Notes (Signed)
[place] Post 1:1 Observation Documentation  For the first (8) hours following discontinuation of 1:1 precautions, a progress note entry by nursing staff should be documented at least every 2 hours, reflecting the patient's behavior, condition, mood, and conversation. Use the progress notes for additional entries.  Time 1:1 discontinued:0800  Patient's Behavior:Appropriate/cooperative  Patient's Condition:steady  Patient's Conversation:appropriate  Carla Park 02/01/2019,2:00 PM
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
## 2019-02-01 NOTE — Progress Notes (Signed)
[place] Post 1:1 Observation Documentation  For the first (8) hours following discontinuation of 1:1 precautions, a progress note entry by nursing staff should be documented at least every 2 hours, reflecting the patient's behavior, condition, mood, and conversation.  Use the progress notes for additional entries.  Time 1:1 discontinued:  0800  Patient's Behavior:  Appropriate/cooperative  Patient's Condition:  steady  Patient's Conversation:  appropriate  Carla Park 02/01/2019, 10:00 AM

## 2019-02-01 NOTE — BHH Group Notes (Signed)
[facility] Group Therapy 02/01/2019 2:55 PM  Type of Therapy: Mental Health Association Presentation  Participation Level: Active  Participation Quality: Attentive  Affect: Appropriate  Cognitive: Oriented  Insight: Developing/Improving  Engagement in Therapy: Engaged  Modes of Intervention: Discussion, Education and Socialization   Summary of Progress/Problems: [place] Speaker came to talk about his personal journey with mental health. The pt processed ways by which to relate to the speaker. [place] speaker provided handouts and educational information pertaining to groups and services offered by the [facility]. Pt was engaged in speaker's presentation and was receptive to resources provided.   Stephanie Acre, MSW, [facility] 02/01/2019 2:55 PM

## 2019-02-01 NOTE — Progress Notes (Signed)
[place] Post 1:1 Observation Documentation  For the first (8) hours following discontinuation of 1:1 precautions, a progress note entry by nursing staff should be documented at least every 2 hours, reflecting the patient's behavior, condition, mood, and conversation.  Use the progress notes for additional entries.  Time 1:1 discontinued:  0800  Patient's Behavior:  Appropriate/cooperative  Patient's Condition:  steady  Patient's Conversation:  appropriate  Megan Mans 02/01/2019, 8:30 AM

## 2019-02-01 NOTE — Plan of Care (Addendum)
Patient was anxious and depressed upon approach this morning. Denies SI HI AVH, but endorses pain in her pelvis, neck, and back. Pain medication given.Patient was asking for her methadone to be restarted- MD notified and request was denied. Patient notified and said "well I might as well just go back to the clinic I was getting it from."  Safety is maintained with 15 minute checks as well as environmental checks. Will continue to monitor and assess throughout the shift.  Problem: Education: Goal: Ability to state activities that reduce stress will improve Outcome: Progressing   Problem: Coping: Goal: Ability to identify and develop effective coping behavior will improve Outcome: Progressing   Problem: Self-Concept: Goal: Ability to identify factors that promote anxiety will improve Outcome: Progressing Goal: Level of anxiety will decrease Outcome: Progressing Goal: Ability to modify response to factors that promote anxiety will improve Outcome: Progressing

## 2019-02-01 NOTE — Progress Notes (Addendum)
[facility] MD Progress Note  02/01/2019 11:40 AM Carla Park  MRN:  [id number] Subjective:   Pt focused on methadone yet needs detox from all compounds of abuse-dep no si today-epi today was focused on allowing her to see that these are all compounds of addiction and dependency and the methadone has not been successful for her as far as staying alcohol and drug-free. Less pain focus Principal Problem: <principal problem not specified> Diagnosis: Active Problems:   MDD (major depressive disorder), severe (HCC)  Total Time spent with patient: 20 minutes   Past Medical History:  Past Medical History:  Diagnosis Date  . Anxiety   . Arthritis   . Chronic pain   . Closed fracture of anterior wall of left acetabulum (HCC) 11/11/2018  . Depression   . ETOH abuse   . Fibromyalgia   . Hep C w/ coma, chronic (HCC)   . Hepatitis C   . Hypertension   . Opiate abuse, continuous (HCC)    on Methadone currently  . Suicide attempt (HCC)    cut wrist  . Vision abnormalities     Past Surgical History:  Procedure Laterality Date  . ABDOMINAL HYSTERECTOMY    . APPENDECTOMY    . CESAREAN SECTION    . CHOLECYSTECTOMY    . KNEE SURGERY    . LAPAROTOMY N/A 11/09/2018   Procedure: EXPLORATORY LAPAROTOMY with Cystotomy repair and exploration;  Surgeon: Emelia Loron, MD;  Location: [facility] OR;  Service: General;  Laterality: N/A;  . THORACIC AORTIC ENDOVASCULAR STENT GRAFT N/A 11/11/2018   Procedure: THORACIC AORTIC ENDOVASCULAR STENT GRAFT;  Surgeon: Cephus Shelling, MD;  Location: [facility] OR;  Service: Vascular;  Laterality: N/A;   Family History:  Family History  Problem Relation Age of Onset  . Multiple sclerosis Mother   . Hypertension Father    Family Psychiatric  History: neg Social History:  Social History   Substance and Sexual Activity  Alcohol Use Yes   Comment: 2-500 ml boxes daily at least     Social History   Substance and Sexual Activity  Drug Use No    Social History    Socioeconomic History  . Marital status: Married    Spouse name: Not on file  . Number of children: Not on file  . Years of education: Not on file  . Highest education level: Not on file  Occupational History  . Not on file  Social Needs  . Financial resource strain: Not on file  . Food insecurity    Worry: Not on file    Inability: Not on file  . Transportation needs    Medical: Not on file    Non-medical: Not on file  Tobacco Use  . Smoking status: Current Every Day Smoker    Packs/day: 1.00    Types: Cigarettes  . Smokeless tobacco: Never Used  Substance and Sexual Activity  . Alcohol use: Yes    Comment: 2-500 ml boxes daily at least  . Drug use: No  . Sexual activity: Yes    Birth control/protection: Surgical  Lifestyle  . Physical activity    Days per week: Not on file    Minutes per session: Not on file  . Stress: Not on file  Relationships  . Social Musician on phone: Not on file    Gets together: Not on file    Attends religious service: Not on file    Active member of club or organization: Not on file  Attends meetings of clubs or organizations: Not on file    Relationship status: Not on file  Other Topics Concern  . Not on file  Social History Narrative   ** Merged History Encounter **       Additional Social History:                         Sleep: Good  Appetite:  Good  Current Medications: Current Facility-Administered Medications  Medication Dose Route Frequency Provider Last Rate Last Dose  . acetaminophen (TYLENOL) tablet 650 mg  650 mg Oral Q6H PRN Money, Lowry Ram, FNP      . alum & mag hydroxide-simeth (MAALOX/MYLANTA) 200-200-20 MG/5ML suspension 30 mL  30 mL Oral Q4H PRN Money, Darnelle Maffucci B, FNP      . cloNIDine (CATAPRES) tablet 0.1 mg  0.1 mg Oral TID AC & HS Money, Darnelle Maffucci B, FNP   0.1 mg at 02/01/19 0618  . DULoxetine (CYMBALTA) DR capsule 60 mg  60 mg Oral BID Johnn Hai, MD   60 mg at 02/01/19 0753  .  gabapentin (NEURONTIN) tablet 600 mg  600 mg Oral TID Money, Lowry Ram, FNP   600 mg at 02/01/19 0754  . hydrOXYzine (ATARAX/VISTARIL) tablet 25 mg  25 mg Oral Q6H PRN Money, Lowry Ram, FNP      . levothyroxine (SYNTHROID) tablet 25 mcg  25 mcg Oral Q0600 Money, Lowry Ram, FNP   25 mcg at 02/01/19 0618  . loperamide (IMODIUM) capsule 2-4 mg  2-4 mg Oral PRN Money, Lowry Ram, FNP      . LORazepam (ATIVAN) tablet 1 mg  1 mg Oral Q6H PRN Money, Lowry Ram, FNP   1 mg at 02/01/19 0753  . magnesium hydroxide (MILK OF MAGNESIA) suspension 30 mL  30 mL Oral Daily PRN Money, Lowry Ram, FNP      . multivitamin with minerals tablet 1 tablet  1 tablet Oral Daily Money, Lowry Ram, FNP   1 tablet at 02/01/19 0753  . nicotine (NICODERM CQ - dosed in mg/24 hours) patch 21 mg  21 mg Transdermal Daily Cobos, Myer Peer, MD   21 mg at 02/01/19 0753  . ondansetron (ZOFRAN-ODT) disintegrating tablet 4 mg  4 mg Oral Q6H PRN Money, Lowry Ram, FNP      . prenatal multivitamin tablet 1 tablet  1 tablet Oral Daily Johnn Hai, MD   1 tablet at 02/01/19 0753  . temazepam (RESTORIL) capsule 30 mg  30 mg Oral QHS Johnn Hai, MD   30 mg at 01/31/19 2111  . thiamine (B-1) injection 100 mg  100 mg Intramuscular Once Money, Darnelle Maffucci B, FNP      . thiamine (VITAMIN B-1) tablet 100 mg  100 mg Oral Daily Money, Lowry Ram, FNP   100 mg at 02/01/19 0753  . traZODone (DESYREL) tablet 100 mg  100 mg Oral QHS PRN Money, Lowry Ram, FNP        Lab Results: No results found for this or any previous visit (from the past 48 hour(s)).  Blood Alcohol level:  Lab Results  Component Value Date   ETH 286 (H) 01/29/2019   ETH 306 (HH) 01/75/1025    Metabolic Disorder Labs: Lab Results  Component Value Date   HGBA1C 4.6 (L) 01/22/2018   MPG 85.32 01/22/2018   MPG 91.06 09/14/2017   Lab Results  Component Value Date   PROLACTIN 20.2 01/22/2018   Lab Results  Component Value Date  CHOL 222 (H) 01/22/2018   TRIG 146 11/15/2018   HDL 119  01/22/2018   CHOLHDL 1.9 01/22/2018   VLDL 15 01/22/2018   LDLCALC 88 01/22/2018   LDLCALC 105 (H) 09/14/2017    Physical Findings: AIMS: Facial and Oral Movements Muscles of Facial Expression: None, normal Lips and Perioral Area: None, normal Jaw: None, normal Tongue: None, normal,Extremity Movements Upper (arms, wrists, hands, fingers): None, normal Lower (legs, knees, ankles, toes): None, normal, Trunk Movements Neck, shoulders, hips: None, normal, Overall Severity Severity of abnormal movements (highest score from questions above): None, normal Incapacitation due to abnormal movements: None, normal Patient's awareness of abnormal movements (rate only patient's report): No Awareness, Dental Status Current problems with teeth and/or dentures?: No Does patient usually wear dentures?: No  CIWA:  CIWA-Ar Total: 1 COWS:     Musculoskeletal: Strength & Muscle Tone: within normal limits Gait & Station: normal Patient leans: N/A  Psychiatric Specialty Exam: Physical Exam  ROS  Blood pressure 98/63, pulse (!) 134, temperature 98.1 F (36.7 C), temperature source Oral, resp. rate 20, height 5\' 2"  (1.575 m), weight 59 kg, last menstrual period 11/18/2018, SpO2 95 %.Body mass index is 23.79 kg/m.  General Appearance: Casual and Disheveled  Eye Contact: nl  Speech:  Clear and Coherent  Volume:  Decreased  Mood:  Dysphoric  Affect:  Congruent  Thought Process:  Coherent and Descriptions of Associations: Intact  Orientation:  Full (Time, Place, and Person)  Thought Content:  Logical  Suicidal Thoughts:  No  Homicidal Thoughts:  No  Memory:  Immediate;   Fair  Judgement:  Fair  Insight:  Fair  Psychomotor Activity:  Normal  Concentration:  Concentration: Poor  Recall:  Fair  Fund of Knowledge:  Good  Language:  Good  Akathisia:  Negative  Handed:  Right  AIMS (if indicated):     Assets:  Physical Health Resilience  ADL's:  Intact  Cognition:  WNL  Sleep:  Number of  Hours: 6.25     Treatment Plan Summary: Daily contact with patient to assess and evaluate symptoms and progress in treatment, Medication management and Plan cont cog therpay- detox prn- q 15 min  Gillermo Poch, MD 02/01/2019, 11:40 AM

## 2019-02-01 NOTE — Progress Notes (Signed)
Nursing Progress Note: 7p-7a D: Pt currently presents with a depressed/anxious affect and behavior. Pt sts, "I really am in a lot of pain." Interacting appropriately with the milieu. Pt reports good sleep during the previous night with current medication regimen.  A: Pt provided with medications per providers orders. Pt's labs and vitals were monitored throughout the night. Pt supported emotionally and encouraged to express concerns and questions. Pt educated on medications.  R: Pt's safety ensured with 15 minute and environmental checks. Pt currently denies SI, HI, and AVH. Pt verbally contracts to seek staff if SI,HI, or AVH occurs and to consult with staff before acting on any harmful thoughts. Will continue to monitor.   [place] NOVEL CORONAVIRUS (COVID-19) DAILY CHECK-OFF SYMPTOMS - answer yes or no to each - every day NO YES  Have you had a fever in the past 24 hours?  . Fever (Temp > 37.80C / 100F) X   Have you had any of these symptoms in the past 24 hours? . New Cough .  Sore Throat  .  Shortness of Breath .  Difficulty Breathing .  Unexplained Body Aches   X   Have you had any one of these symptoms in the past 24 hours not related to allergies?   . Runny Nose .  Nasal Congestion .  Sneezing   X   If you have had runny nose, nasal congestion, sneezing in the past 24 hours, has it worsened?  X   EXPOSURES - check yes or no X   Have you traveled outside the state in the past 14 days?  X   Have you been in contact with someone with a confirmed diagnosis of COVID-19 or PUI in the past 14 days without wearing appropriate PPE?  X   Have you been living in the same home as a person with confirmed diagnosis of COVID-19 or a PUI (household contact)?    X   Have you been diagnosed with COVID-19?    X              What to do next: Answered NO to all: Answered YES to anything:   Proceed with unit schedule Follow the [facility] Inpatient Flowsheet.

## 2019-02-02 MED ORDER — KETOROLAC TROMETHAMINE 60 MG/2ML IM SOLN
60.0000 mg | Freq: Once | INTRAMUSCULAR | Status: AC
  Administered 2019-02-02: 60 mg via INTRAMUSCULAR
  Filled 2019-02-02 (×2): qty 2

## 2019-02-02 MED ORDER — CLONIDINE HCL 0.2 MG PO TABS
0.2000 mg | ORAL_TABLET | Freq: Three times a day (TID) | ORAL | Status: AC
  Administered 2019-02-02 – 2019-02-03 (×3): 0.2 mg via ORAL
  Filled 2019-02-02 (×3): qty 1

## 2019-02-02 MED ORDER — METHOCARBAMOL 750 MG PO TABS
750.0000 mg | ORAL_TABLET | Freq: Three times a day (TID) | ORAL | Status: DC
  Administered 2019-02-02 – 2019-02-04 (×7): 750 mg via ORAL
  Filled 2019-02-02 (×13): qty 1

## 2019-02-02 NOTE — Progress Notes (Signed)
Adult Psychoeducational Group Note  Date:  02/02/2019 Time:  9:06 PM  Group Topic/Focus:  Wrap-Up Group:   The focus of this group is to help patients review their daily goal of treatment and discuss progress on daily workbooks.  Participation Level:  Active  Participation Quality:  Appropriate  Affect:  Appropriate  Cognitive:  Alert  Insight: Appropriate  Engagement in Group:  Engaged  Modes of Intervention:  Discussion  Additional Comments:  Pt rated day 3/10. Pt stated that her goal is to make it through the methadone withdrawals. Pt stated that she feels horrible, but that she is trying to make it.   Wynelle Fanny R 02/02/2019, 9:06 PM

## 2019-02-02 NOTE — Progress Notes (Signed)
Recreation Therapy Notes  Date:  6.17.20 Time: 0930 Location: [address] Dayroom  Group Topic: Stress Management  Goal Area(s) Addresses:  Patient will identify positive stress management techniques. Patient will identify benefits of using stress management post d/c.  Behavioral Response:  Engaged  Intervention:  Stress Management  Activity :  Meditation.  LRT introduced the stress management technique of meditation.  LRT played a meditation on letting go of the past and focusing on the present.  Patients were to follow along as the meditation was played to engage in activity.    Education:  Stress Management, Discharge Planning.   Education Outcome: Acknowledges Education  Clinical Observations/Feedback:  Pt attended group session.     Victorino Sparrow, LRT/CTRS         Ria Comment, Isair Inabinet A 02/02/2019 11:19 AM

## 2019-02-02 NOTE — Progress Notes (Signed)
[facility] MD Progress Note  02/02/2019 8:14 AM Carla Park  MRN:  [id number] Subjective:   Patient is reporting some degree of withdrawal her blood pressure is actually up indicating there probably is some opiate withdrawal in addition to alcohol withdrawal so we will address that she is not suicidal today can contract affect is little constricted. Principal Problem: Alcohol dependence/opiate dependence/depression Diagnosis: Active Problems:   MDD (major depressive disorder), severe ([place])  Total Time spent with patient: 20 minutes  Past Psychiatric History: as above  Past Medical History:  Past Medical History:  Diagnosis Date  . Anxiety   . Arthritis   . Chronic pain   . Closed fracture of anterior wall of left acetabulum ([place]) 11/11/2018  . Depression   . ETOH abuse   . Fibromyalgia   . Hep C w/ coma, chronic ([place])   . Hepatitis C   . Hypertension   . Opiate abuse, continuous ([place])    on Methadone currently  . Suicide attempt ([place])    cut wrist  . Vision abnormalities     Past Surgical History:  Procedure Laterality Date  . ABDOMINAL HYSTERECTOMY    . APPENDECTOMY    . CESAREAN SECTION    . CHOLECYSTECTOMY    . KNEE SURGERY    . LAPAROTOMY N/A 11/09/2018   Procedure: EXPLORATORY LAPAROTOMY with Cystotomy repair and exploration;  Surgeon: Rolm Bookbinder, MD;  Location: [place];  Service: General;  Laterality: N/A;  . THORACIC AORTIC ENDOVASCULAR STENT GRAFT N/A 11/11/2018   Procedure: THORACIC AORTIC ENDOVASCULAR STENT GRAFT;  Surgeon: Marty Heck, MD;  Location: [facility] OR;  Service: Vascular;  Laterality: N/A;   Family History:  Family History  Problem Relation Age of Onset  . Multiple sclerosis Mother   . Hypertension Father    Family Psychiatric  History: see eval Social History:  Social History   Substance and Sexual Activity  Alcohol Use Yes   Comment: 2-500 ml boxes daily at least     Social History   Substance and Sexual Activity  Drug Use No     Social History   Socioeconomic History  . Marital status: Married    Spouse name: Not on file  . Number of children: Not on file  . Years of education: Not on file  . Highest education level: Not on file  Occupational History  . Not on file  Social Needs  . Financial resource strain: Not on file  . Food insecurity    Worry: Not on file    Inability: Not on file  . Transportation needs    Medical: Not on file    Non-medical: Not on file  Tobacco Use  . Smoking status: Current Every Day Smoker    Packs/day: 1.00    Types: Cigarettes  . Smokeless tobacco: Never Used  Substance and Sexual Activity  . Alcohol use: Yes    Comment: 2-500 ml boxes daily at least  . Drug use: No  . Sexual activity: Yes    Birth control/protection: Surgical  Lifestyle  . Physical activity    Days per week: Not on file    Minutes per session: Not on file  . Stress: Not on file  Relationships  . Social Herbalist on phone: Not on file    Gets together: Not on file    Attends religious service: Not on file    Active member of club or organization: Not on file    Attends meetings of  clubs or organizations: Not on file    Relationship status: Not on file  Other Topics Concern  . Not on file  Social History Narrative   ** Merged History Encounter **       Additional Social History:                         Sleep: Fair  Appetite:  Fair  Current Medications: Current Facility-Administered Medications  Medication Dose Route Frequency Provider Last Rate Last Dose  . acetaminophen (TYLENOL) tablet 650 mg  650 mg Oral Q6H PRN Money, Gerlene Burdock, FNP      . alum & mag hydroxide-simeth (MAALOX/MYLANTA) 200-200-20 MG/5ML suspension 30 mL  30 mL Oral Q4H PRN Money, Feliz Beam B, FNP      . cloNIDine (CATAPRES) tablet 0.1 mg  0.1 mg Oral TID AC & HS Money, Feliz Beam B, FNP   0.1 mg at 02/02/19 3151  . cloNIDine (CATAPRES) tablet 0.2 mg  0.2 mg Oral TID Malvin Johns, MD      . DULoxetine  (CYMBALTA) DR capsule 60 mg  60 mg Oral BID Malvin Johns, MD   60 mg at 02/02/19 0801  . gabapentin (NEURONTIN) tablet 600 mg  600 mg Oral TID Money, Gerlene Burdock, FNP   600 mg at 02/02/19 0801  . hydrOXYzine (ATARAX/VISTARIL) tablet 25 mg  25 mg Oral Q6H PRN Money, Gerlene Burdock, FNP   25 mg at 02/02/19 0802  . ketorolac (TORADOL) injection 60 mg  60 mg Intramuscular Once Malvin Johns, MD      . levothyroxine (SYNTHROID) tablet 25 mcg  25 mcg Oral [address], FNP   25 mcg at 02/02/19 7616  . loperamide (IMODIUM) capsule 2-4 mg  2-4 mg Oral PRN Money, Gerlene Burdock, FNP      . LORazepam (ATIVAN) tablet 1 mg  1 mg Oral Q6H PRN Money, Gerlene Burdock, FNP   1 mg at 02/01/19 2126  . magnesium hydroxide (MILK OF MAGNESIA) suspension 30 mL  30 mL Oral Daily PRN Money, Gerlene Burdock, FNP      . methocarbamol (ROBAXIN) tablet 750 mg  750 mg Oral TID Malvin Johns, MD      . multivitamin with minerals tablet 1 tablet  1 tablet Oral Daily Money, Gerlene Burdock, FNP   1 tablet at 02/02/19 0801  . nicotine (NICODERM CQ - dosed in mg/24 hours) patch 21 mg  21 mg Transdermal Daily Cobos, Rockey Situ, MD   21 mg at 02/02/19 0801  . ondansetron (ZOFRAN-ODT) disintegrating tablet 4 mg  4 mg Oral Q6H PRN Money, Gerlene Burdock, FNP      . prenatal multivitamin tablet 1 tablet  1 tablet Oral Daily Malvin Johns, MD   1 tablet at 02/02/19 0801  . temazepam (RESTORIL) capsule 30 mg  30 mg Oral QHS Malvin Johns, MD   30 mg at 02/01/19 2126  . thiamine (B-1) injection 100 mg  100 mg Intramuscular Once Money, Feliz Beam B, FNP      . thiamine (VITAMIN B-1) tablet 100 mg  100 mg Oral Daily Money, Gerlene Burdock, FNP   100 mg at 02/02/19 0801  . traZODone (DESYREL) tablet 100 mg  100 mg Oral QHS PRN Money, Gerlene Burdock, FNP        Lab Results: No results found for this or any previous visit (from the past 48 hour(s)).  Blood Alcohol level:  Lab Results  Component Value Date   ETH 286 (H)  01/29/2019   ETH 306 (HH) 11/09/2018    Metabolic Disorder Labs: Lab  Results  Component Value Date   HGBA1C 4.6 (L) 01/22/2018   MPG 85.32 01/22/2018   MPG 91.06 09/14/2017   Lab Results  Component Value Date   PROLACTIN 20.2 01/22/2018   Lab Results  Component Value Date   CHOL 222 (H) 01/22/2018   TRIG 146 11/15/2018   HDL 119 01/22/2018   CHOLHDL 1.9 01/22/2018   VLDL 15 01/22/2018   LDLCALC 88 01/22/2018   LDLCALC 105 (H) 09/14/2017    Physical Findings: AIMS: Facial and Oral Movements Muscles of Facial Expression: None, normal Lips and Perioral Area: None, normal Jaw: None, normal Tongue: None, normal,Extremity Movements Upper (arms, wrists, hands, fingers): None, normal Lower (legs, knees, ankles, toes): None, normal, Trunk Movements Neck, shoulders, hips: None, normal, Overall Severity Severity of abnormal movements (highest score from questions above): None, normal Incapacitation due to abnormal movements: None, normal Patient's awareness of abnormal movements (rate only patient's report): No Awareness, Dental Status Current problems with teeth and/or dentures?: No Does patient usually wear dentures?: No  CIWA:  CIWA-Ar Total: 4 COWS:     Musculoskeletal: Strength & Muscle Tone: within normal limits Gait & Station: normal Patient leans: N/A  Psychiatric Specialty Exam: Physical Exam  ROS  Blood pressure (!) 138/120, pulse (!) 126, temperature 97.8 F (36.6 C), temperature source Oral, resp. rate 20, height 5\' 2"  (1.575 m), weight 59 kg, last menstrual period 11/18/2018, SpO2 95 %.Body mass index is 23.79 kg/m.  General Appearance: Casual  Eye Contact:  Good  Speech:  Clear and Coherent  Volume:  Normal  Mood:  Dysphoric  Affect:  Appropriate  Thought Process:  Coherent  Orientation:  Full (Time, Place, and Person)  Thought Content:  Logical and Tangential  Suicidal Thoughts:  No  Homicidal Thoughts:  No  Memory:  Immediate;   Good  Judgement:  Good  Insight:  Good  Psychomotor Activity:  Normal  Concentration:   Concentration: Fair  Recall:  Good  Fund of Knowledge:  Fair  Language: nl  Akathisia:  Negative  Handed:  Right  AIMS (if indicated):     Assets:  Leisure Time Physical Health  ADL's:  Intact  Cognition:  WNL  Sleep:  Number of Hours: 5.25     Treatment Plan Summary: Daily contact with patient to assess and evaluate symptoms and progress in treatment Add Catapres for detox regimen continue alcohol detox continue antidepressant therapy and groups Lugene Hitt, MD 02/02/2019, 8:14 AM

## 2019-02-02 NOTE — Progress Notes (Signed)
Nursing Note: 0700-1900  D:  Pt presents with depressed/anxious mood and blunted/depressed affect, speech remain soft, slow and slurred at times.  Pt slept intermittently throughout shift and asked for meds each time she awoke. CIWA ranges 1-6, BP elevated 154/102- Clonidine 0.2mg  PO ordered TID.  A:  Encouraged to verbalize needs and concerns, active listening and support provided.  Continued Q 15 minute safety checks.  Observed active participation in group settings.  R:  Pt. is quiet and cooperative. Denies A/V hallucinations and is able to verbally contract for safety.

## 2019-02-02 NOTE — Progress Notes (Signed)
[place] NOVEL CORONAVIRUS (COVID-19) DAILY CHECK-OFF SYMPTOMS - answer yes or no to each - every day NO YES  Have you had a fever in the past 24 hours?  . Fever (Temp > 37.80C / 100F) X   Have you had any of these symptoms in the past 24 hours? . New Cough .  Sore Throat  .  Shortness of Breath .  Difficulty Breathing .  Unexplained Body Aches   X   Have you had any one of these symptoms in the past 24 hours not related to allergies?   . Runny Nose .  Nasal Congestion .  Sneezing   X   If you have had runny nose, nasal congestion, sneezing in the past 24 hours, has it worsened?  X   EXPOSURES - check yes or no X   Have you traveled outside the state in the past 14 days?  X   Have you been in contact with someone with a confirmed diagnosis of COVID-19 or PUI in the past 14 days without wearing appropriate PPE?  X   Have you been living in the same home as a person with confirmed diagnosis of COVID-19 or a PUI (household contact)?    X   Have you been diagnosed with COVID-19?    X              What to do next: Answered NO to all: Answered YES to anything:   Proceed with unit schedule Follow the [facility] Inpatient Flowsheet.   

## 2019-02-03 MED ORDER — HYDROXYZINE HCL 25 MG PO TABS
25.0000 mg | ORAL_TABLET | Freq: Three times a day (TID) | ORAL | Status: DC | PRN
  Administered 2019-02-03 – 2019-02-04 (×3): 25 mg via ORAL
  Filled 2019-02-03 (×3): qty 1

## 2019-02-03 NOTE — Progress Notes (Signed)
Patient denied SI and HI, contracts for safety.  Denied A/V hallucinations.   Medications administered per MD orders.  Emotional support and encouragement given patient. Safety maintained with 15 minute checks.  

## 2019-02-03 NOTE — Plan of Care (Signed)
Nurse discussed anxiety, depression and coping skills with patient.  

## 2019-02-03 NOTE — Progress Notes (Signed)
Carla Park observe watching TV, Pt stays to herself. She denies SI/HI/AVH at present. Her chief c/o is pain. PRN tylenol not due at this time; heat packs was offered but declined. Support offered. Will continue with POC.

## 2019-02-03 NOTE — Plan of Care (Signed)
  Problem: Education: Goal: Ability to state activities that reduce stress will improve Outcome: Progressing   Problem: Self-Concept: Goal: Ability to identify factors that promote anxiety will improve Outcome: Progressing   Problem: Activity: Goal: Interest or engagement in leisure activities will improve Outcome: Progressing   Problem: Health Behavior/Discharge Planning: Goal: Compliance with therapeutic regimen will improve Outcome: Progressing

## 2019-02-03 NOTE — [facility] Suicide Risk Assessment (Signed)
[place] INPATIENT:  Family/Significant Other Suicide Prevention Education  Suicide Prevention Education:  Contact Attempts: friend, Sabas Sous (roommate) [phone] has been identified by the patient as the family member/significant other with whom the patient will be residing, and identified as the person(s) who will aid the patient in the event of a mental health crisis.  With written consent from the patient, two attempts were made to provide suicide prevention education, prior to and/or following the patient's discharge.  We were unsuccessful in providing suicide prevention education.  A suicide education pamphlet was given to the patient to share with family/significant other.  Date and time of first attempt: 02/03/2019 at 2:40pm. No option to leave a voicemail. Date and time of second attempt: to be attempted at a later date  Joellen Jersey 02/03/2019, 2:41 PM

## 2019-02-03 NOTE — Progress Notes (Addendum)
Nursing Progress Note: 7p-7a D:Pt currently presents with a depressed/anxious affect and behavior. Pt sts, "I feel like I haven't been getting any better." Interacting appropriatelywith the milieu. Pt reports goodsleep during the previous night with current medication regimen.  A:Pt provided with medications per providers orders. Pt's labs and vitals were monitored throughout the night. Pt supported emotionally and encouraged to express concerns and questions. Pt educated on medications.  R:Pt's safety ensured with 15 minute and environmental checks. Pt currently denies SI, HI, and AVH. Pt verbally contracts to seek staff if SI,HI, or AVH occurs and to consult with staff before acting on any harmful thoughts. Will continue to monitor.    [place] NOVEL CORONAVIRUS (COVID-19) DAILY CHECK-OFF SYMPTOMS - answer yes or no to each - every day NO YES  Have you had a fever in the past 24 hours?  . Fever (Temp > 37.80C / 100F) X   Have you had any of these symptoms in the past 24 hours? . New Cough .  Sore Throat  .  Shortness of Breath .  Difficulty Breathing .  Unexplained Body Aches   X   Have you had any one of these symptoms in the past 24 hours not related to allergies?   . Runny Nose .  Nasal Congestion .  Sneezing   X   If you have had runny nose, nasal congestion, sneezing in the past 24 hours, has it worsened?  X   EXPOSURES - check yes or no X   Have you traveled outside the state in the past 14 days?  X   Have you been in contact with someone with a confirmed diagnosis of COVID-19 or PUI in the past 14 days without wearing appropriate PPE?  X   Have you been living in the same home as a person with confirmed diagnosis of COVID-19 or a PUI (household contact)?    X   Have you been diagnosed with COVID-19?    X              What to do next: Answered NO to all: Answered YES to anything:   Proceed with unit schedule Follow the [facility] Inpatient Flowsheet.

## 2019-02-03 NOTE — Progress Notes (Signed)
[place] NOVEL CORONAVIRUS (COVID-19) DAILY CHECK-OFF SYMPTOMS - answer yes or no to each - every day NO YES  Have you had a fever in the past 24 hours?  . Fever (Temp > 37.80C / 100F) X   Have you had any of these symptoms in the past 24 hours? . New Cough .  Sore Throat  .  Shortness of Breath .  Difficulty Breathing .  Unexplained Body Aches   X   Have you had any one of these symptoms in the past 24 hours not related to allergies?   . Runny Nose .  Nasal Congestion .  Sneezing   X   If you have had runny nose, nasal congestion, sneezing in the past 24 hours, has it worsened?  X   EXPOSURES - check yes or no X   Have you traveled outside the state in the past 14 days?  X   Have you been in contact with someone with a confirmed diagnosis of COVID-19 or PUI in the past 14 days without wearing appropriate PPE?  X   Have you been living in the same home as a person with confirmed diagnosis of COVID-19 or a PUI (household contact)?    X   Have you been diagnosed with COVID-19?    X              What to do next: Answered NO to all: Answered YES to anything:   Proceed with unit schedule Follow the [facility] Inpatient Flowsheet.   

## 2019-02-03 NOTE — Progress Notes (Signed)
[facility] MD Progress Note  02/03/2019 12:50 PM Carla Park  MRN:  [id number] Subjective:   Patient in the day room alert and oriented cooperative affect a little constricted states she wants to stay 1 more day in order to be sure of her recovery she has some cravings still.  No thoughts of harming self or others today Principal Problem: Opiate and alcohol dependency chronic depression  Diagnosis: Active Problems:   MDD (major depressive disorder), severe (HCC)  Total Time spent with patient: 20 minutes  Past Medical History:  Past Medical History:  Diagnosis Date  . Anxiety   . Arthritis   . Chronic pain   . Closed fracture of anterior wall of left acetabulum (HCC) 11/11/2018  . Depression   . ETOH abuse   . Fibromyalgia   . Hep C w/ coma, chronic (HCC)   . Hepatitis C   . Hypertension   . Opiate abuse, continuous (HCC)    on Methadone currently  . Suicide attempt (HCC)    cut wrist  . Vision abnormalities     Past Surgical History:  Procedure Laterality Date  . ABDOMINAL HYSTERECTOMY    . APPENDECTOMY    . CESAREAN SECTION    . CHOLECYSTECTOMY    . KNEE SURGERY    . LAPAROTOMY N/A 11/09/2018   Procedure: EXPLORATORY LAPAROTOMY with Cystotomy repair and exploration;  Surgeon: Emelia Loron, MD;  Location: [facility] OR;  Service: General;  Laterality: N/A;  . THORACIC AORTIC ENDOVASCULAR STENT GRAFT N/A 11/11/2018   Procedure: THORACIC AORTIC ENDOVASCULAR STENT GRAFT;  Surgeon: Cephus Shelling, MD;  Location: [facility] OR;  Service: Vascular;  Laterality: N/A;   Family History:  Family History  Problem Relation Age of Onset  . Multiple sclerosis Mother   . Hypertension Father     Social History:  Social History   Substance and Sexual Activity  Alcohol Use Yes   Comment: 2-500 ml boxes daily at least     Social History   Substance and Sexual Activity  Drug Use No    Social History   Socioeconomic History  . Marital status: Married    Spouse name: Not on file  .  Number of children: Not on file  . Years of education: Not on file  . Highest education level: Not on file  Occupational History  . Not on file  Social Needs  . Financial resource strain: Not on file  . Food insecurity    Worry: Not on file    Inability: Not on file  . Transportation needs    Medical: Not on file    Non-medical: Not on file  Tobacco Use  . Smoking status: Current Every Day Smoker    Packs/day: 1.00    Types: Cigarettes  . Smokeless tobacco: Never Used  Substance and Sexual Activity  . Alcohol use: Yes    Comment: 2-500 ml boxes daily at least  . Drug use: No  . Sexual activity: Yes    Birth control/protection: Surgical  Lifestyle  . Physical activity    Days per week: Not on file    Minutes per session: Not on file  . Stress: Not on file  Relationships  . Social Musician on phone: Not on file    Gets together: Not on file    Attends religious service: Not on file    Active member of club or organization: Not on file    Attends meetings of clubs or organizations: Not on  file    Relationship status: Not on file  Other Topics Concern  . Not on file  Social History Narrative   ** Merged History Encounter **       Additional Social History:                         Sleep: Good  Appetite:  Good  Current Medications: Current Facility-Administered Medications  Medication Dose Route Frequency Provider Last Rate Last Dose  . acetaminophen (TYLENOL) tablet 650 mg  650 mg Oral Q6H PRN Money, Lowry Ram, FNP   650 mg at 02/03/19 1209  . alum & mag hydroxide-simeth (MAALOX/MYLANTA) 200-200-20 MG/5ML suspension 30 mL  30 mL Oral Q4H PRN Money, Lowry Ram, FNP      . DULoxetine (CYMBALTA) DR capsule 60 mg  60 mg Oral BID Johnn Hai, MD   60 mg at 02/03/19 0815  . gabapentin (NEURONTIN) tablet 600 mg  600 mg Oral TID Money, Lowry Ram, FNP   600 mg at 02/03/19 1208  . hydrOXYzine (ATARAX/VISTARIL) tablet 25 mg  25 mg Oral TID PRN Johnn Hai,  MD      . levothyroxine (SYNTHROID) tablet 25 mcg  25 mcg Oral [address] FNP   25 mcg at 02/03/19 [phone]  . magnesium hydroxide (MILK OF MAGNESIA) suspension 30 mL  30 mL Oral Daily PRN Money, Lowry Ram, FNP      . methocarbamol (ROBAXIN) tablet 750 mg  750 mg Oral TID Johnn Hai, MD   750 mg at 02/03/19 1207  . multivitamin with minerals tablet 1 tablet  1 tablet Oral Daily Money, Lowry Ram, FNP   1 tablet at 02/02/19 0801  . nicotine (NICODERM CQ - dosed in mg/24 hours) patch 21 mg  21 mg Transdermal Daily Cobos, Myer Peer, MD   21 mg at 02/03/19 0814  . prenatal multivitamin tablet 1 tablet  1 tablet Oral Daily Johnn Hai, MD   1 tablet at 02/03/19 0815  . temazepam (RESTORIL) capsule 30 mg  30 mg Oral QHS Johnn Hai, MD   30 mg at 02/02/19 2110  . thiamine (B-1) injection 100 mg  100 mg Intramuscular Once Money, Darnelle Maffucci B, FNP      . thiamine (VITAMIN B-1) tablet 100 mg  100 mg Oral Daily Money, Lowry Ram, FNP   100 mg at 02/03/19 0815  . traZODone (DESYREL) tablet 100 mg  100 mg Oral QHS PRN Money, Lowry Ram, FNP        Lab Results: No results found for this or any previous visit (from the past 48 hour(s)).  Blood Alcohol level:  Lab Results  Component Value Date   ETH 286 (H) 01/29/2019   ETH 306 (HH) 47/42/5956    Metabolic Disorder Labs: Lab Results  Component Value Date   HGBA1C 4.6 (L) 01/22/2018   MPG 85.32 01/22/2018   MPG 91.06 09/14/2017   Lab Results  Component Value Date   PROLACTIN 20.2 01/22/2018   Lab Results  Component Value Date   CHOL 222 (H) 01/22/2018   TRIG 146 11/15/2018   HDL 119 01/22/2018   CHOLHDL 1.9 01/22/2018   VLDL 15 01/22/2018   LDLCALC 88 01/22/2018   LDLCALC 105 (H) 09/14/2017    Physical Findings: AIMS: Facial and Oral Movements Muscles of Facial Expression: None, normal Lips and Perioral Area: None, normal Jaw: None, normal Tongue: None, normal,Extremity Movements Upper (arms, wrists, hands, fingers): None,  normal Lower (legs, knees,  ankles, toes): None, normal, Trunk Movements Neck, shoulders, hips: None, normal, Overall Severity Severity of abnormal movements (highest score from questions above): None, normal Incapacitation due to abnormal movements: None, normal Patient's awareness of abnormal movements (rate only patient's report): No Awareness, Dental Status Current problems with teeth and/or dentures?: No Does patient usually wear dentures?: No  CIWA:  CIWA-Ar Total: 0 COWS:     Musculoskeletal: Strength & Muscle Tone: within normal limits Gait & Station: normal Patient leans: N/A  Psychiatric Specialty Exam: Physical Exam  ROS  Blood pressure 97/70, pulse (!) 102, temperature 98.2 F (36.8 C), temperature source Oral, resp. rate 18, height 5\' 2"  (1.575 m), weight 59 kg, last menstrual period 11/18/2018, SpO2 95 %.Body mass index is 23.79 kg/m.  General Appearance: Casual  Eye Contact:  Good  Speech:  Clear and Coherent  Volume:  Decreased  Mood:  Dysphoric  Affect:  Appropriate and Congruent  Thought Process: Coherent and intact associations  Orientation:  Full (Time, Place, and Person)  Thought Content:  Tangential  Suicidal Thoughts:  No  Homicidal Thoughts:  No  Memory:  Immediate;   Fair  Judgement:  Intact  Insight:  Fair  Psychomotor Activity:  Negative  Concentration:  Concentration: Fair  Recall:  Fair  Fund of Knowledge:  fair  Language:  Fair  Akathisia:  Negative  Handed:  Right  AIMS (if indicated):     Assets:  Social Support Talents/Skills  ADL's:  Intact  Cognition:  WNL  Sleep:  Number of Hours: 6.75     Treatment Plan Summary: Daily contact with patient to assess and evaluate symptoms and progress in treatment, Medication management and Plan Continue current cognitive therapy monitor for withdrawal no change in meds  Leighann Amadon, MD 02/03/2019, 12:50 PM

## 2019-02-04 MED ORDER — NALTREXONE HCL 50 MG PO TABS: 50.0000 mg | ORAL_TABLET | Freq: Every day | ORAL | 2 refills | Status: AC

## 2019-02-04 MED ORDER — TRAZODONE HCL 150 MG PO TABS: 150.0000 mg | ORAL_TABLET | Freq: Every evening | ORAL | 2 refills | Status: AC | PRN

## 2019-02-04 MED ORDER — DULOXETINE HCL 60 MG PO CPEP: 60.0000 mg | ORAL_CAPSULE | Freq: Two times a day (BID) | ORAL | 3 refills | Status: AC

## 2019-02-04 MED ORDER — GABAPENTIN 600 MG PO TABS: 600.0000 mg | ORAL_TABLET | Freq: Three times a day (TID) | ORAL | 1 refills | Status: AC
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
## 2019-02-04 NOTE — Progress Notes (Signed)
Adult Psychoeducational Group Note  Date:  02/04/2019 Time:  6:04 AM  Group Topic/Focus:  Wrap-Up Group:   The focus of this group is to help patients review their daily goal of treatment and discuss progress on daily workbooks.  Participation Level:  Minimal  Participation Quality:  Appropriate  Affect:  Appropriate  Cognitive:  Appropriate  Insight: Appropriate  Engagement in Group:  Engaged  Modes of Intervention:  Discussion  Additional Comments:  Pt rated her day 2/10. Her goal is to make it through withdrawals. Pt did want to know why she wasn't getting a medication for her high blood pressure and anxiety.  Wynelle Fanny R 02/04/2019, 6:04 AM

## 2019-02-04 NOTE — BHH Suicide Risk Assessment (Signed)
[place] INPATIENT:  Family/Significant Other Suicide Prevention Education  Suicide Prevention Education:  Education Completed; friend, Sabas Sous (roommate) [phone]  has been identified by the patient as the family member/significant other with whom the patient will be residing, and identified as the person(s) who will aid the patient in the event of a mental health crisis (suicidal ideations/suicide attempt).  With written consent from the patient, the family member/significant other has been provided the following suicide prevention education, prior to the and/or following the discharge of the patient.  The suicide prevention education provided includes the following:  Suicide risk factors  Suicide prevention and interventions  National Suicide Hotline telephone number  [facility] assessment telephone number  [facility] Emergency Assistance [place] and/or Residential Mobile Crisis Unit telephone number  Request made of family/significant other to:  Remove weapons (e.g., guns, rifles, knives), all items previously/currently identified as safety concern.    Remove drugs/medications (over-the-counter, prescriptions, illicit drugs), all items previously/currently identified as a safety concern.  The family member/significant other verbalizes understanding of the suicide prevention education information provided.  The family member/significant other agrees to remove the items of safety concern listed above.  No concerns for discharge other than inquiring if patient was to resume taking methadone again.   Joellen Jersey 02/04/2019, 9:39 AM

## 2019-02-04 NOTE — BHH Suicide Risk Assessment (Signed)
[facility] Discharge Suicide Risk Assessment   Principal Problem: Alcohol dependence/depression/chronic hepatitis C/opiate dependence Discharge Diagnoses: Active Problems:   MDD (major depressive disorder), severe ([place])   Total Time spent with patient: 45 minutes  Musculoskeletal: Strength & Muscle Tone: within normal limits Gait & Station: normal Patient leans: N/A  Psychiatric Specialty Exam: ROS  Blood pressure 116/76, pulse 90, temperature 97.6 F (36.4 C), temperature source Oral, resp. rate 18, height 5\' 2"  (1.575 m), weight 59 kg, last menstrual period 11/18/2018, SpO2 100 %.Body mass index is 23.79 kg/m.  General Appearance: Casual  Eye Contact::  Minimal  Speech:  Slow409  Volume:  Decreased  Mood:  Dysphoric  Affect:  Congruent  Thought Process:  Linear  Orientation:  Full (Time, Place, and Person)  Thought Content:  Logical  Suicidal Thoughts:  No  Homicidal Thoughts:  No  Memory:  Immediate;   Fair  Judgement:  Fair  Insight:  Fair  Psychomotor Activity:  Normal  Concentration:  Good  Recall:  Good  Fund of Knowledge:Good  Language: Good  Akathisia:  Negative  Handed:  Right  AIMS (if indicated):     Assets:  Physical Health Resilience  Sleep:  Number of Hours: 6.75  Cognition: WNL  ADL's:  Intact   Mental Status Per Nursing Assessment::   On Admission:  Suicidal ideation indicated by patient, Suicide plan  Demographic Factors:  Caucasian  Loss Factors: Decline in physical health  Historical Factors: NA  Risk Reduction Factors:   Religious beliefs about death  Continued Clinical Symptoms:  Dysthymia  Cognitive Features That Contribute To Risk:  None    Suicide Risk:  Minimal: No identifiable suicidal ideation.  Patients presenting with no risk factors but with morbid ruminations; may be classified as minimal risk based on the severity of the depressive symptoms  [place] Follow up on 02/08/2019.    Why: Please go to facility on Tuesday, 6/23 before 11:00a. to recieve your next dose of methadone.  Contact information: [place] [address] Ph: [phone] Fx: [phone]          Plan Of Care/Follow-up recommendations:  Activity:  full  Jabe Jeanbaptiste, MD 02/04/2019, 9:39 AM

## 2019-02-04 NOTE — Progress Notes (Signed)
  [facility] Adult Case Management Discharge Plan :  Will you be returning to the same living situation after discharge:  Yes,  home At discharge, do you have transportation home?: Yes,  friend will pick up by 2pm Do you have the ability to pay for your medications: Yes,  BCBS insurance  Release of information consent forms completed and in the chart. Patient to Follow up at: [facility] Follow up on 02/08/2019.   Why: Please go to facility on Tuesday, 6/23 before 11:00a. to recieve your next dose of methadone.  Contact information: [address]  [place] [address] Ph: [phone] Fx: [phone]          Next level of care provider has access to [place] and Suicide Prevention discussed: Yes,  with friend, Estill Bamberg  Have you used any form of tobacco in the last 30 days? (Cigarettes, Smokeless Tobacco, Cigars, and/or Pipes): Yes  Has patient been referred to the Quitline?: Patient refused referral  Patient has been referred for addiction treatment: Yes  Joellen Jersey, [place] 02/04/2019, 9:43 AM

## 2019-02-04 NOTE — Tx Team (Signed)
Interdisciplinary Treatment and Diagnostic Plan Update  02/04/2019 Time of Session: 9:00am [place]la [place] MRN: [id number]  Principal Diagnosis: <principal problem not specified>  Secondary Diagnoses: Active Problems:   MDD (major depressive disorder), severe (HCC)   Current Medications:  Current Facility-Administered Medications  Medication Dose Route Frequency Provider Last Rate Last Dose  . acetaminophen (TYLENOL) tablet 650 mg  650 mg Oral Q6H PRN Money, Gerlene Burdock, FNP   650 mg at 02/04/19 5329  . alum & mag hydroxide-simeth (MAALOX/MYLANTA) 200-200-20 MG/5ML suspension 30 mL  30 mL Oral Q4H PRN Money, Gerlene Burdock, FNP      . DULoxetine (CYMBALTA) DR capsule 60 mg  60 mg Oral BID Malvin Johns, MD   60 mg at 02/04/19 0809  . gabapentin (NEURONTIN) tablet 600 mg  600 mg Oral TID Money, Gerlene Burdock, FNP   600 mg at 02/04/19 9242  . hydrOXYzine (ATARAX/VISTARIL) tablet 25 mg  25 mg Oral TID PRN Malvin Johns, MD   25 mg at 02/04/19 6834  . levothyroxine (SYNTHROID) tablet 25 mcg  25 mcg Oral Q0600 Money, Gerlene Burdock, FNP   25 mcg at 02/04/19 1962  . magnesium hydroxide (MILK OF MAGNESIA) suspension 30 mL  30 mL Oral Daily PRN Money, Gerlene Burdock, FNP      . methocarbamol (ROBAXIN) tablet 750 mg  750 mg Oral TID Malvin Johns, MD   750 mg at 02/04/19 0809  . multivitamin with minerals tablet 1 tablet  1 tablet Oral Daily Money, Gerlene Burdock, FNP   1 tablet at 02/04/19 0809  . nicotine (NICODERM CQ - dosed in mg/24 hours) patch 21 mg  21 mg Transdermal Daily Cobos, Rockey Situ, MD   21 mg at 02/04/19 0815  . prenatal multivitamin tablet 1 tablet  1 tablet Oral Daily Malvin Johns, MD   1 tablet at 02/04/19 0809  . temazepam (RESTORIL) capsule 30 mg  30 mg Oral QHS Malvin Johns, MD   30 mg at 02/03/19 2109  . thiamine (B-1) injection 100 mg  100 mg Intramuscular Once Money, Feliz Beam B, FNP      . thiamine (VITAMIN B-1) tablet 100 mg  100 mg Oral Daily Money, Gerlene Burdock, FNP   100 mg at 02/04/19 0809  . traZODone  (DESYREL) tablet 100 mg  100 mg Oral QHS PRN Money, Gerlene Burdock, FNP   100 mg at 02/03/19 2109   PTA Medications: Medications Prior to Admission  Medication Sig Dispense Refill Last Dose  . acetaminophen (TYLENOL) 325 MG tablet Take 2 tablets (650 mg total) by mouth every 4 (four) hours as needed for mild pain or fever.     Marland Kitchen albuterol (VENTOLIN HFA) 108 (90 Base) MCG/ACT inhaler Inhale 1-2 puffs into the lungs every 6 (six) hours as needed for wheezing or shortness of breath.     . ARIPiprazole (ABILIFY) 10 MG tablet Take 1 tablet (10 mg total) by mouth daily. For mood control 15 tablet 0   . ARIPiprazole ER (ABILIFY MAINTENA) 400 MG SRER injection Inject 2 mLs (400 mg total) into the muscle every 28 (twenty-eight) days. (Due on 02-22-18): For mood control 1 each 0   . cloNIDine (CATAPRES) 0.1 MG tablet Take 1 tablet (0.1 mg total) by mouth 4 (four) times daily - after meals and at bedtime. For high blood pressure & anxiety symptoms 60 tablet 0   . cloNIDine (CATAPRES) 0.1 MG tablet Take 0.1 mg by mouth 4 (four) times daily as needed (high blood pressure and anxiety.).      Marland Kitchen  docusate sodium (COLACE) 100 MG capsule Take 1 capsule (100 mg total) by mouth 2 (two) times daily. 10 capsule 0   . DULoxetine (CYMBALTA) 20 MG capsule Take 60 mg by mouth 2 (two) times daily.     . DULoxetine 40 MG CPEP Take 40 mg by mouth daily. For depression 30 capsule 0   . enoxaparin (LOVENOX) 40 MG/0.4ML injection Inject 0.4 mLs (40 mg total) into the skin daily for 14 days. 5.6 mL 0   . etodolac (LODINE) 200 MG capsule Take 2 capsules (400 mg total) by mouth 2 (two) times daily. For pain management  0   . gabapentin (NEURONTIN) 300 MG capsule Take 2 capsules (600 mg total) by mouth 3 (three) times daily. For agitation 180 capsule 0   . HUMIRA PEN 40 MG/0.4ML PNKT Inject 40 mg into the skin every 14 (fourteen) days.     . hydrOXYzine (ATARAX/VISTARIL) 50 MG tablet Take 1 tablet (50 mg total) by mouth every 6 (six) hours  as needed for anxiety. 60 tablet 0   . levothyroxine (SYNTHROID, LEVOTHROID) 25 MCG tablet Take 1 tablet (25 mcg total) by mouth daily before breakfast. For thyroid hormone replacement     . levothyroxine (SYNTHROID, LEVOTHROID) 25 MCG tablet Take 0.025 mcg by mouth daily before breakfast.     . lidocaine (LIDODERM) 5 % Place 1 patch onto the skin daily. Remove & Discard patch within 12 hours or as directed by MD     . methadone (DOLOPHINE) 1 MG/1ML solution Take 95 mg by mouth daily.     . methadone (DOLOPHINE) 10 MG tablet Take 17 tablets (170 mg total) by mouth daily. For opioid addiction 1 tablet 0   . methadone (DOLOPHINE) 10 MG tablet Take 7 tablets (70 mg total) by mouth daily. You must go to your methadone clinic for prescription.  0   . Multiple Vitamin (MULTIVITAMIN WITH MINERALS) TABS tablet Take 1 tablet by mouth daily. (May purchase from over the counter): Vitamin supplement (Patient not taking: Reported on 01/29/2019)     . Multiple Vitamin (MULTIVITAMIN WITH MINERALS) TABS tablet Take 1 tablet by mouth daily.     . nicotine (NICODERM CQ - DOSED IN MG/24 HOURS) 21 mg/24hr patch Place 1 patch (21 mg total) onto the skin daily. (May purchase from over the counter): For smoking cessation (Patient not taking: Reported on 01/29/2019) 28 patch 0   . oxyCODONE (OXY IR/ROXICODONE) 5 MG immediate release tablet Take 1 tablet (5 mg total) by mouth every 6 (six) hours as needed for moderate pain or severe pain. 25 tablet 0   . polyethylene glycol (MIRALAX / GLYCOLAX) 17 g packet Take 17 g by mouth daily. 14 each 0   . tiZANidine (ZANAFLEX) 2 MG tablet Take 2 mg by mouth 2 (two) times a day.     . traZODone (DESYREL) 100 MG tablet Take 1 tablet (100 mg total) by mouth at bedtime. For sleep 30 tablet 0   . traZODone (DESYREL) 100 MG tablet Take 100 mg by mouth at bedtime as needed for sleep.       Patient Stressors: Health problems Marital or family conflict Substance abuse  Patient Strengths:  Ability for insight Average or above average intelligence Motivation for treatment/growth  Treatment Modalities: Medication Management, Group therapy, Case management,  1 to 1 session with clinician, Psychoeducation, Recreational therapy.   Physician Treatment Plan for Primary Diagnosis: <principal problem not specified> Long Term Goal(s): Improvement in symptoms so as ready for  discharge Improvement in symptoms so as ready for discharge   Short Term Goals: Ability to identify changes in lifestyle to reduce recurrence of condition will improve Ability to disclose and discuss suicidal ideas Ability to demonstrate self-control will improve Ability to identify and develop effective coping behaviors will improve Ability to maintain clinical measurements within normal limits will improve Compliance with prescribed medications will improve  Medication Management: Evaluate patient's response, side effects, and tolerance of medication regimen.  Therapeutic Interventions: 1 to 1 sessions, Unit Group sessions and Medication administration.  Evaluation of Outcomes: Adequate for Discharge  Physician Treatment Plan for Secondary Diagnosis: Active Problems:   MDD (major depressive disorder), severe (HCC)  Long Term Goal(s): Improvement in symptoms so as ready for discharge Improvement in symptoms so as ready for discharge   Short Term Goals: Ability to identify changes in lifestyle to reduce recurrence of condition will improve Ability to disclose and discuss suicidal ideas Ability to demonstrate self-control will improve Ability to identify and develop effective coping behaviors will improve Ability to maintain clinical measurements within normal limits will improve Compliance with prescribed medications will improve     Medication Management: Evaluate patient's response, side effects, and tolerance of medication regimen.  Therapeutic Interventions: 1 to 1 sessions, Unit Group sessions and  Medication administration.  Evaluation of Outcomes: Adequate for Discharge   RN Treatment Plan for Primary Diagnosis: <principal problem not specified> Long Term Goal(s): Knowledge of disease and therapeutic regimen to maintain health will improve  Short Term Goals: Ability to verbalize feelings will improve, Ability to identify and develop effective coping behaviors will improve and Compliance with prescribed medications will improve  Medication Management: RN will administer medications as ordered by provider, will assess and evaluate patient's response and provide education to patient for prescribed medication. RN will report any adverse and/or side effects to prescribing provider.  Therapeutic Interventions: 1 on 1 counseling sessions, Psychoeducation, Medication administration, Evaluate responses to treatment, Monitor vital signs and CBGs as ordered, Perform/monitor CIWA, COWS, AIMS and Fall Risk screenings as ordered, Perform wound care treatments as ordered.  Evaluation of Outcomes: Adequate for Discharge   LCSW Treatment Plan for Primary Diagnosis: <principal problem not specified> Long Term Goal(s): Safe transition to appropriate next level of care at discharge, Engage patient in therapeutic group addressing interpersonal concerns.  Short Term Goals: Engage patient in aftercare planning with referrals and resources, Increase social support, Increase emotional regulation, Identify triggers associated with mental health/substance abuse issues and Increase skills for wellness and recovery  Therapeutic Interventions: Assess for all discharge needs, 1 to 1 time with Social worker, Explore available resources and support systems, Assess for adequacy in community support network, Educate family and significant other(s) on suicide prevention, Complete Psychosocial Assessment, Interpersonal group therapy.  Evaluation of Outcomes: Adequate for Discharge   Progress in Treatment: Attending  groups: Yes.  Participating in groups: Yes. Taking medication as prescribed: Yes. Toleration medication: Yes. Family/Significant other contact made: Yes, individual(s) contacted:  friend, Marchelle Folksmanda Patient understands diagnosis: Yes. Discussing patient identified problems/goals with staff: Yes. Medical problems stabilized or resolved: No. Denies suicidal/homicidal ideation: Yes. Issues/concerns per patient self-inventory: Yes.  New problem(s) identified: Yes, Describe:  Recent separation from spouse, upcoming court dates, family stressors  New Short Term/Long Term Goal(s): detox, medication management for mood stabilization; elimination of SI thoughts; development of comprehensive mental wellness/sobriety plan.  Patient Goals:    Discharge Plan or Barriers: Discharging home, [facility] in [place], [place].  Reason for  Continuation of Hospitalization: Anxiety Depression  Estimated Length of Stay: today  Attendees: Patient: 02/04/2019 9:41 AM  Physician:  02/04/2019 9:41 AM  Nursing:  02/04/2019 9:41 AM  RN Care Manager: 02/04/2019 9:41 AM  Social Worker: Stephanie Acre, [place] 02/04/2019 9:41 AM  Recreational Therapist:  02/04/2019 9:41 AM  Other:  02/04/2019 9:41 AM  Other:  02/04/2019 9:41 AM  Other: 02/04/2019 9:41 AM    Scribe for Treatment Team: Joellen Jersey, [place] 02/04/2019 9:41 AM
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
## 2019-02-04 NOTE — Progress Notes (Signed)
Recreation Therapy Notes  Date:  6.19.20 Time: 0930 Location: 300 [address] Dayroom  Group Topic: Stress Management  Goal Area(s) Addresses:  Patient will identify positive stress management techniques. Patient will identify benefits of using stress management post d/c.  Intervention: Stress Management  Activity :  Progressive Muscle Relaxation.  LRT introduced the stress management technique of progressive muscle relaxation.  LRT lead patients in tensing each muscle group individually then releasing it.  Patients were to follow along as LRT lead them through the exercise.  Education:  Stress Management, Discharge Planning.   Education Outcome: Acknowledges Education  Clinical Observations/Feedback: Pt did not attend group.     Victorino Sparrow, LRT/CTRS     Ria Comment, Jessicah Croll A 02/04/2019 11:08 AM
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
## 2023-06-19 DEATH — deceased
